# Patient Record
Sex: Male | Born: 1975 | Race: White | Hispanic: No | Smoking: Current every day smoker
Health system: Southern US, Community
[De-identification: ages and names within clinical notes are randomized; demographics above are authoritative.]

## PROBLEM LIST (undated history)

## (undated) DIAGNOSIS — R519 Headache, unspecified: Secondary | ICD-10-CM

## (undated) DIAGNOSIS — F419 Anxiety disorder, unspecified: Secondary | ICD-10-CM

## (undated) DIAGNOSIS — F329 Major depressive disorder, single episode, unspecified: Secondary | ICD-10-CM

## (undated) DIAGNOSIS — F32A Depression, unspecified: Secondary | ICD-10-CM

## (undated) DIAGNOSIS — M542 Cervicalgia: Secondary | ICD-10-CM

## (undated) DIAGNOSIS — R51 Headache: Secondary | ICD-10-CM

## (undated) HISTORY — DX: Anxiety disorder, unspecified: F41.9

## (undated) HISTORY — PX: ORIF CARPAL BONE FRACTURE: SUR922

## (undated) HISTORY — PX: TONSILLECTOMY AND ADENOIDECTOMY: SUR1326

## (undated) HISTORY — DX: Depression, unspecified: F32.A

## (undated) HISTORY — DX: Headache, unspecified: R51.9

## (undated) HISTORY — DX: Headache: R51

## (undated) HISTORY — DX: Cervicalgia: M54.2

## (undated) HISTORY — PX: MELANOMA EXCISION: SHX5266

## (undated) HISTORY — DX: Major depressive disorder, single episode, unspecified: F32.9

---

## 1999-06-22 ENCOUNTER — Encounter: Payer: Self-pay | Admitting: Emergency Medicine

## 1999-06-22 ENCOUNTER — Emergency Department (HOSPITAL_COMMUNITY): Admission: EM | Admit: 1999-06-22 | Discharge: 1999-06-22 | Payer: Self-pay | Admitting: Emergency Medicine

## 1999-10-18 ENCOUNTER — Emergency Department (HOSPITAL_COMMUNITY): Admission: EM | Admit: 1999-10-18 | Discharge: 1999-10-19 | Payer: Self-pay | Admitting: Internal Medicine

## 1999-10-18 ENCOUNTER — Encounter: Payer: Self-pay | Admitting: Internal Medicine

## 1999-10-21 ENCOUNTER — Ambulatory Visit (HOSPITAL_BASED_OUTPATIENT_CLINIC_OR_DEPARTMENT_OTHER): Admission: RE | Admit: 1999-10-21 | Discharge: 1999-10-21 | Payer: Self-pay | Admitting: Orthopedic Surgery

## 2000-03-27 ENCOUNTER — Emergency Department (HOSPITAL_COMMUNITY): Admission: EM | Admit: 2000-03-27 | Discharge: 2000-03-27 | Payer: Self-pay | Admitting: Emergency Medicine

## 2005-09-08 ENCOUNTER — Emergency Department: Payer: Self-pay | Admitting: Unknown Physician Specialty

## 2010-10-01 ENCOUNTER — Emergency Department: Payer: Self-pay | Admitting: Unknown Physician Specialty

## 2010-10-01 ENCOUNTER — Ambulatory Visit: Payer: Self-pay | Admitting: Family Medicine

## 2012-10-18 ENCOUNTER — Ambulatory Visit: Payer: Self-pay | Admitting: Family Medicine

## 2014-02-19 ENCOUNTER — Ambulatory Visit: Payer: Self-pay | Admitting: Family Medicine

## 2014-02-19 LAB — DOT URINE DIP
Blood: NEGATIVE
Glucose,UR: NEGATIVE mg/dL (ref 0–75)
Protein: NEGATIVE
Specific Gravity: 1.01 (ref 1.003–1.030)

## 2015-05-01 ENCOUNTER — Telehealth: Payer: Self-pay

## 2015-05-01 ENCOUNTER — Other Ambulatory Visit: Payer: Self-pay

## 2015-05-01 DIAGNOSIS — F419 Anxiety disorder, unspecified: Secondary | ICD-10-CM

## 2015-05-01 MED ORDER — FLUOXETINE HCL 10 MG PO CAPS: 10.0000 mg | ORAL_CAPSULE | Freq: Every day | ORAL | Status: DC

## 2015-05-01 MED ORDER — FLUOXETINE HCL 20 MG PO TABS: 20.0000 mg | ORAL_TABLET | Freq: Every day | ORAL | Status: DC

## 2015-05-01 MED ORDER — FLUOXETINE HCL 20 MG PO CAPS: 20.0000 mg | ORAL_CAPSULE | Freq: Every day | ORAL | Status: DC

## 2015-05-01 NOTE — Telephone Encounter (Signed)
Patient Last OV 07/2013. Had Fluoxetine Refill 05/2014 with 2 refills attached by Dr. Juanetta Gosling. He now wants refill again. I advised he needs OV as we have not seen him in almost 2 years. He said he can come next week but needs Rx because he is out.o said he has no reason to follow up. I advised that we do need to follow up regular for this issue and medication. He would like Dr to consider 1 month of refill.[facility]

## 2015-05-01 NOTE — Telephone Encounter (Signed)
Sent in  and 

## 2015-05-01 NOTE — Telephone Encounter (Signed)
Send in 14 Fluoxetine tabs at the dose he is currently taking (? 20 mg).  I need to see him within the next 2 weeks.  No further refills until seen.-jh

## 2015-05-09 ENCOUNTER — Encounter: Payer: Self-pay | Admitting: Family Medicine

## 2015-05-12 ENCOUNTER — Encounter: Payer: Self-pay | Admitting: Family Medicine

## 2015-05-12 ENCOUNTER — Ambulatory Visit (INDEPENDENT_AMBULATORY_CARE_PROVIDER_SITE_OTHER): Payer: BLUE CROSS/BLUE SHIELD | Admitting: Family Medicine

## 2015-05-12 VITALS — BP 145/92 | HR 74 | Temp 98.3°F | Resp 16 | Ht 71.5 in | Wt 181.0 lb

## 2015-05-12 DIAGNOSIS — F32A Depression, unspecified: Secondary | ICD-10-CM

## 2015-05-12 DIAGNOSIS — F329 Major depressive disorder, single episode, unspecified: Secondary | ICD-10-CM

## 2015-05-12 DIAGNOSIS — L600 Ingrowing nail: Secondary | ICD-10-CM

## 2015-05-12 DIAGNOSIS — F419 Anxiety disorder, unspecified: Secondary | ICD-10-CM | POA: Diagnosis not present

## 2015-05-12 MED ORDER — FLUOXETINE HCL 20 MG PO CAPS: 20.0000 mg | ORAL_CAPSULE | Freq: Every day | ORAL | Status: DC

## 2015-05-12 NOTE — Patient Instructions (Signed)
Soak R. Great toe in warm water with Epsom salts 1-2 times a day and keep nail trimmed as close as possible.  WEill try loweer dose of Prozac as he is doin g so well.

## 2015-05-12 NOTE — Progress Notes (Signed)
Name: Joshua Leblanc.   MRN: [id number]    DOB: 06-16-1976   Date:05/12/2015       Progress Note  Subjective  Chief Complaint  Chief Complaint  Patient presents with  . Anxiety    HPI  For f/u of anxiety with some depressive component.  Hs not been see here for 22 mos.  Was getting Rx fill for entire time until. Last week with limited suppl;y until office visit.   Feels great on 30 mg. Prozac/day.  Separated and going towards divorce.  He has not been on lower dose since he was placed on 30 mg.  Feels 7-8/10.   C/o a recurrent ingrown toenail on R great toe.  No problem-specific assessment & plan notes found for this encounter.   Past Medical History  Diagnosis Date  . Depression   . Anxiety   . Headache   . Neck pain     Social History  Substance Use Topics  . Smoking status: Current Every Day Smoker -- 1.00 packs/day for 15 years    Types: Cigarettes  . Smokeless tobacco: Never Used  . Alcohol Use: 0.0 oz/week    0 Standard drinks or equivalent per week     Comment: OCCASIONAL     Current outpatient prescriptions:  .  FLUoxetine (PROZAC) 10 MG capsule, Take 1 capsule (10 mg total) by mouth daily., Disp: 14 capsule, Rfl: 0 .  FLUoxetine (PROZAC) 20 MG capsule, Take 1 capsule (20 mg total) by mouth daily., Disp: 14 capsule, Rfl: 0  Allergies  Allergen Reactions  . Codeine Nausea Only  . Xanax [Alprazolam]     Black out and memory lost    Review of Systems  Constitutional: Negative for fever, chills, weight loss and malaise/fatigue.  HENT: Negative for hearing loss.   Eyes: Negative for blurred vision and double vision.  Respiratory: Negative for cough, sputum production, shortness of breath and wheezing.   Cardiovascular: Negative for chest pain, palpitations, orthopnea and leg swelling.  Gastrointestinal: Negative for heartburn, abdominal pain and blood in stool.  Genitourinary: Negative for dysuria, urgency and frequency.  Musculoskeletal: Negative  for myalgias and joint pain.  Skin: Negative for rash.  Neurological: Negative for dizziness, sensory change, focal weakness, weakness and headaches.  Psychiatric/Behavioral: Negative for depression. The patient is not nervous/anxious and does not have insomnia.       Objective  Filed Vitals:   05/12/15 1552  BP: 145/92  Pulse: 74  Temp: 98.3 F (36.8 C)  TempSrc: Oral  Resp: 16  Height: 5' 11.5" (1.816 m)  Weight: 181 lb (82.101 kg)     Physical Exam  Constitutional: He is well-developed, well-nourished, and in no distress. No distress.  HENT:  Head: Normocephalic and atraumatic.  Eyes: Conjunctivae and EOM are normal. Pupils are equal, round, and reactive to light. No scleral icterus.  Neck: Normal range of motion. Neck supple. No thyromegaly present.  Cardiovascular: Normal rate, regular rhythm, normal heart sounds and intact distal pulses.  Exam reveals no gallop and no friction rub.   No murmur heard. Pulmonary/Chest: Effort normal and breath sounds normal. No respiratory distress. He has no wheezes. He has no rales.  Abdominal: Soft. Bowel sounds are normal. He exhibits no distension and no mass. There is no tenderness.  Lymphadenopathy:    He has no cervical adenopathy.  Skin:  Ingrown toenail of R. Great toe without infection.  Psychiatric:  Affect very good.  Feels overall 7-8 / 10.  Vitals reviewed.  No results found for this or any previous visit (from the past 2160 hour(s)).   Assessment & Plan  1. Acute anxiety  - FLUoxetine (PROZAC) 20 MG capsule; Take 1 capsule (20 mg total) by mouth daily.  Dispense: 30 capsule; Refill: 6  2. Depression  - FLUoxetine (PROZAC) 20 MG capsule; Take 1 capsule (20 mg total) by mouth daily.  Dispense: 30 capsule; Refill: 6  3. Ingrown right greater toenail  - Ambulatory referral to Podiatry

## 2015-06-01 ENCOUNTER — Encounter: Payer: Self-pay | Admitting: Emergency Medicine

## 2015-06-01 ENCOUNTER — Emergency Department
Admission: EM | Admit: 2015-06-01 | Discharge: 2015-06-01 | Disposition: A | Discharge status: Home / Self Care | Payer: BLUE CROSS/BLUE SHIELD | Attending: Emergency Medicine | Admitting: Emergency Medicine

## 2015-06-01 ENCOUNTER — Emergency Department: Payer: BLUE CROSS/BLUE SHIELD

## 2015-06-01 DIAGNOSIS — Z79899 Other long term (current) drug therapy: Secondary | ICD-10-CM | POA: Insufficient documentation

## 2015-06-01 DIAGNOSIS — Z720 Tobacco use: Secondary | ICD-10-CM | POA: Insufficient documentation

## 2015-06-01 DIAGNOSIS — Y998 Other external cause status: Secondary | ICD-10-CM | POA: Insufficient documentation

## 2015-06-01 DIAGNOSIS — Y9371 Activity, boxing: Secondary | ICD-10-CM | POA: Diagnosis not present

## 2015-06-01 DIAGNOSIS — Y9289 Other specified places as the place of occurrence of the external cause: Secondary | ICD-10-CM | POA: Diagnosis not present

## 2015-06-01 DIAGNOSIS — W500XXA Accidental hit or strike by another person, initial encounter: Secondary | ICD-10-CM | POA: Diagnosis not present

## 2015-06-01 DIAGNOSIS — S6991XA Unspecified injury of right wrist, hand and finger(s), initial encounter: Secondary | ICD-10-CM | POA: Diagnosis present

## 2015-06-01 DIAGNOSIS — S62326A Displaced fracture of shaft of fifth metacarpal bone, right hand, initial encounter for closed fracture: Secondary | ICD-10-CM | POA: Diagnosis not present

## 2015-06-01 DIAGNOSIS — S62309A Unspecified fracture of unspecified metacarpal bone, initial encounter for closed fracture: Secondary | ICD-10-CM

## 2015-06-01 MED ORDER — OXYCODONE-ACETAMINOPHEN 5-325 MG PO TABS
1.0000 | ORAL_TABLET | Freq: Four times a day (QID) | ORAL | Status: DC | PRN
Start: 2015-06-01 — End: 2015-09-25

## 2015-06-01 MED ORDER — IBUPROFEN 800 MG PO TABS: 800.0000 mg | ORAL_TABLET | Freq: Three times a day (TID) | ORAL | Status: DC | PRN

## 2015-06-01 NOTE — ED Notes (Signed)
Pt states was horse playing last night when he punched him in the back. Woke up today with swelling and pain to R hand.

## 2015-06-01 NOTE — ED Provider Notes (Signed)
[facility] Emergency Department Provider Note ____________________________________________  Time seen: Approximately 12:39 PM  I have reviewed the triage vital signs and the nursing notes.   HISTORY  Chief Complaint Hand Injury   HPI Joshua Leblanc. is a 39 y.o. male who presents to the emergency department for evaluation of right hand pain. He and his little brother were "slap boxing" and he now has pain in his right hand. He had a previous boxer's fracture about "18 years ago" and had a metal plate inserted. He states they also "took out 2 of my knuckles." He has not taken anything for pain today.   Past Medical History  Diagnosis Date  . Depression   . Anxiety   . Headache   . Neck pain     There are no active problems to display for this patient.   Past Surgical History  Procedure Laterality Date  . Melanoma excision    . Orif carpal bone fracture      Current Outpatient Rx  Name  Route  Sig  Dispense  Refill  . FLUoxetine (PROZAC) 20 MG capsule   Oral   Take 1 capsule (20 mg total) by mouth daily.   30 capsule   6   . ibuprofen (ADVIL,MOTRIN) 800 MG tablet   Oral   Take 1 tablet (800 mg total) by mouth every 8 (eight) hours as needed.   30 tablet   0   . oxyCODONE-acetaminophen (ROXICET) 5-325 MG tablet   Oral   Take 1 tablet by mouth every 6 (six) hours as needed.   12 tablet   0     Allergies Codeine and Xanax  Family History  Problem Relation Age of Onset  . Hypertension Mother   . Cancer Father     melanoma    Social History Social History  Substance Use Topics  . Smoking status: Current Every Day Smoker -- 1.00 packs/day for 15 years    Types: Cigarettes  . Smokeless tobacco: Never Used  . Alcohol Use: 0.0 oz/week    0 Standard drinks or equivalent per week     Comment: OCCASIONAL    Review of Systems Constitutional: No recent illness. Eyes: No visual changes. ENT: No sore  throat. Cardiovascular: Denies chest pain or palpitations. Respiratory: Denies shortness of breath. Gastrointestinal: No abdominal pain.  Genitourinary: Negative for dysuria. Musculoskeletal: Pain in right hand Skin: Negative for rash. Neurological: Negative for headaches, focal weakness or numbness. 10-point ROS otherwise negative.  ____________________________________________   PHYSICAL EXAM:  VITAL SIGNS: ED Triage Vitals  Enc Vitals Group     BP 06/01/15 1228 153/91 mmHg     Pulse Rate 06/01/15 1228 87     Resp 06/01/15 1228 18     Temp 06/01/15 1228 98.2 F (36.8 C)     Temp Source 06/01/15 1228 Oral     SpO2 06/01/15 1228 95 %     Weight 06/01/15 1228 182 lb (82.555 kg)     Height 06/01/15 1228  (1.803 m)     Head Cir --      Peak Flow --      Pain Score 06/01/15 1229 8     Pain Loc --      Pain Edu? --      Excl. in GC? --     Constitutional: Alert and oriented. Well appearing and in no acute distress. Eyes: Conjunctivae are normal. EOMI. Head: Atraumatic. Nose: No congestion/rhinnorhea. Neck: No stridor.  Respiratory: Normal respiratory effort.   Musculoskeletal: Deformity noted to the right 5th metacarpal area. Patient now unable to extend 5th digit straight out as before last night's injury.  Neurologic:  Normal speech and language. No gross focal neurologic deficits are appreciated. Speech is normal. No gait instability. Skin:  Skin is warm, dry and intact. Contusion noted to the lateral aspect of the right hand and palmar aspect of the right hand. Psychiatric: Mood and affect are normal. Speech and behavior are normal.  ____________________________________________   LABS (all labs ordered are listed, but only abnormal results are displayed)  Labs Reviewed - No data to display ____________________________________________  RADIOLOGY  Fracture of the proximal head of the 5th  metacarpal as well as the proximal shaft on the right hand.  I, Kem Boroughsari  Dacota Ruben, personally viewed and evaluated these images (plain radiographs) as part of my medical decision making.   ____________________________________________   PROCEDURES  Procedure(s) performed:   SPLINT APPLICATION Date/Time: 2:01 PM Authorized by: Kem Boroughsari Keagen Heinlen Consent: Verbal consent obtained. Risks and benefits: risks, benefits and alternatives were discussed Consent given by: patient Splint applied by: Mellody DanceKeith, ER technician Location details: right hand Splint type: Boxer's  Supplies used: OCL and ACE Post-procedure: The splinted body part was neurovascularly unchanged following the procedure. Patient tolerance: Patient tolerated the procedure well with no immediate complications.      ____________________________________________   INITIAL IMPRESSION / ASSESSMENT AND PLAN / ED COURSE  Pertinent labs & imaging results that were available during my care of the patient were reviewed by me and considered in my medical decision making (see chart for details).  Patient was advised to follow up with orthopedics .Marland Kitchen. He was advised to return to the emergency department for symptoms that change or worsen if unable to schedule an appointment with the primary care provider or specialist. ____________________________________________   FINAL CLINICAL IMPRESSION(S) / ED DIAGNOSES  Final diagnoses:  Metacarpal bone fracture, closed, initial encounter       Chinita PesterCari B Shadi Sessler, FNP 06/01/15 1401  Governor Rooksebecca Lord, MD 06/01/15 1536

## 2015-07-21 ENCOUNTER — Telehealth: Payer: Self-pay | Admitting: Family Medicine

## 2015-07-21 NOTE — Telephone Encounter (Signed)
Patient informed that he would need an office visit to discuss increase of medication. Patient states he is going out of town and will call back to schedule.

## 2015-07-21 NOTE — Telephone Encounter (Signed)
Pt called requesting that we  Increase his fluoxetine to 30 mg , [facility]  [place]   Pt  Call back  # is [phone].

## 2015-07-21 NOTE — Telephone Encounter (Signed)
I would need to see him if he thinks he needs a higher dose of this medicine.-jh

## 2015-08-02 ENCOUNTER — Other Ambulatory Visit: Payer: Self-pay | Admitting: Family Medicine

## 2015-08-28 ENCOUNTER — Ambulatory Visit: Payer: BLUE CROSS/BLUE SHIELD | Admitting: Family Medicine

## 2015-09-01 ENCOUNTER — Ambulatory Visit: Payer: BLUE CROSS/BLUE SHIELD | Admitting: Family Medicine

## 2015-09-25 ENCOUNTER — Encounter: Payer: Self-pay | Admitting: Family Medicine

## 2015-09-25 ENCOUNTER — Ambulatory Visit (INDEPENDENT_AMBULATORY_CARE_PROVIDER_SITE_OTHER): Payer: BLUE CROSS/BLUE SHIELD | Admitting: Family Medicine

## 2015-09-25 VITALS — BP 132/86 | HR 59 | Temp 98.0°F | Resp 16 | Wt 180.0 lb

## 2015-09-25 DIAGNOSIS — F411 Generalized anxiety disorder: Secondary | ICD-10-CM

## 2015-09-25 MED ORDER — FLUOXETINE HCL 20 MG PO TABS: 20.0000 mg | ORAL_TABLET | Freq: Every day | ORAL | Status: DC

## 2015-09-25 MED ORDER — FLUOXETINE HCL 10 MG PO CAPS
ORAL_CAPSULE | ORAL | Status: DC
Start: 2015-09-25 — End: 2023-01-06

## 2015-09-25 NOTE — Progress Notes (Signed)
Name: Joshua Leblanc [facility].   MRN: [id number]    DOB: Dec 13, 1975   Date:09/25/2015       Progress Note  Subjective  Chief Complaint  Chief Complaint  Patient presents with  . Follow-up    Medications  . Anxiety    HPI Here for f/o of anxiety and agitation.   Has been on lower dose of Prozac (20 mg now) for 3-4 n months..,  Has been more anxious and agitated since we went down from 30 mg.  He did much  better on 30 mg/d.   No problem-specific assessment & plan notes found for this encounter.   Past Medical History  Diagnosis Date  . Depression   . Anxiety   . Headache   . Neck pain     Past Surgical History  Procedure Laterality Date  . Melanoma excision    . Orif carpal bone fracture      Family History  Problem Relation Age of Onset  . Hypertension Mother   . Cancer Father     melanoma    Social History   Social History  . Marital Status: Single    Spouse Name: N/A  . Number of Children: N/A  . Years of Education: N/A   Occupational History  . Not on file.   Social History Main Topics  . Smoking status: Current Every Day Smoker -- 1.00 packs/day for 15 years    Types: Cigarettes  . Smokeless tobacco: Never Used  . Alcohol Use: 0.0 oz/week    0 Standard drinks or equivalent per week     Comment: OCCASIONAL  . Drug Use: No     Comment: FORMER X15 AGO  . Sexual Activity: Not on file     Comment: I   Other Topics Concern  . Not on file   Social History Narrative     Current outpatient prescriptions:  .  FLUoxetine (PROZAC) 20 MG tablet, Take 1 tablet (20 mg total) by mouth daily., Disp: 90 tablet, Rfl: 3 .  FLUoxetine (PROZAC) 10 MG capsule, Take 1 capsule daily, Disp: 90 capsule, Rfl: 3  Allergies  Allergen Reactions  . Codeine Nausea Only  . Xanax [Alprazolam]     Black out and memory lost     Review of Systems  Constitutional: Negative for fever, chills, weight loss and malaise/fatigue.  HENT: Negative for hearing loss.   Eyes:  Negative for blurred vision and double vision.  Respiratory: Negative for cough, shortness of breath and wheezing.   Cardiovascular: Negative for chest pain, palpitations and leg swelling.  Gastrointestinal: Negative for heartburn, abdominal pain and blood in stool.  Genitourinary: Negative for dysuria, urgency and frequency.  Skin: Negative for rash.  Neurological: Negative for weakness and headaches.  Psychiatric/Behavioral: The patient is nervous/anxious (increased agitation).       Objective  Filed Vitals:   09/25/15 1559  BP: 132/86  Pulse: 59  Temp: 98 F (36.7 C)  TempSrc: Oral  Resp: 16  Weight: 180 lb (81.647 kg)    Physical Exam  Constitutional: He is oriented to person, place, and time and well-developed, well-nourished, and in no distress. No distress.  HENT:  Head: Normocephalic and atraumatic.  Eyes: Conjunctivae and EOM are normal. Pupils are equal, round, and reactive to light. No scleral icterus.  Neck: Normal range of motion. Neck supple. Carotid bruit is not present. No thyromegaly present.  Cardiovascular: Normal rate, regular rhythm and normal heart sounds.  Exam reveals no gallop and no  friction rub.   No murmur heard. Pulmonary/Chest: Effort normal and breath sounds normal. No respiratory distress. He has no wheezes. He has no rales.  Abdominal: Soft. Bowel sounds are normal. He exhibits no distension and no mass. There is no tenderness.  Musculoskeletal: He exhibits no edema.  Lymphadenopathy:    He has no cervical adenopathy.  Neurological: He is alert and oriented to person, place, and time.  Psychiatric:  Affect is mildly anxious  Vitals reviewed.      No results found for this or any previous visit (from the past 2160 hour(s)).   Assessment & Plan  Problem List Items Addressed This Visit      Other   Anxiety, generalized - Primary   Relevant Medications   FLUoxetine (PROZAC) 20 MG tablet   FLUoxetine (PROZAC) 10 MG capsule       Meds ordered this encounter  Medications  . FLUoxetine (PROZAC) 20 MG tablet    Sig: Take 1 tablet (20 mg total) by mouth daily.    Dispense:  90 tablet    Refill:  3  . FLUoxetine (PROZAC) 10 MG capsule    Sig: Take 1 capsule daily    Dispense:  90 capsule    Refill:  3    Total daily dose is 30 mg/d   1. Anxiety, generalized-with agitation - FLUoxetine (PROZAC) 20 MG tablet; Take 1 tablet (20 mg total) by mouth daily.  Dispense: 90 tablet; Refill: 3 - FLUoxetine (PROZAC) 10 MG capsule; Take 1 capsule daily  Dispense: 90 capsule; Refill: 3

## 2015-12-01 ENCOUNTER — Ambulatory Visit: Payer: BLUE CROSS/BLUE SHIELD | Admitting: Family Medicine

## 2016-01-28 ENCOUNTER — Ambulatory Visit (INDEPENDENT_AMBULATORY_CARE_PROVIDER_SITE_OTHER): Payer: BLUE CROSS/BLUE SHIELD

## 2016-01-28 ENCOUNTER — Encounter: Payer: Self-pay | Admitting: Emergency Medicine

## 2016-01-28 ENCOUNTER — Ambulatory Visit
Admission: EM | Admit: 2016-01-28 | Discharge: 2016-01-28 | Disposition: A | Discharge status: Home / Self Care | Payer: BLUE CROSS/BLUE SHIELD | Attending: Family Medicine | Admitting: Family Medicine

## 2016-01-28 DIAGNOSIS — S92902A Unspecified fracture of left foot, initial encounter for closed fracture: Secondary | ICD-10-CM

## 2016-01-28 MED ORDER — OXYCODONE-ACETAMINOPHEN 5-325 MG PO TABS: 1.0000 | ORAL_TABLET | Freq: Three times a day (TID) | ORAL | Status: DC | PRN

## 2016-01-28 MED ORDER — IBUPROFEN 800 MG PO TABS: 800.0000 mg | ORAL_TABLET | Freq: Three times a day (TID) | ORAL | Status: DC | PRN

## 2016-01-28 NOTE — Discharge Instructions (Signed)
Take medication as prescribed. Rest. Drink plenty of fluids. Apply ice and elevate. Keep in splint and use crutches. Do not drive or operate machinery while taking pain medication.   Follow up podiatry this week as discussed.   Follow up with your primary care physician this week as needed. Return to Urgent care for new or worsening concerns.

## 2016-01-28 NOTE — ED Provider Notes (Signed)
[facility]  ____________________________________________  Time seen: Approximately 8:28 PM  I have reviewed the triage vital signs and the nursing notes.   HISTORY  Chief Complaint Foot Pain   HPI Joshua CanaryRobert Lester Stecklein Leblanc. is a 40 y.o. male presents with a complaint of left foot pain since this morning. Patient reports that he has a few steps going out his front door. Patient states that when he got to about the third step, he missed stepped, causing himself to roll his left foot. Patient states that he did fall but was able to catch himself. Denies any other pain or injury. Denies loss of consciousness or head injury.  Patient reports that he has had continued left foot pain since the injury. Patient states that pain is to the top of his foot and to the inside of his foot. Denies any pain radiation. Denies any numbness or tingling sensation. Patient states pain is worse with ambulating or direct palpation. Reports mild swelling. Denies histories of issues to the same foot.  Denies head injury, loss of consciousness, neck pain or injury, back pain or injury, other extremity pain or injury, dizziness, weakness, recent sickness or recent immobilization.  Joshua LevyJames Hawkins Jr, MD PCP   Past Medical History  Diagnosis Date  . Depression   . Anxiety   . Headache   . Neck pain     Patient Active Problem List   Diagnosis Date Noted  . Anxiety, generalized 09/25/2015    Past Surgical History  Procedure Laterality Date  . Melanoma excision    . Orif carpal bone fracture      Current Outpatient Rx  Name  Route  Sig  Dispense  Refill  . FLUoxetine (PROZAC) 10 MG capsule      Take 1 capsule daily   90 capsule   3     Total daily dose is 30 mg/d   . FLUoxetine (PROZAC) 20 MG tablet   Oral   Take 1 tablet (20 mg total) by mouth daily.   90 tablet   3     Allergies Codeine and Xanax Per patient : Codeine makes him nauseated. Denies any other issues at other pain  medication. Family History  Problem Relation Age of Onset  . Hypertension Mother   . Cancer Father     melanoma    Social History Social History  Substance Use Topics  . Smoking status: Current Every Day Smoker -- 1.00 packs/day for 15 years    Types: Cigarettes  . Smokeless tobacco: Never Used  . Alcohol Use: No     Comment: former    Review of Systems Constitutional: No fever/chills Eyes: No visual changes. ENT: No sore throat. Cardiovascular: Denies chest pain. Respiratory: Denies shortness of breath. Gastrointestinal: No abdominal pain.  No nausea, no vomiting.  No diarrhea.  No constipation. Genitourinary: Negative for dysuria. Musculoskeletal: Negative for back pain. As above.  Skin: Negative for rash. Neurological: Negative for headaches, focal weakness or numbness.  10-point ROS otherwise negative.  ____________________________________________   PHYSICAL EXAM:  VITAL SIGNS: ED Triage Vitals  Enc Vitals Group     BP 01/28/16 1829 132/91 mmHg     Pulse Rate 01/28/16 1829 85     Resp 01/28/16 1829 18     Temp 01/28/16 1829 97.2 F (36.2 C)     Temp Source 01/28/16 1829 Tympanic     SpO2 01/28/16 1829 100 %     Weight 01/28/16 1829 185 lb (83.915 kg)  Height 01/28/16 1829 5\' 11"  (1.803 m)     Head Cir --      Peak Flow --      Pain Score 01/28/16 1828 9     Pain Loc --      Pain Edu? --      Excl. in GC? --     Constitutional: Alert and oriented. Well appearing and in no acute distress. Eyes: Conjunctivae are normal. PERRL. EOMI. Head: Atraumatic.  Ears: normal external appearance bilaterally.   Nose: No congestion/rhinnorhea.  Mouth/Throat: Mucous membranes are moist.  Neck: No stridor.  No cervical spine tenderness to palpation. Cardiovascular: Normal rate, regular rhythm. Grossly normal heart sounds.  Good peripheral circulation. Respiratory: Normal respiratory effort.  No retractions. Lungs CTAB. No wheezes, rales or rhonchi.   Musculoskeletal: No lower or upper extremity tenderness nor edema.  No cervical, thoracic, or lumbar tenderness to palpation. Bilateral pedal pulses equal and easily palpated.  Except: left dorsal midfoot and medial midfoot moderate tenderness to palpation with mild ecchymosis and swelling, full range of motion, sensation intact, neurovascular intact, left lower extremity otherwise nontender, gait not tested due to pain.  Neurologic:  Normal speech and language. No gross focal neurologic deficits are appreciated.  Skin:  Skin is warm, dry and intact. No rash noted. Psychiatric: Mood and affect are normal. Speech and behavior are normal.  ____________________________________________   LABS (all labs ordered are listed, but only abnormal results are displayed)  Labs Reviewed - No data to display  RADIOLOGY  Dg Foot Complete Left  01/28/2016  CLINICAL DATA:  Left medial foot pain with bruising. Stepped down wrong when coming out door this morning. EXAM: LEFT FOOT - COMPLETE 3+ VIEW COMPARISON:  None. FINDINGS: Favor artifactual lucency through the navicula and dorsal aspect of the cuneiform on the lateral view. Not visualized on other images. Otherwise, no fracture dislocation identified. IMPRESSION: Lucencies through the navicula and cuneiforms on the lateral view are favored to be artifactual. Correlate with point tenderness. Electronically Signed   By: Jeronimo Greaves M.D.   On: 01/28/2016 20:37   ____________________________________________   PROCEDURES  Procedure(s) performed:   Crutches given. Posterior OCL left foot splint applied by CMA, neurovascular intact post application. ____________________________________________   INITIAL IMPRESSION / ASSESSMENT AND PLAN / ED COURSE  Pertinent labs & imaging results that were available during my care of the patient were reviewed by me and considered in my medical decision making (see chart for details).  Patient presents for the  complaints of left foot pain post mechanical injury today. Denies any other pain or injury. Reports has continued to ambulate but with pain. Patient with dorsal mid to proximal foot pain and tenderness as well as medial proximal foot tenderness. Will evaluate x-ray.  Per radiologist lucencies through the navicula and cuneiforms in the lateral view are favored to be artifactual, correlate with point tenderness. Patient reexamined and patient is point tender in these areas and reviewed x-ray by myself. Suspect acute fracture. Discussed this with patient who verbalized understanding. Patient placed in posterior OCL splint and given crutches. When necessary ibuprofen and Percocet. 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 Directed do not operate any heavy machinery while taking pain medication. Instructed to apply ice and elevate and use crutches. Patient states he does not want to be taken out of work but states he needed a note stating that he needs to use crutches for the next 2 days. Patient states  that on Monday he he will be able to sit down at work. Patient directed to follow-up with podiatry in the next week.  Patient also requests information for orthopedic as he states that when he was approximately 40 years old he had a boxer's fracture that was repaired with hardware. Patient states that he is concerned that he may have a screw that is backing out of the hardware and request a follow-up with orthopedic. Patient declines wanting this complaint evaluated in urgent care at this time and declines pain associated with this. Information for Dr. Joice Lofts orthopedic given.  Discussed follow up with Primary care physician this week. Discussed follow up and return parameters including no resolution or any worsening concerns. Patient verbalized understanding and agreed to plan.   ____________________________________________   FINAL CLINICAL IMPRESSION(S) / ED DIAGNOSES  Final  diagnoses:  Foot fracture, left, closed, initial encounter     Discharge Medication List as of 01/28/2016  9:27 PM    START taking these medications   Details  ibuprofen (ADVIL,MOTRIN) 800 MG tablet Take 1 tablet (800 mg total) by mouth every 8 (eight) hours as needed for mild pain or moderate pain., Starting 01/28/2016, Until Discontinued, Print    oxyCODONE-acetaminophen (ROXICET) 5-325 MG tablet Take 1 tablet by mouth every 8 (eight) hours as needed for moderate pain or severe pain (Do not drive or operate heavy machinery while taking as can cause drowsiness.)., Starting 01/28/2016, Until Discontinued, Print        Note: This dictation was prepared with Dragon dictation along with smaller phrase technology. Any transcriptional errors that result from this process are unintentional.       Renford Dills, NP 01/29/16 2219

## 2016-01-28 NOTE — ED Notes (Signed)
Left foot pain, Stepped down wrong when coming out door.

## 2017-05-01 ENCOUNTER — Emergency Department: Payer: Medicaid Other

## 2017-05-01 ENCOUNTER — Emergency Department
Admission: EM | Admit: 2017-05-01 | Discharge: 2017-05-01 | Disposition: A | Discharge status: Home / Self Care | Payer: Medicaid Other | Attending: Emergency Medicine | Admitting: Emergency Medicine

## 2017-05-01 ENCOUNTER — Encounter: Payer: Self-pay | Admitting: Emergency Medicine

## 2017-05-01 DIAGNOSIS — Y929 Unspecified place or not applicable: Secondary | ICD-10-CM | POA: Diagnosis not present

## 2017-05-01 DIAGNOSIS — Z79899 Other long term (current) drug therapy: Secondary | ICD-10-CM | POA: Insufficient documentation

## 2017-05-01 DIAGNOSIS — Y9389 Activity, other specified: Secondary | ICD-10-CM | POA: Diagnosis not present

## 2017-05-01 DIAGNOSIS — S91111A Laceration without foreign body of right great toe without damage to nail, initial encounter: Secondary | ICD-10-CM

## 2017-05-01 DIAGNOSIS — S9031XA Contusion of right foot, initial encounter: Secondary | ICD-10-CM | POA: Diagnosis not present

## 2017-05-01 DIAGNOSIS — Y999 Unspecified external cause status: Secondary | ICD-10-CM | POA: Insufficient documentation

## 2017-05-01 DIAGNOSIS — W230XXA Caught, crushed, jammed, or pinched between moving objects, initial encounter: Secondary | ICD-10-CM | POA: Insufficient documentation

## 2017-05-01 DIAGNOSIS — S99921A Unspecified injury of right foot, initial encounter: Secondary | ICD-10-CM | POA: Diagnosis present

## 2017-05-01 DIAGNOSIS — Z23 Encounter for immunization: Secondary | ICD-10-CM | POA: Insufficient documentation

## 2017-05-01 DIAGNOSIS — F17210 Nicotine dependence, cigarettes, uncomplicated: Secondary | ICD-10-CM | POA: Diagnosis not present

## 2017-05-01 DIAGNOSIS — S91211A Laceration without foreign body of right great toe with damage to nail, initial encounter: Secondary | ICD-10-CM | POA: Insufficient documentation

## 2017-05-01 MED ORDER — CEPHALEXIN 500 MG PO CAPS: 500.0000 mg | ORAL_CAPSULE | Freq: Four times a day (QID) | ORAL | 0 refills | Status: DC

## 2017-05-01 MED ORDER — MELOXICAM 15 MG PO TABS: 15.0000 mg | ORAL_TABLET | Freq: Every day | ORAL | 0 refills | Status: DC

## 2017-05-01 MED ORDER — LIDOCAINE HCL (PF) 1 % IJ SOLN
15.0000 mL | Freq: Once | INTRAMUSCULAR | Status: AC
  Administered 2017-05-01: 15 mL
  Filled 2017-05-01: qty 15

## 2017-05-01 MED ORDER — OXYCODONE-ACETAMINOPHEN 5-325 MG PO TABS
1.0000 | ORAL_TABLET | Freq: Once | ORAL | Status: AC
  Administered 2017-05-01: 1 via ORAL
  Filled 2017-05-01: qty 1

## 2017-05-01 MED ORDER — ONDANSETRON 8 MG PO TBDP
8.0000 mg | ORAL_TABLET | Freq: Once | ORAL | Status: AC
  Administered 2017-05-01: 8 mg via ORAL
  Filled 2017-05-01: qty 1

## 2017-05-01 MED ORDER — HYDROCODONE-ACETAMINOPHEN 5-325 MG PO TABS: 1.0000 | ORAL_TABLET | ORAL | 0 refills | Status: DC | PRN

## 2017-05-01 MED ORDER — TETANUS-DIPHTH-ACELL PERTUSSIS 5-2.5-18.5 LF-MCG/0.5 IM SUSP
0.5000 mL | Freq: Once | INTRAMUSCULAR | Status: AC
  Administered 2017-05-01: 0.5 mL via INTRAMUSCULAR
  Filled 2017-05-01: qty 0.5

## 2017-05-01 NOTE — ED Notes (Signed)
Right great toe cleansed with NS, gauze applied and wrapped in Kerlex.  Pt given referral to follow up with PCP for suture removal. Discussed wound care. Pt verbalized understanding.  Pt ambulatory to lobby without difficulty.

## 2017-05-01 NOTE — ED Notes (Signed)
Pt reports that he injured his right great toe - pt states that the toenail was pulled off and he reports that he can see the bone in right great toe - pt injured it while moving a wood stove today

## 2017-05-01 NOTE — ED Triage Notes (Signed)
Patient presents to ED via POV from home. Patient states he was moving a wood burning stove when he dropped it on his toe. Patient states it weighed about 300 pounds. Patient talking on phone during triage.

## 2017-05-01 NOTE — ED Provider Notes (Signed)
[facility] Emergency Department Provider Note  ____________________________________________  Time seen: Approximately 4:52 PM  I have reviewed the triage vital signs and the nursing notes.   HISTORY  Chief Complaint Toe Injury    HPI Joshua Leblanc. is a 41 y.o. male who presents to emergency department complaining of great toe injury to the right foot. Patient reports that he was moving a heavy wood burning stove when he accidentally dropped him on his foot. Patient reports that he is wearing flip-flops and sustained a laceration as well as small toenail avulsion to the great toe right foot. Patient wrapped area,presented to the emergency department. No medications prior to arrival. Last tetanus shot was greater an 10 years ago. No other injury or complaint at this time.   Past Medical History:  Diagnosis Date  . Anxiety   . Depression   . Headache   . Neck pain     Patient Active Problem List   Diagnosis Date Noted  . Anxiety, generalized 09/25/2015    Past Surgical History:  Procedure Laterality Date  . MELANOMA EXCISION    . ORIF CARPAL BONE FRACTURE      Prior to Admission medications   Medication Sig Start Date End Date Taking? Authorizing Provider  cephALEXin (KEFLEX) 500 MG capsule Take 1 capsule (500 mg total) by mouth 4 (four) times daily. 05/01/17   Cuthriell, Delorise Royals, PA-C  FLUoxetine (PROZAC) 10 MG capsule Take 1 capsule daily 09/25/15   Janeann Forehand., MD  FLUoxetine (PROZAC) 20 MG tablet Take 1 tablet (20 mg total) by mouth daily. 09/25/15   Janeann Forehand., MD  HYDROcodone-acetaminophen (NORCO/VICODIN) 5-325 MG tablet Take 1 tablet by mouth every 4 (four) hours as needed for moderate pain. 05/01/17   Cuthriell, Delorise Royals, PA-C  ibuprofen (ADVIL,MOTRIN) 800 MG tablet Take 1 tablet (800 mg total) by mouth every 8 (eight) hours as needed for mild pain or moderate pain. 01/28/16   Renford Dills, NP  meloxicam  (MOBIC) 15 MG tablet Take 1 tablet (15 mg total) by mouth daily. 05/01/17   Cuthriell, Delorise Royals, PA-C  oxyCODONE-acetaminophen (ROXICET) 5-325 MG tablet Take 1 tablet by mouth every 8 (eight) hours as needed for moderate pain or severe pain (Do not drive or operate heavy machinery while taking as can cause drowsiness.). 01/28/16   Renford Dills, NP    Allergies Codeine and Xanax [alprazolam]  Family History  Problem Relation Age of Onset  . Hypertension Mother   . Cancer Father        melanoma    Social History Social History  Substance Use Topics  . Smoking status: Current Every Day Smoker    Packs/day: 1.00    Years: 15.00    Types: Cigarettes  . Smokeless tobacco: Never Used  . Alcohol use No     Comment: former     Review of Systems  Constitutional: No fever/chills Cardiovascular: no chest pain. Respiratory: no cough. No SOB. Musculoskeletal: positive for injury to the great toe of the right foot Skin: Negative for rash, abrasions, lacerations, ecchymosis. Neurological: Negative for headaches, focal weakness or numbness. 10-point ROS otherwise negative.  ____________________________________________   PHYSICAL EXAM:  VITAL SIGNS: ED Triage Vitals  Enc Vitals Group     BP 05/01/17 1645 (!) 142/97     Pulse Rate 05/01/17 1644 (!) 108     Resp 05/01/17 1644 (!) 22     Temp 05/01/17 1644 98.2 F (36.8 C)  Temp src --      SpO2 05/01/17 1644 98 %     Weight 05/01/17 1644 185 lb (83.9 kg)     Height 05/01/17 1644  (1.778 m)     Head Circumference --      Peak Flow --      Pain Score 05/01/17 1651 9     Pain Loc --      Pain Edu? --      Excl. in GC? --      Constitutional: Alert and oriented. Well appearing and in no acute distress. Eyes: Conjunctivae are normal. PERRL. EOMI. Head: Atraumatic. Neck: No stridor.    Cardiovascular: Normal rate, regular rhythm. Normal S1 and S2.  Good peripheral circulation. Respiratory: Normal respiratory  effort without tachypnea or retractions. Lungs CTAB. Good air entry to the bases with no decreased or absent breath sounds. Musculoskeletal: Full range of motion to all extremities. No gross deformities appreciated.no gross deformity or amputation. 5 centimeter laceration noted to the MTP joint of the first digit dorsal aspect of the foot. Small laceration noted to the nail bed toenail.toenail still in place. No gross avulsion. Patient is tender to palpation along the metatarsal bone as well as both phalanxes of the great toe.no significant palpable abnormality. Good range of motion to the toe. Cap refill and sensation intact Neurologic:  Normal speech and language. No gross focal neurologic deficits are appreciated.  Skin:  Skin is warm, dry and intact. No rash noted. Psychiatric: Mood and affect are normal. Speech and behavior are normal. Patient exhibits appropriate insight and judgement.   ____________________________________________   LABS (all labs ordered are listed, but only abnormal results are displayed)  Labs Reviewed - No data to display ____________________________________________  EKG   ____________________________________________  RADIOLOGY Festus Barren Cuthriell, personally viewed and evaluated these images (plain radiographs) as part of my medical decision making, as well as reviewing the written report by the radiologist.  Dg Foot Complete Right  Result Date: 05/01/2017 CLINICAL DATA:  Patient reports he was attempting to move a wood stove today when it landed on his right foot. Reports stove fell primarily on big toe. Patient has laceration to dorsal surface of big toe. Hx removal of ingrown toe nail to big toe approx. 2 years ago, remote hx of right ankle fx. EXAM: RIGHT FOOT COMPLETE - 3+ VIEW COMPARISON:  None. FINDINGS: There is no evidence of fracture or dislocation. There is no evidence of arthropathy or other focal bone abnormality. Soft tissues are unremarkable.  IMPRESSION: Negative. Electronically Signed   By: Amie Portland M.D.   On: 05/01/2017 17:21    ____________________________________________    PROCEDURES  Procedure(s) performed:    Marland KitchenMarland KitchenLaceration Repair Date/Time: 05/01/2017 6:19 PM Performed by: Gala Romney D Authorized by: Gala Romney D   Consent:    Consent obtained:  Verbal   Consent given by:  Patient   Risks discussed:  Pain Anesthesia (see MAR for exact dosages):    Anesthesia method:  Local infiltration   Local anesthetic:  Lidocaine 1% w/o epi Laceration details:    Location:  Toe   Toe location:  R big toe   Length (cm):  5 Repair type:    Repair type:  Simple Pre-procedure details:    Preparation:  Imaging obtained to evaluate for foreign bodies Exploration:    Hemostasis achieved with:  Direct pressure   Wound exploration: wound explored through full range of motion and entire depth of wound probed and  visualized     Wound extent: no foreign bodies/material noted, no muscle damage noted, no nerve damage noted, no tendon damage noted, no underlying fracture noted and no vascular damage noted     Contaminated: no   Treatment:    Area cleansed with:  Betadine   Amount of cleaning:  Extensive   Irrigation solution:  Sterile saline   Irrigation volume:  1L   Irrigation method:  Syringe Skin repair:    Repair method:  Sutures   Suture size:  4-0   Suture material:  Nylon   Suture technique:  Simple interrupted   Number of sutures:  12 Approximation:    Approximation:  Close Post-procedure details:    Dressing:  Non-adherent dressing and tube gauze   Patient tolerance of procedure:  Tolerated well, no immediate complications      Medications  lidocaine (PF) (XYLOCAINE) 1 % injection 15 mL (not administered)  oxyCODONE-acetaminophen (PERCOCET/ROXICET) 5-325 MG per tablet 1 tablet (1 tablet Oral Given 05/01/17 1713)  ondansetron (ZOFRAN-ODT) disintegrating tablet 8 mg (8 mg Oral Given  05/01/17 1713)  Tdap (BOOSTRIX) injection 0.5 mL (0.5 mLs Intramuscular Given 05/01/17 1713)     ____________________________________________   INITIAL IMPRESSION / ASSESSMENT AND PLAN / ED COURSE  Pertinent labs & imaging results that were available during my care of the patient were reviewed by me and considered in my medical decision making (see chart for details).  Review of the [place] CSRS was performed in accordance of the [facility] prior to dispensing any controlled drugs.     Patient's diagnosis is consistent with foot contusion with laceration to the right foot. X-ray reveals no osseous abnormality. Laceration is closed as described above. Patient has a mild toenail laceraton to great toe right foot. No complete toenail avulsion. At this time, it is felt the patient would suffer more soft tissue damage from an attempt to remove toenail than leaving in place. Laceration over the MTP joint is closed as described. Patient will be discharged on antibiotics, a few norco, and anti-inflammatories.. Patient will follow up with primary care in 1 week for suture removal. Patient is given ED precautions to return to the ED for any worsening or new symptoms.     ____________________________________________  FINAL CLINICAL IMPRESSION(S) / ED DIAGNOSES  Final diagnoses:  Contusion of right foot, initial encounter  Laceration of right great toe without foreign body present or damage to nail, initial encounter  Injury of toenail of right foot, initial encounter      NEW MEDICATIONS STARTED DURING THIS VISIT:  New Prescriptions   CEPHALEXIN (KEFLEX) 500 MG CAPSULE    Take 1 capsule (500 mg total) by mouth 4 (four) times daily.   HYDROCODONE-ACETAMINOPHEN (NORCO/VICODIN) 5-325 MG TABLET    Take 1 tablet by mouth every 4 (four) hours as needed for moderate pain.   MELOXICAM (MOBIC) 15 MG TABLET    Take 1 tablet (15 mg total) by mouth daily.        This chart was dictated using voice  recognition software/Dragon. Despite best efforts to proofread, errors can occur which can change the meaning. Any change was purely unintentional.    Racheal Patches, PA-C 05/01/17 1829    Merrily Brittle, MD 05/01/17 [phone]

## 2017-10-31 ENCOUNTER — Emergency Department: Payer: BLUE CROSS/BLUE SHIELD

## 2017-10-31 ENCOUNTER — Encounter: Payer: Self-pay | Admitting: Emergency Medicine

## 2017-10-31 ENCOUNTER — Other Ambulatory Visit: Payer: Self-pay

## 2017-10-31 ENCOUNTER — Emergency Department
Admission: EM | Admit: 2017-10-31 | Discharge: 2017-10-31 | Disposition: A | Discharge status: Home / Self Care | Payer: BLUE CROSS/BLUE SHIELD | Attending: Emergency Medicine | Admitting: Emergency Medicine

## 2017-10-31 DIAGNOSIS — S62352A Nondisplaced fracture of shaft of third metacarpal bone, right hand, initial encounter for closed fracture: Secondary | ICD-10-CM | POA: Diagnosis not present

## 2017-10-31 DIAGNOSIS — F419 Anxiety disorder, unspecified: Secondary | ICD-10-CM | POA: Insufficient documentation

## 2017-10-31 DIAGNOSIS — Y929 Unspecified place or not applicable: Secondary | ICD-10-CM | POA: Diagnosis not present

## 2017-10-31 DIAGNOSIS — F329 Major depressive disorder, single episode, unspecified: Secondary | ICD-10-CM | POA: Insufficient documentation

## 2017-10-31 DIAGNOSIS — W500XXA Accidental hit or strike by another person, initial encounter: Secondary | ICD-10-CM | POA: Insufficient documentation

## 2017-10-31 DIAGNOSIS — Z85828 Personal history of other malignant neoplasm of skin: Secondary | ICD-10-CM | POA: Diagnosis not present

## 2017-10-31 DIAGNOSIS — Y9389 Activity, other specified: Secondary | ICD-10-CM | POA: Insufficient documentation

## 2017-10-31 DIAGNOSIS — S62353A Nondisplaced fracture of shaft of third metacarpal bone, left hand, initial encounter for closed fracture: Secondary | ICD-10-CM

## 2017-10-31 DIAGNOSIS — Z79899 Other long term (current) drug therapy: Secondary | ICD-10-CM | POA: Diagnosis not present

## 2017-10-31 DIAGNOSIS — Y998 Other external cause status: Secondary | ICD-10-CM | POA: Diagnosis not present

## 2017-10-31 DIAGNOSIS — F17210 Nicotine dependence, cigarettes, uncomplicated: Secondary | ICD-10-CM | POA: Diagnosis not present

## 2017-10-31 DIAGNOSIS — S6991XA Unspecified injury of right wrist, hand and finger(s), initial encounter: Secondary | ICD-10-CM | POA: Diagnosis present

## 2017-10-31 MED ORDER — TRAMADOL HCL 50 MG PO TABS: 50.0000 mg | ORAL_TABLET | Freq: Two times a day (BID) | ORAL | 0 refills | Status: DC | PRN

## 2017-10-31 MED ORDER — IBUPROFEN 800 MG PO TABS: 800.0000 mg | ORAL_TABLET | Freq: Three times a day (TID) | ORAL | 0 refills | Status: DC | PRN

## 2017-10-31 MED ORDER — OXYCODONE-ACETAMINOPHEN 5-325 MG PO TABS: 1.0000 | ORAL_TABLET | Freq: Four times a day (QID) | ORAL | 0 refills | Status: DC | PRN

## 2017-10-31 MED ORDER — IBUPROFEN 800 MG PO TABS
800.0000 mg | ORAL_TABLET | Freq: Once | ORAL | Status: AC
  Administered 2017-10-31: 800 mg via ORAL
  Filled 2017-10-31: qty 1
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
## 2017-10-31 NOTE — ED Notes (Signed)
Says he cannot take the tramadol with the fluexitine he is on.
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
## 2017-10-31 NOTE — Discharge Instructions (Signed)
Hand splint until evaluation by orthopedics.

## 2018-04-17 IMAGING — DX DG FOOT COMPLETE 3+V*R*
3 series · 3 of 3 positions shown · non-contrast
Comparison: None.

CLINICAL DATA: Patient reports he was attempting to move [employer] today when it landed on his right foot. Reports stove fell
primarily on big toe. Patient has laceration to dorsal surface of
big toe. Hx removal of ingrown toe nail to big toe approx. 2 years
ago, remote hx of right ankle fx.

EXAM:
RIGHT FOOT COMPLETE - 3+ VIEW

[foot ap]
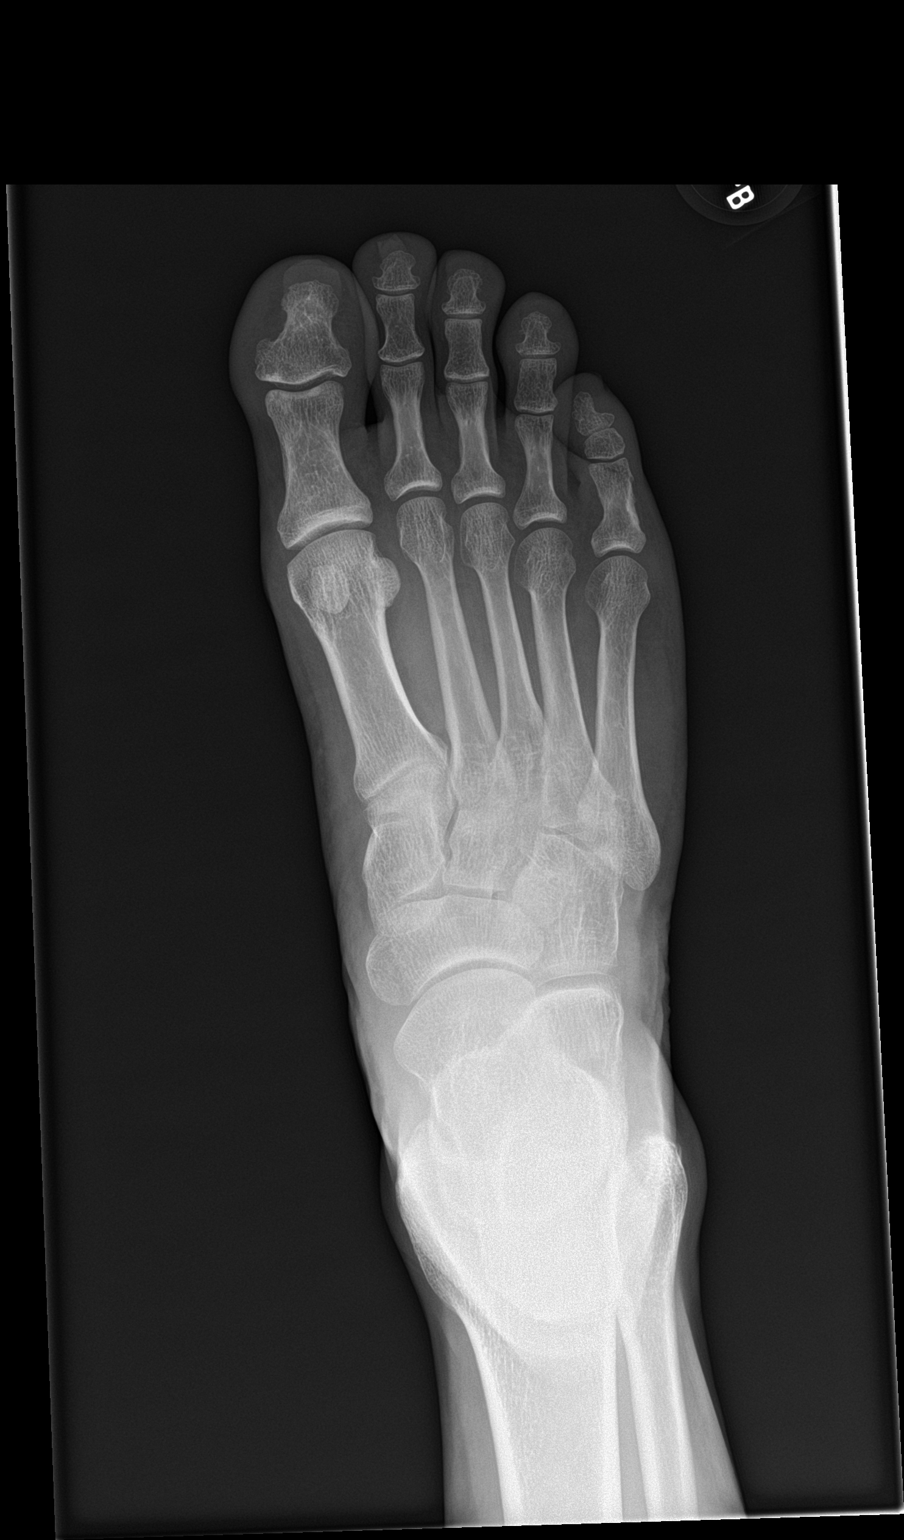

[foot obl]
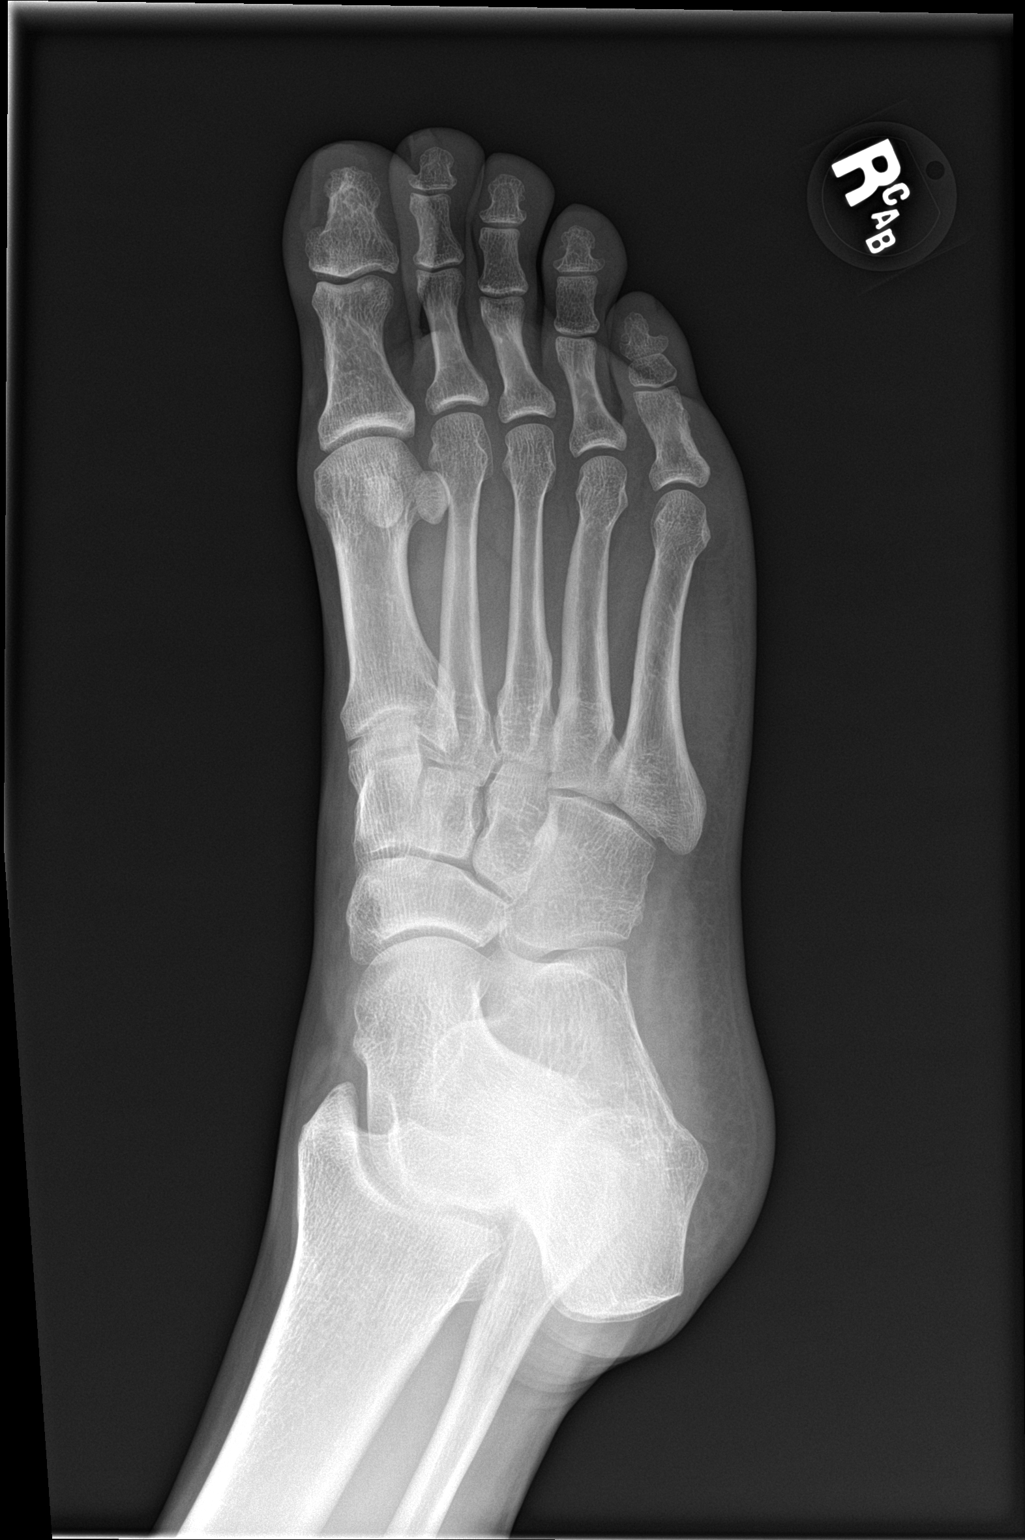

[foot lat]
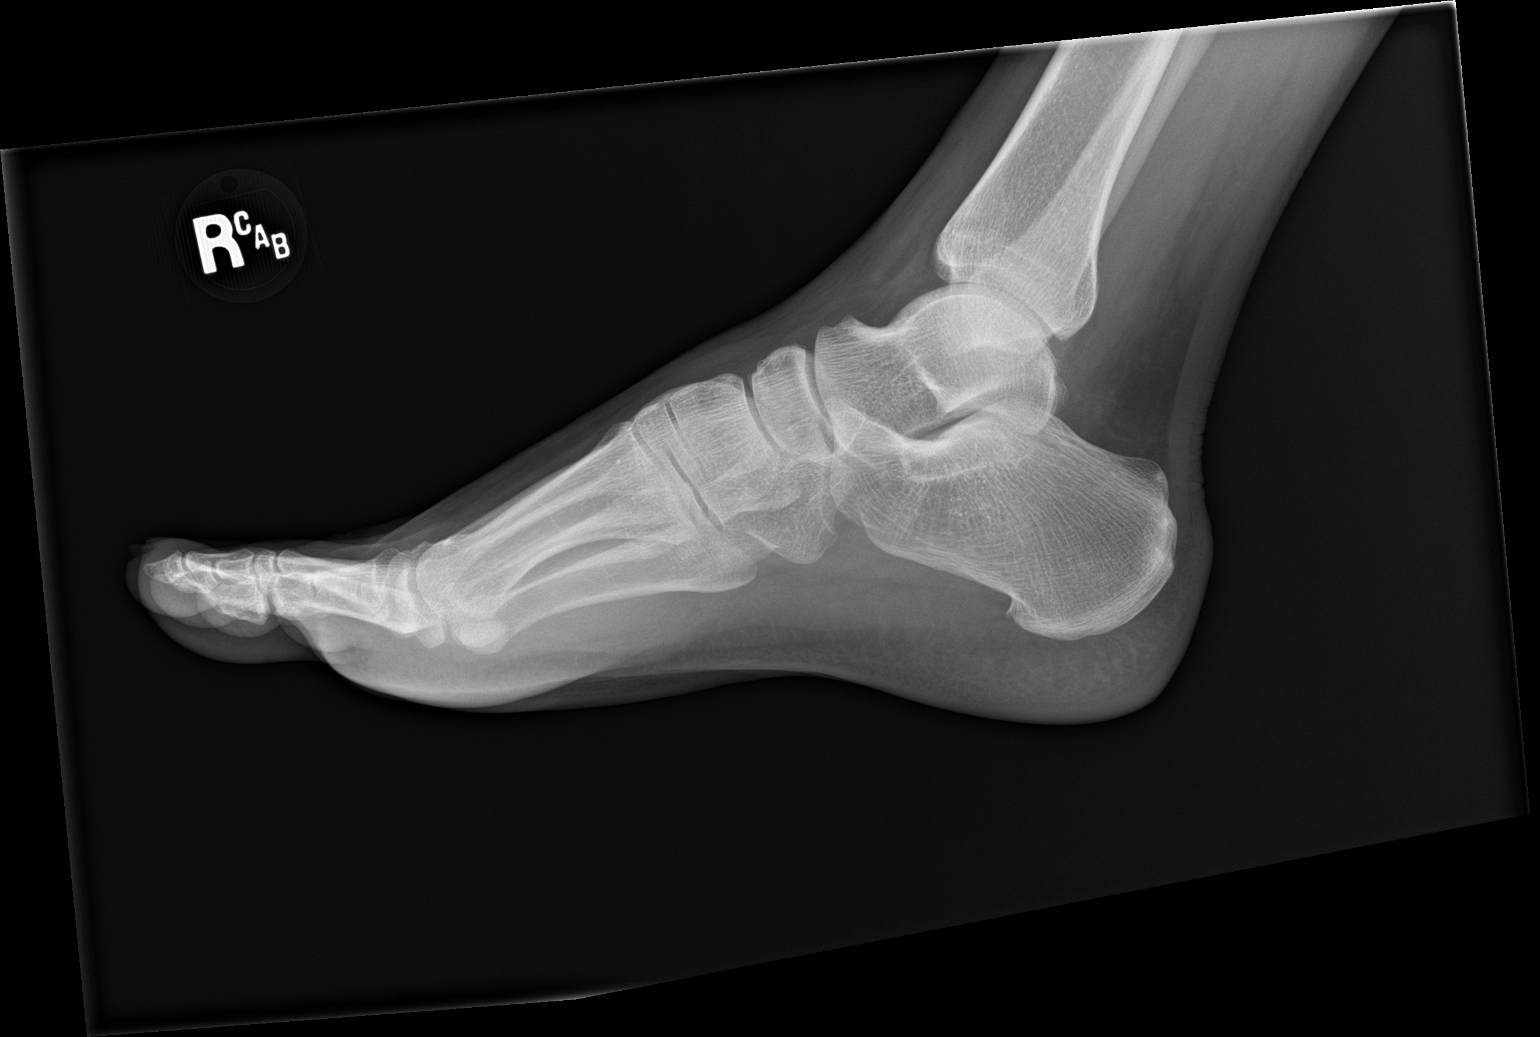

[3 of 3 positions shown; findings below may reference images not displayed]

FINDINGS: There is no evidence of fracture or dislocation. There is no
evidence of arthropathy or other focal bone abnormality. Soft
tissues are unremarkable.
IMPRESSION: Negative.

## 2018-10-10 DIAGNOSIS — B009 Herpesviral infection, unspecified: Secondary | ICD-10-CM | POA: Insufficient documentation

## 2018-10-10 LAB — HM HIV SCREENING LAB: HM HIV Screening: NEGATIVE

## 2018-10-17 IMAGING — DX DG HAND COMPLETE 3+V*R*
3 series · 3 of 3 positions shown · non-contrast
Comparison: June 01, 2015

CLINICAL DATA: Pain after hitting wall

EXAM:
RIGHT HAND - COMPLETE 3+ VIEW

[hand ap]
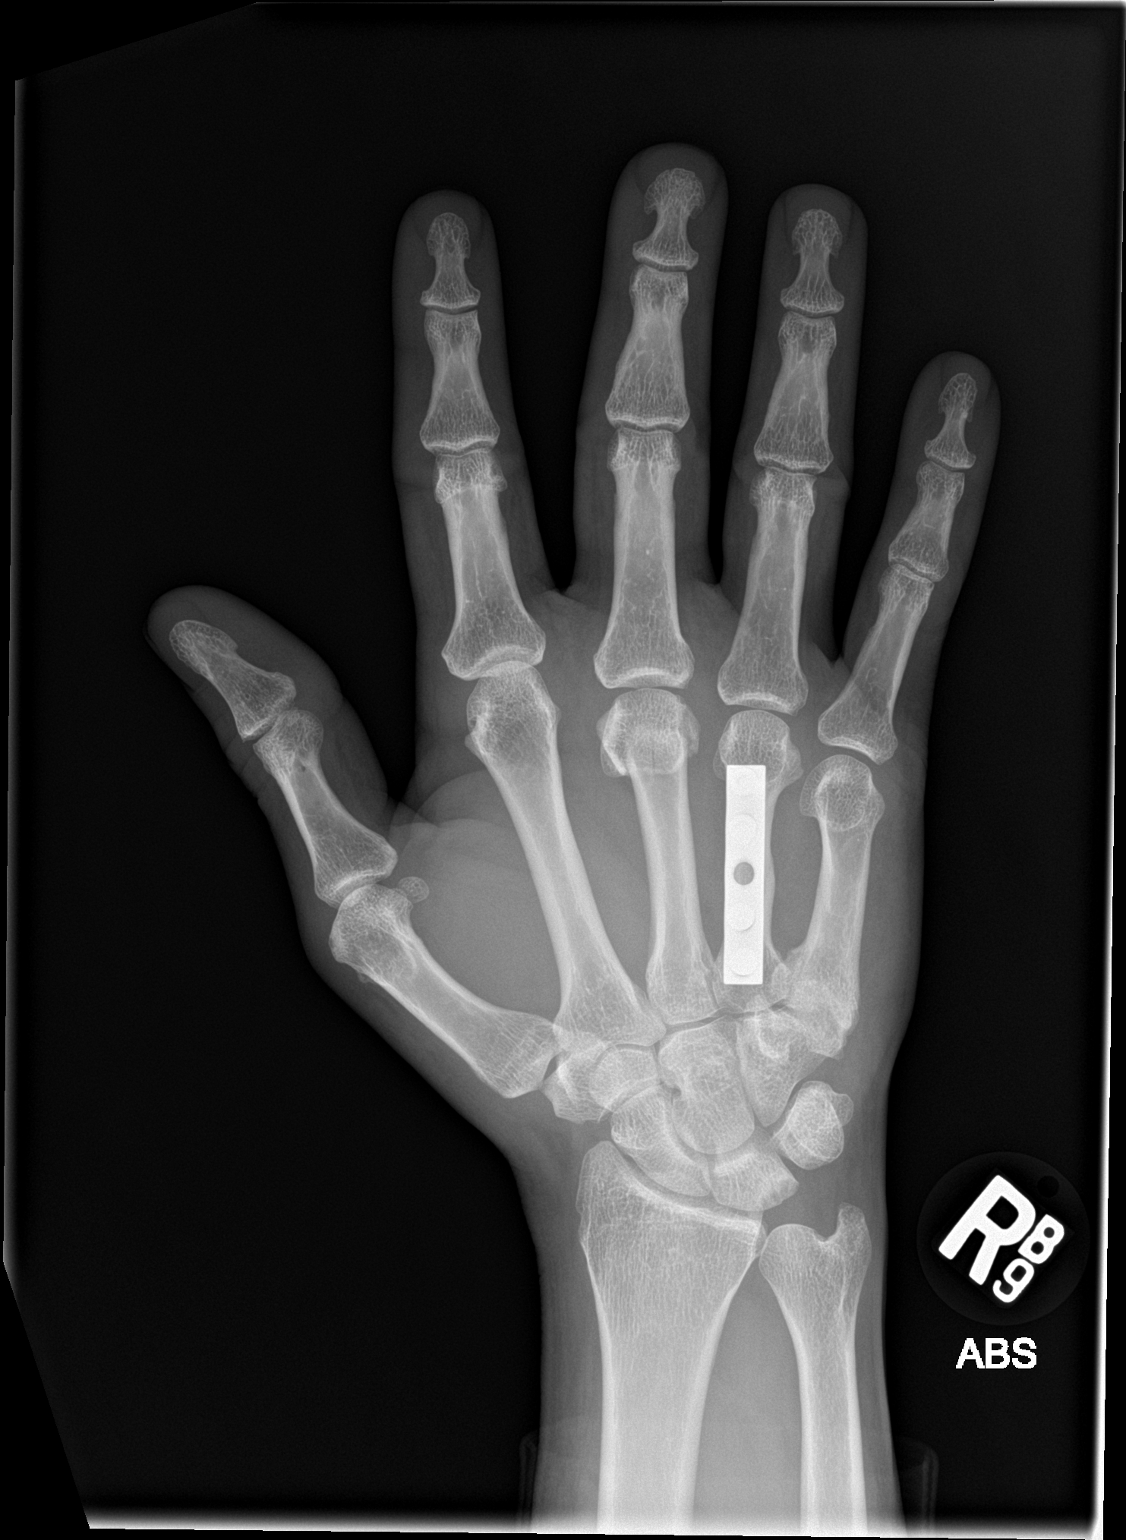

[hand obl]
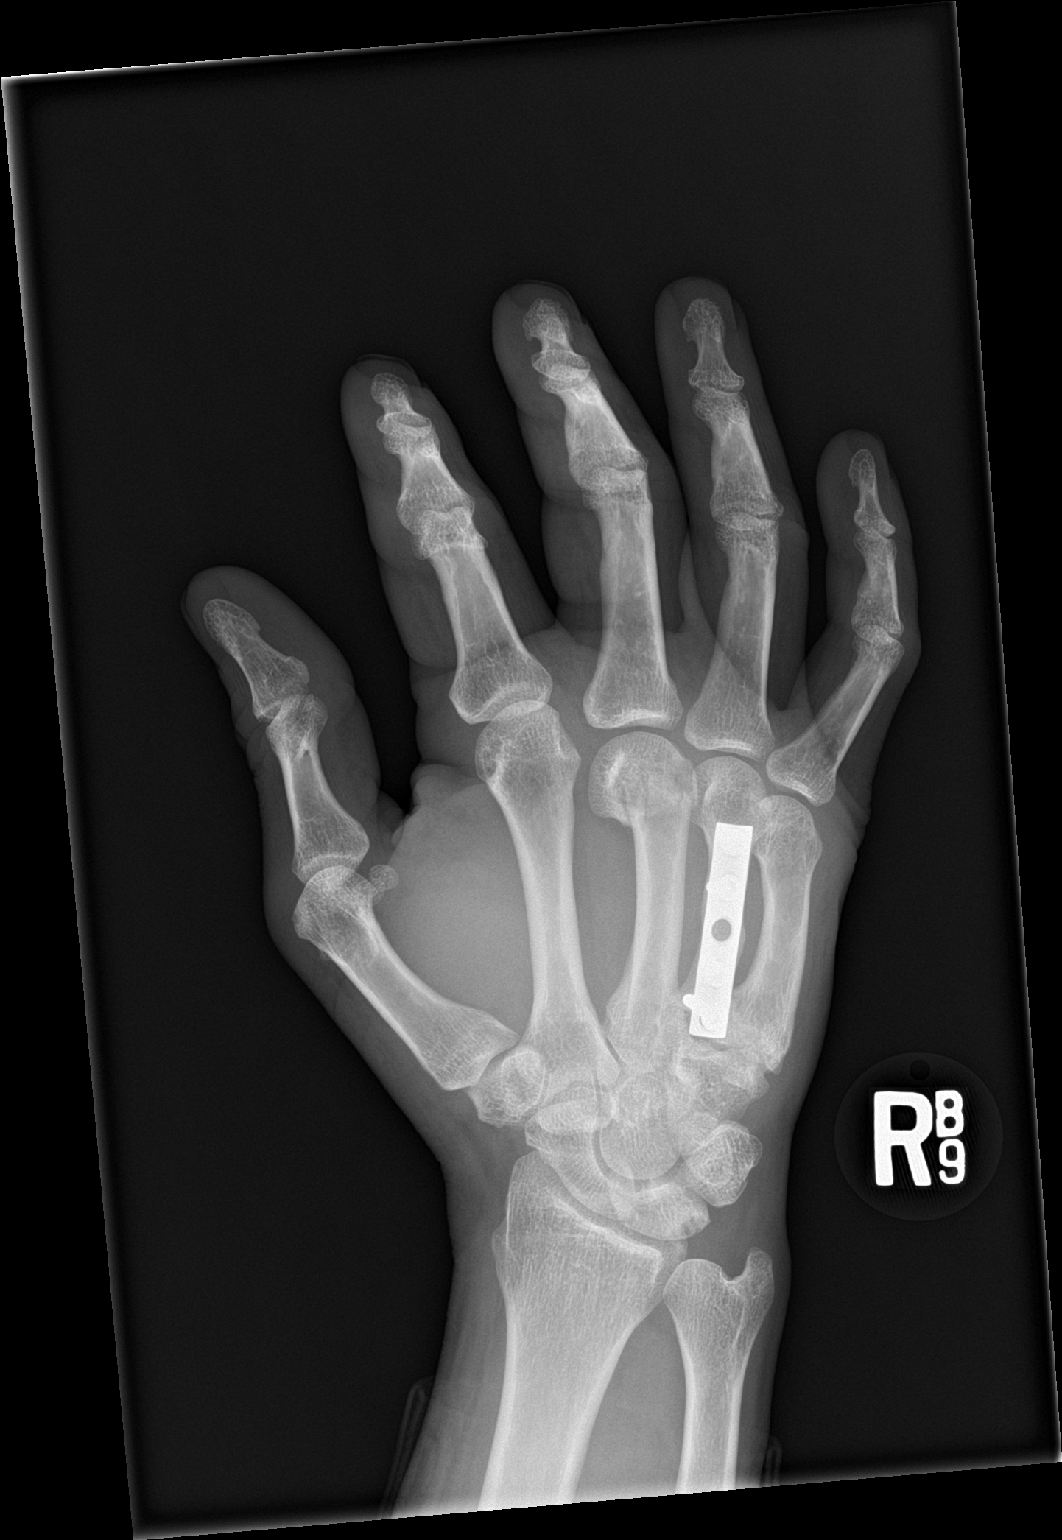

[hand lat]
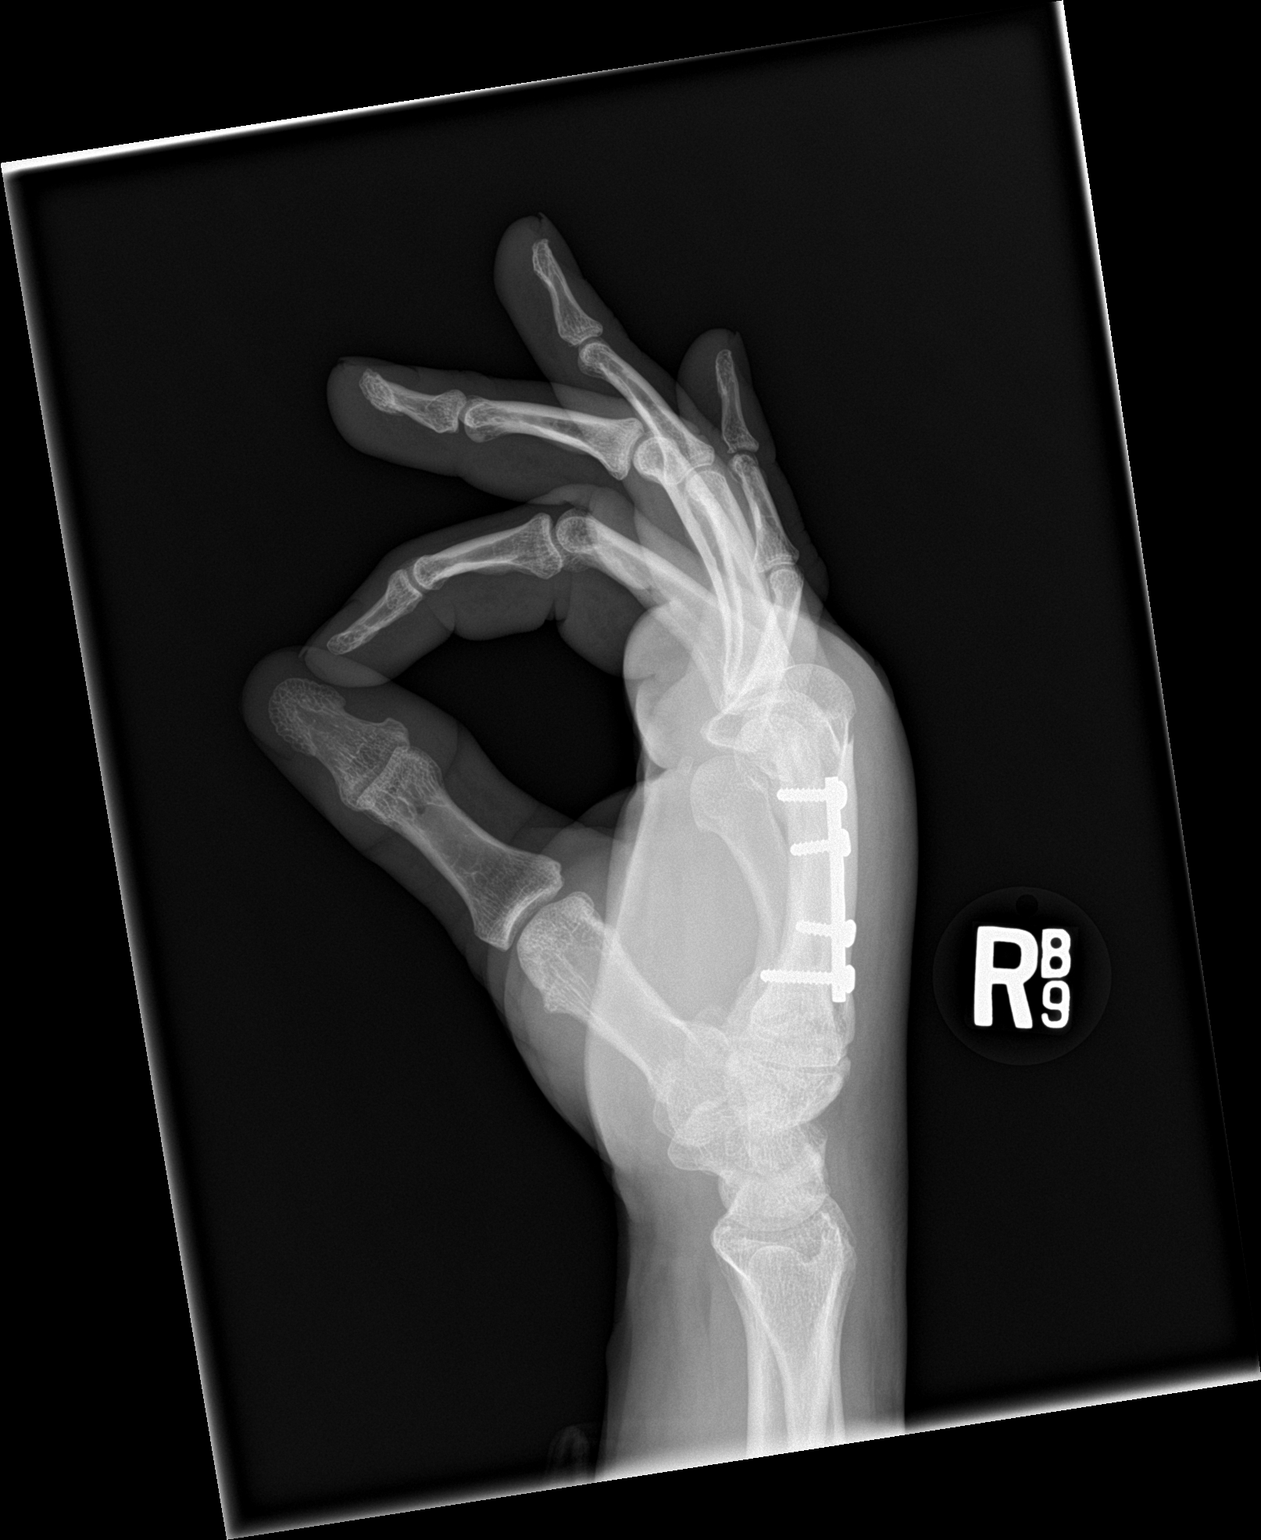

[3 of 3 positions shown; findings below may reference images not displayed]

FINDINGS: Frontal, oblique, and lateral views were obtained. There is a
comminuted fracture of the distal third metacarpal with volar
angulation distally. There is postoperative change in the fourth
metacarpal with alignment essentially anatomic. There is an old
healed fracture of the proximal fifth metacarpal in anatomic
alignment. No other fracture evident. No dislocation. Joint spaces
appear unremarkable. No erosive change.
IMPRESSION: Comminuted acute fracture distal third metacarpal with volar
angulation distally. Old healed fracture proximal fifth metacarpal.
Postoperative change fourth metacarpal with alignment anatomic. No
other fracture. No dislocation. No appreciable arthropathy.

## 2019-03-07 DIAGNOSIS — B009 Herpesviral infection, unspecified: Secondary | ICD-10-CM

## 2019-03-09 ENCOUNTER — Ambulatory Visit: Payer: Self-pay

## 2020-05-07 ENCOUNTER — Ambulatory Visit: Payer: Self-pay | Admitting: Family Medicine

## 2020-05-07 ENCOUNTER — Encounter: Payer: Self-pay | Admitting: Family Medicine

## 2020-05-07 ENCOUNTER — Other Ambulatory Visit: Payer: Self-pay

## 2020-05-07 DIAGNOSIS — N481 Balanitis: Secondary | ICD-10-CM

## 2020-05-07 DIAGNOSIS — Z113 Encounter for screening for infections with a predominantly sexual mode of transmission: Secondary | ICD-10-CM

## 2020-05-07 DIAGNOSIS — Z792 Long term (current) use of antibiotics: Secondary | ICD-10-CM

## 2020-05-07 LAB — GRAM STAIN

## 2020-05-07 MED ORDER — DOXYCYCLINE HYCLATE 100 MG PO TABS: 100.0000 mg | ORAL_TABLET | Freq: Two times a day (BID) | ORAL | 0 refills | Status: AC

## 2020-05-07 MED ORDER — CLOTRIMAZOLE 1 % EX CREA: TOPICAL_CREAM | Freq: Two times a day (BID) | CUTANEOUS | Status: DC

## 2020-05-07 NOTE — Progress Notes (Signed)
[name] clinic/screening visit  Subjective:  Joshua Leblanc. is a 44 y.o. male being seen today for an STI screening visit. The patient reports they do have symptoms.    Patient has the following medical conditions:   Patient Active Problem List   Diagnosis Date Noted  . Herpes simplex virus infection 10/10/2018  . Anxiety, generalized 09/25/2015     Chief Complaint  Patient presents with  . SEXUALLY TRANSMITTED DISEASE    HPI  Patient reports that he is here for an STD screen.  States his symptoms began about 2-3 days ago.  States that he and his partner had unprotected sex 04/26/2020 d/t he had no condoms.  He also found that his partner was sexually active with someone else.  He is unsure of his what huis exposure is to STDs.  His complaints are:  An area 3/4 around his glans has redness and itching-denies discharge or pain;  Has occaissional bilateral testicular pain,denies swelling, redness; his tongue has felt drier and there is some pain-denies lesions/masses or swelling; and he has R mid-abdominal pain, intermittent- he has been constipated.  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  States he had been unable to communicate with his partner since he found her with another partner.   See flowsheet for further details and programmatic requirements.    The following portions of the patient's history were reviewed and updated as appropriate: allergies, current medications, past medical history, past social history, past surgical history and problem list.  Objective:  There were no vitals filed for this visit.  Physical Exam Constitutional:      Appearance: Normal appearance.  HENT:     Head: Normocephalic and atraumatic.     Comments: No nits or hair loss    Mouth/Throat:     Mouth: Mucous membranes are moist.     Pharynx: Oropharynx is clear. No oropharyngeal  exudate or posterior oropharyngeal erythema.  Pulmonary:     Effort: Pulmonary effort is normal.  Abdominal:     General: Abdomen is flat.     Palpations: Abdomen is soft. There is no hepatomegaly or mass.     Tenderness: There is no abdominal tenderness.  Genitourinary:    Pubic Area: No rash or pubic lice.      Penis: Erythema present.      Testes: Normal.     Epididymis:     Right: Normal.     Left: Normal.     Comments: Penis- erythematous area around the corona with a small amount of thick white discharge, no lesions or swelling noted.  Circumcised male Testicles- no masses, tenderness on exam, no swelling/erythema Lymphadenopathy:     Head:     Right side of head: No preauricular or posterior auricular adenopathy.     Left side of head: No preauricular or posterior auricular adenopathy.     Cervical: No cervical adenopathy.     Upper Body:     Right upper body: No supraclavicular or axillary adenopathy.     Left upper body: No supraclavicular or axillary adenopathy.     Lower Body: No right inguinal adenopathy. No left inguinal adenopathy.  Skin:    General: Skin is warm and dry.     Findings: No rash.  Neurological:     Mental Status: He is alert and oriented to person, place, and time.  Assessment and Plan:  Joshua Leblanc. is a 44 y.o. male presenting to the [facility] Department for STI screening  1. Screening examination for venereal disease  - Gram stain - Gonococcus culture - HIV [place] [facility] - Syphilis Serology, [place] [facility] - Gonococcus culture  2. Balanitis  - clotrimazole (LOTRIMIN) 1 % cream Apply cream to affected areas 2 times/day.  Clean area with warm clothe prior to applying cream.  3. Prophylactic antibiotic Offered client treatment for unknown chlamydia infection. Client agrees to treatment. Doxycycline 100 mg capsule take 1 po BID x 14 days. Co to abstain from sex during his treatment.  Contact partner about his  treatment. Pending labs.  Client will be contact if test  Results are +.  Client has an appointment with in PCP in 2 weeks.  If symptoms have not resolved discuss to be re-evaluated by his PCP.  No follow-ups on file.  No future appointments.  Larene Pickett, FNP

## 2020-05-07 NOTE — Progress Notes (Signed)
Gram stain reviewed by provider, pt treated with Doxycycline 100 mg BID x 14 days per provider orders. Also dispensed topical Clotrimazole per provider orders. Provider orders completed.

## 2020-05-12 LAB — GONOCOCCUS CULTURE

## 2020-08-21 ENCOUNTER — Other Ambulatory Visit: Payer: BC Managed Care – PPO

## 2020-08-21 DIAGNOSIS — Z20822 Contact with and (suspected) exposure to covid-19: Secondary | ICD-10-CM

## 2020-08-23 LAB — NOVEL CORONAVIRUS, NAA: SARS-CoV-2, NAA: NOT DETECTED

## 2020-08-23 LAB — SARS-COV-2, NAA 2 DAY TAT

## 2023-01-06 ENCOUNTER — Ambulatory Visit: Payer: Self-pay | Admitting: Family Medicine

## 2023-01-06 ENCOUNTER — Encounter: Payer: Self-pay | Admitting: Family Medicine

## 2023-01-06 DIAGNOSIS — Z113 Encounter for screening for infections with a predominantly sexual mode of transmission: Secondary | ICD-10-CM

## 2023-01-06 LAB — GRAM STAIN

## 2023-01-06 LAB — HM HEPATITIS C SCREENING LAB: HM Hepatitis Screen: NEGATIVE

## 2023-01-06 LAB — HEPATITIS B SURFACE ANTIGEN: Hepatitis B Surface Ag: NONREACTIVE

## 2023-01-06 LAB — HM HIV SCREENING LAB: HM HIV Screening: NEGATIVE

## 2023-01-06 NOTE — Progress Notes (Signed)
Pt appointment for STI screening. Seen by FNP Sydnee Levans. Gram stain result negative. All questions answered.

## 2023-01-06 NOTE — Progress Notes (Signed)
[facility] Department STI clinic/screening visit  Subjective:  Joshua Leblanc. is a 47 y.o. male being seen today for an STI screening visit. The patient reports they do have symptoms.    Patient has the following medical conditions:   Patient Active Problem List   Diagnosis Date Noted   Herpes simplex virus infection 10/10/2018   Anxiety, generalized 09/25/2015     Chief Complaint  Patient presents with   SEXUALLY TRANSMITTED DISEASE    Irritation at tip of penis, and "my mouth just feels funny"    HPI  Patient reports to clinic with a 3 week hx of irritation at the tip of his penis. Denies itching or discharge, lesions, or rash. Denies being a contact to STD. Chart review shows previous positive Hep C- discovered after initial interview with patient. Retesting for Hep C today.   Last HIV test per patient/review of record was  Lab Results  Component Value Date   HMHIVSCREEN Negative - Validated 10/10/2018   No results found for: "HIV"  Does the patient or their partner desires a pregnancy in the next year? No  Screening for MPX risk: Does the patient have an unexplained rash? No Is the patient MSM? No Does the patient endorse multiple sex partners or anonymous sex partners? No Did the patient have close or sexual contact with a person diagnosed with MPX? No Has the patient traveled outside the [place] where MPX is endemic? No Is there a high clinical suspicion for MPX-- evidenced by one of the following No  -Unlikely to be chickenpox  -Lymphadenopathy  -Rash that present in same phase of evolution on any given body part   See flowsheet for further details and programmatic requirements.   Immunization History  Administered Date(s) Administered   Tdap 05/01/2017     The following portions of the patient's history were reviewed and updated as appropriate: allergies, current medications, past medical history, past social history, past surgical history  and problem list.  Objective:  There were no vitals filed for this visit.  Physical Exam Constitutional:      Appearance: Normal appearance.  HENT:     Head: Normocephalic and atraumatic.     Comments: No nits or hair loss    Mouth/Throat:     Mouth: Mucous membranes are moist. No oral lesions.     Pharynx: Oropharynx is clear. No oropharyngeal exudate or posterior oropharyngeal erythema.  Eyes:     General:        Right eye: No discharge.        Left eye: No discharge.     Conjunctiva/sclera:     Right eye: Right conjunctiva is not injected. No exudate.    Left eye: Left conjunctiva is not injected. No exudate. Pulmonary:     Effort: Pulmonary effort is normal.  Abdominal:     General: Abdomen is flat.     Palpations: Abdomen is soft. There is no hepatomegaly or mass.     Tenderness: There is no abdominal tenderness. There is no rebound.     Hernia: There is no hernia in the left inguinal area or right inguinal area.  Genitourinary:    Pubic Area: No rash or pubic lice (no nits).      Penis: Normal and circumcised. No tenderness, discharge, swelling or lesions.      Testes: Normal.     Epididymis:     Right: Normal. No mass or tenderness.     Left: Normal. No mass  or tenderness.     Rectum: Normal. No tenderness (no lesions or discharge).     Comments: Penile Discharge Amount: none Color:  none Lymphadenopathy:     Head:     Right side of head: No preauricular or posterior auricular adenopathy.     Left side of head: No preauricular or posterior auricular adenopathy.     Cervical: No cervical adenopathy.     Upper Body:     Right upper body: No supraclavicular, axillary or epitrochlear adenopathy.     Left upper body: No supraclavicular, axillary or epitrochlear adenopathy.     Lower Body: No right inguinal adenopathy. No left inguinal adenopathy.  Skin:    General: Skin is warm and dry.     Findings: No lesion or rash.  Neurological:     Mental Status: He is  alert and oriented to person, place, and time.       Assessment and Plan:  Joshua Leblanc. is a 47 y.o. male presenting to the [facility] Department for STI screening  1. Screening for venereal disease  - HIV/HCV [place] [facility] - HBV Antigen/Antibody [facility] - Syphilis Serology, [place] [facility] - Chlamydia/GC NAA, Confirmation - Gram stain   Patient does have STI symptoms Patient accepted all screenings including  urine GC/Chlamydia, and blood work for HIV/Syphilis. Patient meets criteria for HepB screening? Yes. Ordered? yes Patient meets criteria for HepC screening? Yes. Ordered? yes Recommended condom use with all sex Discussed importance of condom use for STI prevent  Treat positive test results per standing order. Discussed time line for State [facility] results and that patient will be called with positive results and encouraged patient to call if he had not heard in 2 weeks Recommended repeat testing in 3 months with positive results. Recommended returning for continued or worsening symptoms.   Return if symptoms worsen or fail to improve, for STI screening.  No future appointments. Total time spent 20 minutes Lenice Llamas, [place]

## 2023-01-10 LAB — CHLAMYDIA/GC NAA, CONFIRMATION
Chlamydia trachomatis, NAA: NEGATIVE
Neisseria gonorrhoeae, NAA: NEGATIVE

## 2023-05-13 ENCOUNTER — Ambulatory Visit (INDEPENDENT_AMBULATORY_CARE_PROVIDER_SITE_OTHER): Payer: BC Managed Care – PPO

## 2023-05-13 ENCOUNTER — Ambulatory Visit
Admission: EM | Admit: 2023-05-13 | Discharge: 2023-05-13 | Disposition: A | Discharge status: Home / Self Care | Payer: BC Managed Care – PPO | Attending: Emergency Medicine | Admitting: Emergency Medicine

## 2023-05-13 DIAGNOSIS — J209 Acute bronchitis, unspecified: Secondary | ICD-10-CM | POA: Diagnosis not present

## 2023-05-13 DIAGNOSIS — R058 Other specified cough: Secondary | ICD-10-CM | POA: Diagnosis not present

## 2023-05-13 DIAGNOSIS — F17200 Nicotine dependence, unspecified, uncomplicated: Secondary | ICD-10-CM | POA: Diagnosis not present

## 2023-05-13 MED ORDER — PREDNISONE 10 MG (21) PO TBPK: ORAL_TABLET | Freq: Every day | ORAL | 0 refills | Status: DC

## 2023-05-13 NOTE — Discharge Instructions (Addendum)
Take the prednisone as directed.  Follow up with your primary care provider.

## 2023-05-13 NOTE — ED Provider Notes (Signed)
Renaldo Fiddler    CSN: [id number] Arrival date & time: 05/13/23  6578      History   Chief Complaint Chief Complaint  Patient presents with   Cough   Nasal Congestion    HPI Joshua Leblanc. is a 47 y.o. male.  Patient presents with 4 day history of fever, congestion, productive cough.  Treating with OTC cold medication.  Cough is productive of black sputum in the morning and yellow-green sputum the rest of the day.  No fever in the last 24 hours.  No OTC medications taken today.  No shortness of breath, chest pain, or other symptoms.  Current everyday smoker.  He denies history of heart or lung disease.  The history is provided by the patient and medical records.    Past Medical History:  Diagnosis Date   Anxiety    Depression    Headache    Neck pain     Patient Active Problem List   Diagnosis Date Noted   Herpes simplex virus infection 10/10/2018   Anxiety, generalized 09/25/2015    Past Surgical History:  Procedure Laterality Date   MELANOMA EXCISION     ORIF CARPAL BONE FRACTURE     TONSILLECTOMY AND ADENOIDECTOMY         Home Medications    Prior to Admission medications   Medication Sig Start Date End Date Taking? Authorizing Provider  predniSONE (STERAPRED UNI-PAK 21 TAB) 10 MG (21) TBPK tablet Take by mouth daily. As directed 05/13/23  Yes Mickie Bail, NP  ibuprofen (ADVIL,MOTRIN) 800 MG tablet Take 1 tablet (800 mg total) by mouth every 8 (eight) hours as needed for mild pain or moderate pain. 01/28/16   Renford Dills, NP    Family History Family History  Problem Relation Age of Onset   Hypertension Mother    Cancer Father        melanoma    Social History Social History   Tobacco Use   Smoking status: Every Day    Current packs/day: 1.00    Average packs/day: 1 pack/day for 15.0 years (15.0 ttl pk-yrs)    Types: Cigarettes   Smokeless tobacco: Never  Substance Use Topics   Alcohol use: Yes    Alcohol/week: 0.0  standard drinks of alcohol    Comment: 1-2 /week   Drug use: Not Currently     Allergies   Alprazolam and Codeine   Review of Systems Review of Systems  Constitutional:  Positive for fever. Negative for chills.  HENT:  Positive for congestion, rhinorrhea and sneezing. Negative for ear pain and sore throat.   Respiratory:  Positive for cough. Negative for shortness of breath.   Cardiovascular:  Negative for chest pain and palpitations.     Physical Exam Triage Vital Signs ED Triage Vitals  Encounter Vitals Group     BP      Systolic BP Percentile      Diastolic BP Percentile      Pulse      Resp      Temp      Temp src      SpO2      Weight      Height      Head Circumference      Peak Flow      Pain Score      Pain Loc      Pain Education      Exclude from Growth Chart    No data found.  Updated Vital Signs BP 126/84   Pulse 75   Temp 98.3 F (36.8 C) (Oral)   Resp 18   SpO2 98%   Visual Acuity Right Eye Distance:   Left Eye Distance:   Bilateral Distance:    Right Eye Near:   Left Eye Near:    Bilateral Near:     Physical Exam Vitals and nursing note reviewed.  Constitutional:      General: He is not in acute distress.    Appearance: He is well-developed.  HENT:     Right Ear: Tympanic membrane normal.     Left Ear: Tympanic membrane normal.     Nose: Nose normal.     Mouth/Throat:     Mouth: Mucous membranes are moist.     Pharynx: Oropharynx is clear.  Cardiovascular:     Rate and Rhythm: Normal rate and regular rhythm.     Heart sounds: Normal heart sounds.  Pulmonary:     Effort: Pulmonary effort is normal. No respiratory distress.     Breath sounds: Normal breath sounds.  Musculoskeletal:     Cervical back: Neck supple.  Skin:    General: Skin is warm and dry.  Neurological:     Mental Status: He is alert.  Psychiatric:        Mood and Affect: Mood normal.        Behavior: Behavior normal.      UC Treatments / Results   Labs (all labs ordered are listed, but only abnormal results are displayed) Labs Reviewed - No data to display  EKG   Radiology DG Chest 2 View  Result Date: 05/13/2023 CLINICAL DATA:  Productive cough and fever. EXAM: CHEST - 2 VIEW COMPARISON:  10/01/2010 FINDINGS: The heart size and mediastinal contours are within normal limits. Both lungs are clear. The visualized skeletal structures are unremarkable. IMPRESSION: No active cardiopulmonary disease. Electronically Signed   By: Danae Orleans M.D.   On: 05/13/2023 10:18    Procedures Procedures (including critical care time)  Medications Ordered in UC Medications - No data to display  Initial Impression / Assessment and Plan / UC Course  I have reviewed the triage vital signs and the nursing notes.  Pertinent labs & imaging results that were available during my care of the patient were reviewed by me and considered in my medical decision making (see chart for details).    Productive cough, acute bronchitis, smoker.  Chest x-ray negative.  O2 sat 98% on room air.  Treating with prednisone taper.  Smoking cessation discussed.  Education provided on acute bronchitis and health risk of smoking.  Patient is receptive to attempting smoking cessation.  Instructed patient to follow-up with his PCP next week.  He agrees to plan of care.  Final Clinical Impressions(s) / UC Diagnoses   Final diagnoses:  Productive cough  Acute bronchitis, unspecified organism  Smoker     Discharge Instructions      Take the prednisone as directed.    Follow-up with your primary care provider.      ED Prescriptions     Medication Sig Dispense Auth. Provider   predniSONE (STERAPRED UNI-PAK 21 TAB) 10 MG (21) TBPK tablet Take by mouth daily. As directed 21 tablet Mickie Bail, NP      PDMP not reviewed this encounter.   Mickie Bail, NP 05/13/23 1027

## 2023-05-13 NOTE — ED Triage Notes (Signed)
Patient to Urgent Care with complaints of  fevers/ cough/ nasal congestion. Productive cough with dark sputum.  Fevers Tuesday/ Wednesday- max 102.  Taking Nyquil/ Oscillococcinum.

## 2023-05-21 ENCOUNTER — Inpatient Hospital Stay
Admission: EM | Admit: 2023-05-21 | Discharge: 2023-05-23 | DRG: 322 | Disposition: A | Discharge status: Home / Self Care | Payer: BC Managed Care – PPO | Attending: Internal Medicine | Admitting: Internal Medicine

## 2023-05-21 ENCOUNTER — Encounter
Admission: EM | Disposition: A | Discharge status: Home / Self Care | Payer: Self-pay | Source: Home / Self Care | Attending: Internal Medicine

## 2023-05-21 ENCOUNTER — Other Ambulatory Visit: Payer: Self-pay

## 2023-05-21 ENCOUNTER — Emergency Department: Payer: BC Managed Care – PPO

## 2023-05-21 ENCOUNTER — Inpatient Hospital Stay
Admit: 2023-05-21 | Discharge: 2023-05-21 | Disposition: A | Discharge status: Home / Self Care | Payer: BC Managed Care – PPO | Attending: Cardiology | Admitting: Cardiology

## 2023-05-21 DIAGNOSIS — Z8249 Family history of ischemic heart disease and other diseases of the circulatory system: Secondary | ICD-10-CM | POA: Diagnosis not present

## 2023-05-21 DIAGNOSIS — Z7982 Long term (current) use of aspirin: Secondary | ICD-10-CM | POA: Diagnosis not present

## 2023-05-21 DIAGNOSIS — R079 Chest pain, unspecified: Secondary | ICD-10-CM | POA: Diagnosis not present

## 2023-05-21 DIAGNOSIS — Z5971 Insufficient health insurance coverage: Secondary | ICD-10-CM

## 2023-05-21 DIAGNOSIS — E876 Hypokalemia: Secondary | ICD-10-CM | POA: Diagnosis present

## 2023-05-21 DIAGNOSIS — Z885 Allergy status to narcotic agent status: Secondary | ICD-10-CM | POA: Diagnosis not present

## 2023-05-21 DIAGNOSIS — Z716 Tobacco abuse counseling: Secondary | ICD-10-CM

## 2023-05-21 DIAGNOSIS — Z808 Family history of malignant neoplasm of other organs or systems: Secondary | ICD-10-CM | POA: Diagnosis not present

## 2023-05-21 DIAGNOSIS — I255 Ischemic cardiomyopathy: Secondary | ICD-10-CM | POA: Diagnosis present

## 2023-05-21 DIAGNOSIS — Z85820 Personal history of malignant melanoma of skin: Secondary | ICD-10-CM | POA: Diagnosis not present

## 2023-05-21 DIAGNOSIS — Z955 Presence of coronary angioplasty implant and graft: Secondary | ICD-10-CM | POA: Diagnosis not present

## 2023-05-21 DIAGNOSIS — Z79899 Other long term (current) drug therapy: Secondary | ICD-10-CM | POA: Diagnosis not present

## 2023-05-21 DIAGNOSIS — F17210 Nicotine dependence, cigarettes, uncomplicated: Secondary | ICD-10-CM | POA: Diagnosis present

## 2023-05-21 DIAGNOSIS — I472 Ventricular tachycardia, unspecified: Secondary | ICD-10-CM | POA: Diagnosis not present

## 2023-05-21 DIAGNOSIS — Z888 Allergy status to other drugs, medicaments and biological substances status: Secondary | ICD-10-CM

## 2023-05-21 DIAGNOSIS — I213 ST elevation (STEMI) myocardial infarction of unspecified site: Secondary | ICD-10-CM | POA: Diagnosis not present

## 2023-05-21 DIAGNOSIS — I2510 Atherosclerotic heart disease of native coronary artery without angina pectoris: Secondary | ICD-10-CM | POA: Diagnosis present

## 2023-05-21 DIAGNOSIS — I2102 ST elevation (STEMI) myocardial infarction involving left anterior descending coronary artery: Principal | ICD-10-CM | POA: Diagnosis present

## 2023-05-21 DIAGNOSIS — D649 Anemia, unspecified: Secondary | ICD-10-CM | POA: Diagnosis not present

## 2023-05-21 HISTORY — PX: LEFT HEART CATH AND CORONARY ANGIOGRAPHY: CATH118249

## 2023-05-21 HISTORY — PX: CORONARY/GRAFT ACUTE MI REVASCULARIZATION: CATH118305

## 2023-05-21 LAB — COMPREHENSIVE METABOLIC PANEL
ALT: 18 U/L (ref 0–44)
ALT: 19 U/L (ref 0–44)
AST: 38 U/L (ref 15–41)
AST: 55 U/L — ABNORMAL HIGH (ref 15–41)
Albumin: 3.5 g/dL (ref 3.5–5.0)
Albumin: 2.9 g/dL — ABNORMAL LOW (ref 3.5–5.0)
Alkaline Phosphatase: 56 U/L (ref 38–126)
Alkaline Phosphatase: 49 U/L (ref 38–126)
Anion gap: 10 (ref 5–15)
Anion gap: 8 (ref 5–15)
BUN: 5 mg/dL — ABNORMAL LOW (ref 6–20)
BUN: 7 mg/dL (ref 6–20)
CO2: 28 mmol/L (ref 22–32)
CO2: 25 mmol/L (ref 22–32)
Calcium: 8.4 mg/dL — ABNORMAL LOW (ref 8.9–10.3)
Calcium: 8.1 mg/dL — ABNORMAL LOW (ref 8.9–10.3)
Chloride: 100 mmol/L (ref 98–111)
Chloride: 103 mmol/L (ref 98–111)
Creatinine, Ser: 1.03 mg/dL (ref 0.61–1.24)
Creatinine, Ser: 1 mg/dL (ref 0.61–1.24)
GFR, Estimated: >60 mL/min (ref >60)
GFR, Estimated: >60 mL/min (ref >60)
Glucose, Bld: 123 mg/dL — ABNORMAL HIGH (ref 70–99)
Glucose, Bld: 114 mg/dL — ABNORMAL HIGH (ref 70–99)
Potassium: 3.1 mmol/L — ABNORMAL LOW (ref 3.5–5.1)
Potassium: 3.4 mmol/L — ABNORMAL LOW (ref 3.5–5.1)
Sodium: 138 mmol/L (ref 135–145)
Sodium: 136 mmol/L (ref 135–145)
Total Bilirubin: 0.8 mg/dL (ref 0.3–1.2)
Total Bilirubin: 0.8 mg/dL (ref 0.3–1.2)
Total Protein: 7.1 g/dL (ref 6.5–8.1)
Total Protein: 5.9 g/dL — ABNORMAL LOW (ref 6.5–8.1)

## 2023-05-21 LAB — CBC WITH DIFFERENTIAL/PLATELET
Abs Immature Granulocytes: 0.05 10*3/uL (ref 0.00–0.07)
Basophils Absolute: 0 10*3/uL (ref 0.0–0.1)
Basophils Relative: 0 %
Eosinophils Absolute: 0.2 10*3/uL (ref 0.0–0.5)
Eosinophils Relative: 2 %
HCT: 49.2 % (ref 39.0–52.0)
Hemoglobin: 16.7 g/dL (ref 13.0–17.0)
Immature Granulocytes: 1 %
Lymphocytes Relative: 25 %
Lymphs Abs: 2.8 10*3/uL (ref 0.7–4.0)
MCH: 35.2 pg — ABNORMAL HIGH (ref 26.0–34.0)
MCHC: 33.9 g/dL (ref 30.0–36.0)
MCV: 103.6 fL — ABNORMAL HIGH (ref 80.0–100.0)
Monocytes Absolute: 0.8 10*3/uL (ref 0.1–1.0)
Monocytes Relative: 7 %
Neutro Abs: 7.2 10*3/uL (ref 1.7–7.7)
Neutrophils Relative %: 65 %
Platelets: 359 10*3/uL (ref 150–400)
RBC: 4.75 MIL/uL (ref 4.22–5.81)
RDW: 13.7 % (ref 11.5–15.5)
WBC: 11.1 10*3/uL — ABNORMAL HIGH (ref 4.0–10.5)
nRBC: 0 % (ref 0.0–0.2)

## 2023-05-21 LAB — ECHOCARDIOGRAM COMPLETE
AR max vel: 2.58 cm2
AV Peak grad: 5.5 mm[Hg]
Ao pk vel: 1.17 m/s
Area-P 1/2: 3.77 cm2
Calc EF: 43.4 %
S' Lateral: 4.1 cm
Single Plane A2C EF: 44.5 %
Single Plane A4C EF: 43.9 %
Weight: 2871.27 [oz_av]

## 2023-05-21 LAB — PROTIME-INR
INR: 1 (ref 0.8–1.2)
Prothrombin Time: 13.1 s (ref 11.4–15.2)

## 2023-05-21 LAB — LIPID PANEL
Cholesterol: 154 mg/dL (ref 0–200)
HDL: 32 mg/dL — ABNORMAL LOW (ref >40)
LDL Cholesterol: 94 mg/dL (ref 0–99)
Total CHOL/HDL Ratio: 4.8 {ratio}
Triglycerides: 142 mg/dL (ref <150)
VLDL: 28 mg/dL (ref 0–40)

## 2023-05-21 LAB — APTT: aPTT: 28 s (ref 24–36)

## 2023-05-21 LAB — GLUCOSE, CAPILLARY: Glucose-Capillary: 98 mg/dL (ref 70–99)

## 2023-05-21 LAB — TROPONIN I (HIGH SENSITIVITY)
Troponin I (High Sensitivity): 2154 ng/L (ref <18)
Troponin I (High Sensitivity): 2345 ng/L (ref <18)

## 2023-05-21 LAB — MRSA NEXT GEN BY PCR, NASAL: MRSA by PCR Next Gen: NOT DETECTED

## 2023-05-21 LAB — HEMOGLOBIN A1C
Hgb A1c MFr Bld: 5.4 % (ref 4.8–5.6)
Mean Plasma Glucose: 108.28 mg/dL

## 2023-05-21 LAB — MAGNESIUM: Magnesium: 1.9 mg/dL (ref 1.7–2.4)

## 2023-05-21 SURGERY — CORONARY/GRAFT ACUTE MI REVASCULARIZATION
Anesthesia: Moderate Sedation

## 2023-05-21 MED ORDER — HEPARIN (PORCINE) IN NACL 1000-0.9 UT/500ML-% IV SOLN
INTRAVENOUS | Status: AC
  Filled 2023-05-21: qty 1000

## 2023-05-21 MED ORDER — MIDAZOLAM HCL 2 MG/2ML IJ SOLN
INTRAMUSCULAR | Status: AC
  Filled 2023-05-21: qty 2

## 2023-05-21 MED ORDER — METOPROLOL SUCCINATE ER 25 MG PO TB24
25.0000 mg | ORAL_TABLET | Freq: Every day | ORAL | Status: DC
  Administered 2023-05-21 – 2023-05-23 (×3): 25 mg via ORAL
  Filled 2023-05-21 (×3): qty 1

## 2023-05-21 MED ORDER — ONDANSETRON HCL 4 MG/2ML IJ SOLN: 4.0000 mg | Freq: Four times a day (QID) | INTRAMUSCULAR | Status: DC | PRN

## 2023-05-21 MED ORDER — MAGNESIUM SULFATE 50 % IJ SOLN
INTRAMUSCULAR | Status: AC
  Filled 2023-05-21: qty 2

## 2023-05-21 MED ORDER — POTASSIUM CHLORIDE CRYS ER 10 MEQ PO TBCR
EXTENDED_RELEASE_TABLET | ORAL | Status: DC | PRN
  Administered 2023-05-21: 40 meq via ORAL

## 2023-05-21 MED ORDER — POTASSIUM CHLORIDE CRYS ER 20 MEQ PO TBCR
EXTENDED_RELEASE_TABLET | ORAL | Status: AC
  Filled 2023-05-21: qty 2

## 2023-05-21 MED ORDER — HEPARIN BOLUS VIA INFUSION
4000.0000 [IU] | Freq: Once | INTRAVENOUS | Status: DC
  Filled 2023-05-21: qty 4000

## 2023-05-21 MED ORDER — ATORVASTATIN CALCIUM 80 MG PO TABS
80.0000 mg | ORAL_TABLET | Freq: Every day | ORAL | Status: DC
  Administered 2023-05-21 – 2023-05-23 (×3): 80 mg via ORAL
  Filled 2023-05-21 (×2): qty 4
  Filled 2023-05-21 (×2): qty 1

## 2023-05-21 MED ORDER — FENTANYL CITRATE (PF) 100 MCG/2ML IJ SOLN
INTRAMUSCULAR | Status: AC
  Filled 2023-05-21: qty 2

## 2023-05-21 MED ORDER — ASPIRIN 81 MG PO CHEW
324.0000 mg | CHEWABLE_TABLET | Freq: Once | ORAL | Status: AC
  Administered 2023-05-21: 324 mg via ORAL

## 2023-05-21 MED ORDER — PRASUGREL HCL 10 MG PO TABS
ORAL_TABLET | ORAL | Status: AC
  Filled 2023-05-21: qty 6

## 2023-05-21 MED ORDER — ORAL CARE MOUTH RINSE: 15.0000 mL | OROMUCOSAL | Status: DC | PRN

## 2023-05-21 MED ORDER — FENTANYL CITRATE (PF) 100 MCG/2ML IJ SOLN
INTRAMUSCULAR | Status: DC | PRN
  Administered 2023-05-21: 25 ug via INTRAVENOUS

## 2023-05-21 MED ORDER — LABETALOL HCL 5 MG/ML IV SOLN: 10.0000 mg | INTRAVENOUS | Status: AC | PRN

## 2023-05-21 MED ORDER — AMIODARONE HCL IN DEXTROSE 360-4.14 MG/200ML-% IV SOLN
INTRAVENOUS | Status: AC
  Administered 2023-05-21: 60 mg/h via INTRAVENOUS
  Filled 2023-05-21: qty 200

## 2023-05-21 MED ORDER — NICOTINE 21 MG/24HR TD PT24
21.0000 mg | MEDICATED_PATCH | Freq: Every day | TRANSDERMAL | Status: DC
  Administered 2023-05-21 – 2023-05-23 (×3): 21 mg via TRANSDERMAL
  Filled 2023-05-21 (×4): qty 1

## 2023-05-21 MED ORDER — IOHEXOL 350 MG/ML SOLN
INTRAVENOUS | Status: DC | PRN
  Administered 2023-05-21: 284 mL

## 2023-05-21 MED ORDER — HEPARIN SODIUM (PORCINE) 1000 UNIT/ML IJ SOLN
4000.0000 [IU] | Freq: Once | INTRAMUSCULAR | Status: AC
  Administered 2023-05-21: 4000 [IU] via INTRAVENOUS

## 2023-05-21 MED ORDER — PRASUGREL HCL 10 MG PO TABS
10.0000 mg | ORAL_TABLET | Freq: Every day | ORAL | Status: DC
  Administered 2023-05-22 – 2023-05-23 (×2): 10 mg via ORAL
  Filled 2023-05-21 (×2): qty 1

## 2023-05-21 MED ORDER — PRASUGREL HCL 10 MG PO TABS
ORAL_TABLET | ORAL | Status: AC
  Filled 2023-05-21: qty 1

## 2023-05-21 MED ORDER — MORPHINE SULFATE (PF) 4 MG/ML IV SOLN
4.0000 mg | INTRAVENOUS | Status: DC | PRN
  Administered 2023-05-21 (×2): 4 mg via INTRAVENOUS
  Filled 2023-05-21 (×2): qty 1

## 2023-05-21 MED ORDER — AMIODARONE IV BOLUS ONLY 150 MG/100ML
INTRAVENOUS | Status: AC
  Filled 2023-05-21: qty 100

## 2023-05-21 MED ORDER — HEPARIN (PORCINE) 25000 UT/250ML-% IV SOLN: 950.0000 [IU]/h | INTRAVENOUS | Status: DC

## 2023-05-21 MED ORDER — ACETAMINOPHEN 325 MG PO TABS
650.0000 mg | ORAL_TABLET | ORAL | Status: DC | PRN
  Filled 2023-05-21: qty 2

## 2023-05-21 MED ORDER — HEPARIN SODIUM (PORCINE) 5000 UNIT/ML IJ SOLN: 60.0000 [IU]/kg | Freq: Once | INTRAMUSCULAR | Status: DC

## 2023-05-21 MED ORDER — SODIUM CHLORIDE 0.9 % IV SOLN: 250.0000 mL | INTRAVENOUS | Status: AC | PRN

## 2023-05-21 MED ORDER — ACETAMINOPHEN 325 MG PO TABS
650.0000 mg | ORAL_TABLET | ORAL | Status: DC | PRN
  Administered 2023-05-22: 650 mg via ORAL

## 2023-05-21 MED ORDER — HEPARIN SODIUM (PORCINE) 5000 UNIT/ML IJ SOLN: 4000.0000 [IU] | Freq: Once | INTRAMUSCULAR | Status: DC

## 2023-05-21 MED ORDER — ASPIRIN 81 MG PO CHEW
81.0000 mg | CHEWABLE_TABLET | Freq: Every day | ORAL | Status: DC
  Administered 2023-05-22 – 2023-05-23 (×2): 81 mg via ORAL
  Filled 2023-05-21 (×2): qty 1

## 2023-05-21 MED ORDER — MAGNESIUM SULFATE 50 % IJ SOLN
INTRAMUSCULAR | Status: DC | PRN
  Administered 2023-05-21: 1 g via INTRAVENOUS

## 2023-05-21 MED ORDER — ASPIRIN 81 MG PO CHEW
CHEWABLE_TABLET | ORAL | Status: AC
  Filled 2023-05-21: qty 4

## 2023-05-21 MED ORDER — NITROGLYCERIN 0.4 MG SL SUBL
0.4000 mg | SUBLINGUAL_TABLET | SUBLINGUAL | Status: DC | PRN
  Administered 2023-05-21: 0.4 mg via SUBLINGUAL
  Filled 2023-05-21: qty 1

## 2023-05-21 MED ORDER — HEPARIN SODIUM (PORCINE) 5000 UNIT/ML IJ SOLN
INTRAMUSCULAR | Status: AC
  Filled 2023-05-21: qty 1

## 2023-05-21 MED ORDER — HEPARIN SODIUM (PORCINE) 1000 UNIT/ML IJ SOLN
INTRAMUSCULAR | Status: AC
  Filled 2023-05-21: qty 10

## 2023-05-21 MED ORDER — LISINOPRIL 5 MG PO TABS
2.5000 mg | ORAL_TABLET | Freq: Every day | ORAL | Status: DC
  Administered 2023-05-21 – 2023-05-23 (×3): 2.5 mg via ORAL
  Filled 2023-05-21 (×5): qty 1

## 2023-05-21 MED ORDER — SODIUM CHLORIDE 0.9% FLUSH
3.0000 mL | Freq: Two times a day (BID) | INTRAVENOUS | Status: DC
  Administered 2023-05-22 – 2023-05-23 (×3): 3 mL via INTRAVENOUS

## 2023-05-21 MED ORDER — AMIODARONE HCL IN DEXTROSE 360-4.14 MG/200ML-% IV SOLN: 60.0000 mg/h | INTRAVENOUS | Status: DC

## 2023-05-21 MED ORDER — SODIUM CHLORIDE 0.9% FLUSH: 3.0000 mL | INTRAVENOUS | Status: DC | PRN

## 2023-05-21 MED ORDER — AMIODARONE HCL IN DEXTROSE 360-4.14 MG/200ML-% IV SOLN: 30.0000 mg/h | INTRAVENOUS | Status: DC

## 2023-05-21 MED ORDER — HEPARIN (PORCINE) IN NACL 2000-0.9 UNIT/L-% IV SOLN
INTRAVENOUS | Status: DC | PRN
  Administered 2023-05-21: 1000 mL

## 2023-05-21 MED ORDER — PRASUGREL HCL 10 MG PO TABS
ORAL_TABLET | ORAL | Status: DC | PRN
  Administered 2023-05-21: 60 mg via ORAL

## 2023-05-21 MED ORDER — AMIODARONE LOAD VIA INFUSION
150.0000 mg | Freq: Once | INTRAVENOUS | Status: AC
  Administered 2023-05-21: 150 mg via INTRAVENOUS

## 2023-05-21 MED ORDER — VERAPAMIL HCL 2.5 MG/ML IV SOLN
INTRAVENOUS | Status: AC
  Filled 2023-05-21: qty 2

## 2023-05-21 MED ORDER — HEPARIN SODIUM (PORCINE) 1000 UNIT/ML IJ SOLN
INTRAMUSCULAR | Status: DC | PRN
  Administered 2023-05-21 (×2): 4000 [IU] via INTRAVENOUS

## 2023-05-21 MED ORDER — MIDAZOLAM HCL 2 MG/2ML IJ SOLN
INTRAMUSCULAR | Status: DC | PRN
  Administered 2023-05-21: 1 mg via INTRAVENOUS

## 2023-05-21 MED ORDER — LIDOCAINE HCL 1 % IJ SOLN
INTRAMUSCULAR | Status: AC
  Filled 2023-05-21: qty 20

## 2023-05-21 MED ORDER — AMIODARONE HCL IN DEXTROSE 360-4.14 MG/200ML-% IV SOLN
INTRAVENOUS | Status: AC
  Filled 2023-05-21: qty 200

## 2023-05-21 MED ORDER — SODIUM CHLORIDE 0.9 % WEIGHT BASED INFUSION
1.0000 mL/kg/h | INTRAVENOUS | Status: AC
  Administered 2023-05-21: 1 mL/kg/h via INTRAVENOUS

## 2023-05-21 MED ORDER — HYDRALAZINE HCL 20 MG/ML IJ SOLN: 10.0000 mg | INTRAMUSCULAR | Status: AC | PRN

## 2023-05-21 MED ORDER — ENOXAPARIN SODIUM 40 MG/0.4ML IJ SOSY
40.0000 mg | PREFILLED_SYRINGE | INTRAMUSCULAR | Status: DC
  Administered 2023-05-21: 40 mg via SUBCUTANEOUS
  Filled 2023-05-21: qty 0.4

## 2023-05-21 MED ORDER — VERAPAMIL HCL 2.5 MG/ML IV SOLN
INTRAVENOUS | Status: DC | PRN
  Administered 2023-05-21: 2.5 mg via INTRAVENOUS

## 2023-05-21 MED ORDER — CHLORHEXIDINE GLUCONATE CLOTH 2 % EX PADS
6.0000 | MEDICATED_PAD | Freq: Every day | CUTANEOUS | Status: DC
  Administered 2023-05-21: 6 via TOPICAL

## 2023-05-21 SURGICAL SUPPLY — 19 items
BALLN TREK RX 2.5X12 (BALLOONS) ×1
BALLN ~~LOC~~ TREK NEO RX 3.5X12 (BALLOONS) IMPLANT
BALLOON TREK RX 2.5X12 (BALLOONS) IMPLANT
CATH INFINITI 5FR ANG PIGTAIL (CATHETERS) IMPLANT
CATH INFINITI JR4 5F (CATHETERS) IMPLANT
CATH LAUNCHER 6FR EBU3.5 (CATHETERS) IMPLANT
DEVICE RAD TR BAND REGULAR (VASCULAR PRODUCTS) IMPLANT
DRAPE BRACHIAL (DRAPES) IMPLANT
GLIDESHEATH SLEND SS 6F .021 (SHEATH) IMPLANT
GUIDEWIRE INQWIRE 1.5J.035X260 (WIRE) IMPLANT
INQWIRE 1.5J .035X260CM (WIRE) ×1
KIT ENCORE 26 ADVANTAGE (KITS) IMPLANT
PACK CARDIAC CATH (CUSTOM PROCEDURE TRAY) ×2 IMPLANT
PROTECTION STATION PRESSURIZED (MISCELLANEOUS) ×1
SET ATX-X65L (MISCELLANEOUS) IMPLANT
STATION PROTECTION PRESSURIZED (MISCELLANEOUS) IMPLANT
STENT ONYX FRONTIER 3.0X15 (Permanent Stent) IMPLANT
TUBING CIL FLEX 10 FLL-RA (TUBING) IMPLANT
WIRE ASAHI PROWATER 180CM (WIRE) IMPLANT

## 2023-05-21 NOTE — ED Triage Notes (Signed)
Pt here with cp that started at 0500. Pt states pain is centered and radiates to his left arm. Pt describes pain as pressure.

## 2023-05-21 NOTE — Assessment & Plan Note (Signed)
1 pack/day smoker Discussed cessation Nicotine patch

## 2023-05-21 NOTE — Consult Note (Signed)
[facility] Cardiology  CARDIOLOGY CONSULT NOTE  Patient ID: Joshua Leblanc. MRN: [id number] DOB/AGE: 1975/08/12 47 y.o.  Admit date: 05/21/2023 Referring Physician Roxan Hockey Primary Physician  Primary Cardiologist  Reason for Consultation anterior STEMI  HPI: 47 year old gentleman referred for anterior STEMI.  The patient reports he was used today of health until 5:30 AM this morning when he experienced substernal pain described as pressure.  Symptoms persisted on and off and he drove himself to [facility] ED.  Initial ECG revealed diagnostic ST elevation in the anterior leads consistent with anterior ST elevation myocardial infarction.  The patient was emergently brought to the cardiac catheterization laboratory where coronary angiography revealed embolic 90% stenosis proximal LAD and chronically occluded mid RCA with right to right and left-to-right collaterals.  The patient underwent percutaneous coronary invention receiving 3.0 x 15 mm Onyx Frontier drug-eluting stent proximal LAD with an excellent angiographic result.  Left ventriculography revealed mild to moderate reduced left ventricular function with estimated LVEF 35-40%.  Review of systems complete and found to be negative unless listed above     Past Medical History:  Diagnosis Date   Anxiety    Depression    Headache    Neck pain     Past Surgical History:  Procedure Laterality Date   MELANOMA EXCISION     ORIF CARPAL BONE FRACTURE     TONSILLECTOMY AND ADENOIDECTOMY      Facility-Administered Medications Prior to Admission  Medication Dose Route Frequency Provider Last Rate Last Admin   clotrimazole (LOTRIMIN) 1 % cream   Topical BID Larene Pickett, FNP   Given at 05/07/20 1553   Medications Prior to Admission  Medication Sig Dispense Refill Last Dose   ibuprofen (ADVIL,MOTRIN) 800 MG tablet Take 1 tablet (800 mg total) by mouth every 8 (eight) hours as needed for mild pain or moderate pain. 15 tablet 0    predniSONE  (STERAPRED UNI-PAK 21 TAB) 10 MG (21) TBPK tablet Take by mouth daily. As directed 21 tablet 0    Social History   Socioeconomic History   Marital status: Single    Spouse name: Not on file   Number of children: Not on file   Years of education: Not on file   Highest education level: Not on file  Occupational History   Not on file  Tobacco Use   Smoking status: Every Day    Current packs/day: 1.00    Average packs/day: 1 pack/day for 15.0 years (15.0 ttl pk-yrs)    Types: Cigarettes   Smokeless tobacco: Never  Substance and Sexual Activity   Alcohol use: Yes    Alcohol/week: 0.0 standard drinks of alcohol    Comment: 1-2 /week   Drug use: Not Currently   Sexual activity: Yes    Partners: Female    Birth control/protection: Condom    Comment: I  Other Topics Concern   Not on file  Social History Narrative   Not on file   Social Determinants of Health   Financial Resource Strain: Low Risk  (09/03/2021)   Received from [facility], [facility]   Overall Financial Resource Strain (CARDIA)    Difficulty of Paying Living Expenses: Not very hard  Food Insecurity: No Food Insecurity (09/03/2021)   Received from [facility], [facility]   Hunger Vital Sign    Worried About Running Out of Food in the Last Year: Never true    Ran Out of Food in the Last Year: Never true  Transportation Needs: No Transportation Needs (09/03/2021)   Received from [facility], [facility]   [facility] - Transportation    Lack of Transportation (Medical): No    Lack of Transportation (Non-Medical): No  Physical Activity: Insufficiently Active (09/03/2021)   Received from [facility], [facility]   Exercise Vital Sign    Days of Exercise per Week: 2 days    Minutes of Exercise per Session: 30 min  Stress: Not on file  Social Connections: Unknown (07/22/2020)   Received from [facility], [facility]   Social Connections    Frequency of Communication with Friends  and Family: Not asked    Frequency of Social Gatherings with Friends and Family: Not asked  Intimate Partner Violence: Unknown (07/22/2020)   Received from [facility], [facility]   Intimate Partner Violence    Fear of Current or Ex-Partner: Not asked    Emotionally Abused: Not asked    Physically Abused: Not asked    Sexually Abused: Not asked    Family History  Problem Relation Age of Onset   Hypertension Mother    Cancer Father        melanoma      Review of systems complete and found to be negative unless listed above      PHYSICAL EXAM  General: Well developed, well nourished, in no acute distress HEENT:  Normocephalic and atramatic Neck:  No JVD.  Lungs: Clear bilaterally to auscultation and percussion. Heart: HRRR . Normal S1 and S2 without gallops or murmurs.  Abdomen: Bowel sounds are positive, abdomen soft and non-tender  Msk:  Back normal, normal gait. Normal strength and tone for age. Extremities: No clubbing, cyanosis or edema.   Neuro: Alert and oriented X 3. Psych:  Good affect, responds appropriately  Labs:   Lab Results  Component Value Date   WBC 11.1 (H) 05/21/2023   HGB 16.7 05/21/2023   HCT 49.2 05/21/2023   MCV 103.6 (H) 05/21/2023   PLT 359 05/21/2023    Recent Labs  Lab 05/21/23 0841  NA 138  K 3.1*  CL 100  CO2 28  BUN 5*  CREATININE 1.03  CALCIUM 8.4*  PROT 7.1  BILITOT 0.8  ALKPHOS 56  ALT 18  AST 38  GLUCOSE 123*   No results found for: "CKTOTAL", "CKMB", "CKMBINDEX", "TROPONINI"  Lab Results  Component Value Date   CHOL 154 05/21/2023   Lab Results  Component Value Date   HDL 32 (L) 05/21/2023   Lab Results  Component Value Date   LDLCALC 94 05/21/2023   Lab Results  Component Value Date   TRIG 142 05/21/2023   Lab Results  Component Value Date   CHOLHDL 4.8 05/21/2023   No results found for: "LDLDIRECT"    Radiology: [facility] Chest Port 1 View  Result Date: 05/21/2023 CLINICAL DATA:  Chest pain EXAM:  PORTABLE CHEST - 1 VIEW COMPARISON:  05/13/2023 FINDINGS: Lungs are clear. Heart size and mediastinal contours are within normal limits. No effusion. Visualized bones unremarkable. IMPRESSION: No acute cardiopulmonary disease. Electronically Signed   By: Corlis Leak M.D.   On: 05/21/2023 09:12   DG Chest 2 View  Result Date: 05/13/2023 CLINICAL DATA:  Productive cough and fever. EXAM: CHEST - 2 VIEW COMPARISON:  10/01/2010 FINDINGS: The heart size and mediastinal contours are within normal limits. Both lungs are clear. The visualized skeletal structures are unremarkable. IMPRESSION: No active cardiopulmonary disease. Electronically Signed   By: Danae Orleans  M.D.   On: 05/13/2023 10:18    EKG: Normal sinus rhythm with 3 to 4 mm ST elevation in leads V4 5 V6, and 1 to 2 mm ST elevation in leads I and aVL, with T wave inversions leads III and aVF.  ASSESSMENT AND PLAN:   1.  Anterior STEMI, with successful primary PCI, 2.0 x 15 mm Onyx frontier drug-eluting stent proximal LAD, with chronically occluded mid RCA with right to right left-to-right collaterals 2.  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  Tobacco abuse  Recommendations  1.  Dual antiplatelet therapy uninterrupted x 1 year 2.  Start high intensity atorvastatin 80 mg daily 3.  Start metoprolol succinate 25 mg daily 4.  Start low-dose lisinopril 2.5 mg daily 5.  2D echocardiogram 6.  Strongly advised patient to stop smoking    Signed: Marcina Millard MD,PhD, [facility] 05/21/2023, 10:42 AM

## 2023-05-21 NOTE — H&P (Addendum)
History and Physical    Patient: Joshua Leblanc. WUJ:[id number] DOB: July 04, 1976 DOA: 05/21/2023 DOS: the patient was seen and examined on 05/21/2023 PCP: [facility]  Patient coming from: Home  Chief Complaint:  Chief Complaint  Patient presents with   Chest Pain   HPI: Joshua Bais. is a 47 y.o. male with medical history significant of tobacco abuse, anxiety depression presenting with STEMI.  Patient reports having recurrent central chest pain since around 530 this morning.  Chest pain was moderate to severe in intensity.  Positive associated nausea and diaphoresis.  Patient states he went outside with the cool air and that seem to help chest pain to a degree.  Patient states he went back inside with symptoms returning.  EMS subsequently called with preliminary EKG concerning for anterior STEMI.  Patient denies any prior history of heart attacks in the past.  1 pack/day smoker.  Does drink regular beer.  Denies any known family history of coronary disease in the past.  No prior history of hypertension, diabetes or known heart failure.  No reported orthopnea or PND. Presented to the ER afebrile as a code STEMI.  Otherwise hemodynamically stable.  Labs grossly stable apart from white count 11.1, glucose 123, creatinine 1.03.  Troponin [id number]. EKG w/ anterior STEMI. CXR stable.  Review of Systems: As mentioned in the history of present illness. All other systems reviewed and are negative. Past Medical History:  Diagnosis Date   Anxiety    Depression    Headache    Neck pain    Past Surgical History:  Procedure Laterality Date   MELANOMA EXCISION     ORIF CARPAL BONE FRACTURE     TONSILLECTOMY AND ADENOIDECTOMY     Social History:  reports that he has been smoking cigarettes. He has a 15 pack-year smoking history. He has never used smokeless tobacco. He reports current alcohol use. He reports that he does not currently use drugs.  Allergies  Allergen  Reactions   Alprazolam Nausea Only    Black out and memory lost Black out and memory lost   Codeine Nausea Only    Family History  Problem Relation Age of Onset   Hypertension Mother    Cancer Father        melanoma    Prior to Admission medications   Medication Sig Start Date End Date Taking? Authorizing Provider  ibuprofen (ADVIL,MOTRIN) 800 MG tablet Take 1 tablet (800 mg total) by mouth every 8 (eight) hours as needed for mild pain or moderate pain. 01/28/16   Renford Dills, NP  predniSONE (STERAPRED UNI-PAK 21 TAB) 10 MG (21) TBPK tablet Take by mouth daily. As directed 05/13/23   Mickie Bail, NP    Physical Exam: Vitals:   05/21/23 1100 05/21/23 1115 05/21/23 1130 05/21/23 1145  BP:  131/84 125/87 (!) 129/96  Pulse: 73 81 73 74  Resp: 17 (!) 22 17 15   Temp:      TempSrc:      SpO2: 99% 100% 99% 99%  Weight:       Physical Exam Constitutional:      Appearance: He is normal weight.  HENT:     Head: Normocephalic and atraumatic.     Nose: Nose normal.     Mouth/Throat:     Mouth: Mucous membranes are moist.  Eyes:     Pupils: Pupils are equal, round, and reactive to light.  Cardiovascular:     Rate and Rhythm: Normal rate  and regular rhythm.  Pulmonary:     Effort: Pulmonary effort is normal.  Abdominal:     General: Bowel sounds are normal.  Musculoskeletal:        General: Normal range of motion.  Skin:    General: Skin is warm.  Neurological:     General: No focal deficit present.  Psychiatric:        Mood and Affect: Mood normal.     Data Reviewed:  There are no new results to review at this time.  CARDIAC CATHETERIZATION   Prox RCA lesion is 100% stenosed.   Prox LAD lesion is 90% stenosed.   Prox LAD to Mid LAD lesion is 30% stenosed.   A drug-eluting stent was successfully placed using a STENT ONYX  FRONTIER 3.0X15.   Post intervention, there is a 0% residual stenosis.   There is mild to moderate left ventricular systolic dysfunction.    The left ventricular ejection fraction is 35-45% by visual estimate.  1.  Anterior STEMI 2.  Two-vessel coronary artery disease with 90% thrombotic stenosis  proximal LAD (culprit lesion), and chronic 100% stenosis proximal RCA with  right to right and left-to-right collaterals 3.  Mild to moderate reduced left ventricular function with estimated LV  ejection fraction 35-40% 4.  Successful primary PCI with a 0.0 x 15 mm Onyx frontier DES proximal  LAD  Recommendations  1.  Dual antiplatelet therapy uninterrupted x 1 year 2.  Start high intensity atorvastatin 80 mg daily 3.  Start metoprolol succinate 25 mg daily 4.  Start low-dose lisinopril 2.5 mg daily 5.  2D echocardiogram DG Chest Port 1 View CLINICAL DATA:  Chest pain  EXAM: PORTABLE CHEST - 1 VIEW  COMPARISON:  05/13/2023  FINDINGS: Lungs are clear.  Heart size and mediastinal contours are within normal limits.  No effusion.  Visualized bones unremarkable.  IMPRESSION: No acute cardiopulmonary disease.  Electronically Signed   By: Corlis Leak M.D.   On: 05/21/2023 09:12  Lab Results  Component Value Date   WBC 11.1 (H) 05/21/2023   HGB 16.7 05/21/2023   HCT 49.2 05/21/2023   MCV 103.6 (H) 05/21/2023   PLT 359 05/21/2023   Last metabolic panel Lab Results  Component Value Date   GLUCOSE 123 (H) 05/21/2023   NA 138 05/21/2023   K 3.1 (L) 05/21/2023   CL 100 05/21/2023   CO2 28 05/21/2023   BUN 5 (L) 05/21/2023   CREATININE 1.03 05/21/2023   GFRNONAA >60 05/21/2023   CALCIUM 8.4 (L) 05/21/2023   PROT 7.1 05/21/2023   ALBUMIN 3.5 05/21/2023   BILITOT 0.8 05/21/2023   ALKPHOS 56 05/21/2023   AST 38 05/21/2023   ALT 18 05/21/2023   ANIONGAP 10 05/21/2023    Assessment and Plan: STEMI (ST elevation myocardial infarction) (HCC) Recurrent chest pain since this 5:30 morning with noted code STEMI on presentation Initial EKG with anterior STEMI Status post cardiac catheterizations with Dr.  Darrold Junker showing embolic 90% stenosis proximal LAD and chronically occluded mid RCA with DES being placed to LAD Started asa, prasugrel, high dose lipitor  2D ECHO pending  Follow up cardiology recommendations     Ischemic cardiomyopathy Intracardiac catheterizationLeft ventriculography revealed mild to moderate reduced left ventricular function with estimated LVEF 35-40% in the setting of acute STEMI with noted 90% stenosis in the proximal LAD as well as chronically occluded RCA Pending 2D echo Started on metoprolol, ACE inhibitor by cardiology Follow-up further recommendations    Tobacco abuse counseling  1 pack/day smoker Discussed cessation Nicotine patch    Greater than 50% was spent in counseling and coordination of care with patient Total encounter time 80 minutes or more   Advance Care Planning:   Code Status: Full Code   Consults: Cardiology   Family Communication: Sister and daughter at the bedside   Severity of Illness: The appropriate patient status for this patient is INPATIENT. Inpatient status is judged to be reasonable and necessary in order to provide the required intensity of service to ensure the patient's safety. The patient's presenting symptoms, physical exam findings, and initial radiographic and laboratory data in the context of their chronic comorbidities is felt to place them at high risk for further clinical deterioration. Furthermore, it is not anticipated that the patient will be medically stable for discharge from the hospital within 2 midnights of admission.   * I certify that at the point of admission it is my clinical judgment that the patient will require inpatient hospital care spanning beyond 2 midnights from the point of admission due to high intensity of service, high risk for further deterioration and high frequency of surveillance required.*  Author: Floydene Flock, MD 05/21/2023 11:58 AM  For on call review [email or web address].

## 2023-05-21 NOTE — ED Provider Notes (Signed)
[facility] Provider Note    Event Date/Time   First MD Initiated Contact with Patient 05/21/23 [phone]     (approximate)   History   Chest Pain   HPI  Joshua Harnish. is a 47 y.o. male history of smoking presents to the ER for evaluation of midsternal chest pain pressure with diaphoresis radiating to his left arm and jaw that started around 530 this morning.  Has never had pain like this before.  And felt normal yesterday.  Presented to the ER uncomfortable clutching chest.     Physical Exam   Triage Vital Signs: ED Triage Vitals [05/21/23 0837]  Encounter Vitals Group     BP      Systolic BP Percentile      Diastolic BP Percentile      Pulse      Resp      Temp      Temp src      SpO2      Weight      Height      Head Circumference      Peak Flow      Pain Score 10     Pain Loc      Pain Education      Exclude from Growth Chart     Most recent vital signs: Vitals:   05/21/23 0844 05/21/23 0845  BP: 111/82 111/82  Pulse: 69 68  Resp: 20 13  Temp: (!) 97.4 F (36.3 C)   SpO2:  99%     Constitutional: Alert  Eyes: Conjunctivae are normal.  Head: Atraumatic. Nose: No congestion/rhinnorhea. Mouth/Throat: Mucous membranes are moist.   Neck: Painless ROM.  Cardiovascular:   Good peripheral circulation. Respiratory: Normal respiratory effort.  No retractions.  Gastrointestinal: Soft and nontender.  Musculoskeletal:  no deformity Neurologic:  MAE spontaneously. No gross focal neurologic deficits are appreciated.  Skin:  Skin is warm, dry and intact. No rash noted. Psychiatric: Mood and affect are normal. Speech and behavior are normal.    ED Results / Procedures / Treatments   Labs (all labs ordered are listed, but only abnormal results are displayed) Labs Reviewed  CBC WITH DIFFERENTIAL/PLATELET - Abnormal; Notable for the following components:      Result Value   WBC 11.1 (*)    MCV 103.6 (*)    MCH 35.2 (*)     All other components within normal limits  HEMOGLOBIN A1C  PROTIME-INR  APTT  COMPREHENSIVE METABOLIC PANEL  LIPID PANEL  TROPONIN I (HIGH SENSITIVITY)     EKG  ED ECG REPORT I, Willy Eddy, the attending physician, personally viewed and interpreted this ECG.   Date: 05/21/2023  EKG Time: 8:35  Rate: 95  Rhythm: sinus  Axis: normal  Intervals:normal  ST&T Change: anterolateral stemi    RADIOLOGY Please see ED Course for my review and interpretation.  I personally reviewed all radiographic images ordered to evaluate for the above acute complaints and reviewed radiology reports and findings.  These findings were personally discussed with the patient.  Please see medical record for radiology report.    PROCEDURES:  Critical Care performed: Yes, see critical care procedure note(s)  .Critical Care  Performed by: Willy Eddy, MD Authorized by: Willy Eddy, MD   Critical care provider statement:    Critical care time (minutes):  30   Critical care was necessary to treat or prevent imminent or life-threatening deterioration of the following conditions:  Cardiac failure  Critical care was time spent personally by me on the following activities:  Ordering and performing treatments and interventions, ordering and review of laboratory studies, ordering and review of radiographic studies, pulse oximetry, re-evaluation of patient's condition, review of old charts, obtaining history from patient or surrogate, examination of patient, evaluation of patient's response to treatment, discussions with primary provider, discussions with consultants and development of treatment plan with patient or surrogate    MEDICATIONS ORDERED IN ED: Medications  morphine (PF) 4 MG/ML injection 4 mg (4 mg Intravenous Given 05/21/23 0854)  amiodarone (NEXTERONE) 1.8 mg/mL load via infusion 150 mg (has no administration in time range)    Followed by  amiodarone (NEXTERONE PREMIX)  360-4.14 MG/200ML-% (1.8 mg/mL) IV infusion (has no administration in time range)    Followed by  amiodarone (NEXTERONE PREMIX) 360-4.14 MG/200ML-% (1.8 mg/mL) IV infusion (has no administration in time range)  aspirin chewable tablet 324 mg (324 mg Oral Given 05/21/23 0845)  heparin sodium (porcine) injection 4,000 Units (4,000 Units Intravenous Given 05/21/23 0854)     IMPRESSION / MDM / ASSESSMENT AND PLAN / ED COURSE  I reviewed the triage vital signs and the nursing notes.                              Differential diagnosis includes, but is not limited to, ACS, pericarditis, esophagitis, boerhaaves, pe, dissection, pna, bronchitis, costochondritis  Patient presenting to the ER for evaluation of symptoms as described above.  Based on symptoms, risk factors and considered above differential, this presenting complaint could reflect a potentially life-threatening illness therefore the patient will be placed on continuous pulse oximetry and telemetry for monitoring.  Laboratory evaluation will be sent to evaluate for the above complaints.  Patient's EKG and presentation is consistent with STEMI.  Code STEMI was paged immediately upon review of EKG.   Clinical Course as of 05/21/23 8657  Sat May 21, 2023  8469 Patient has been evaluated by cardiology at bedside.  Patient taken urgently to cath for PCI.  Remains hemodynamically stable. [PR]    Clinical Course User Index [PR] Willy Eddy, MD     FINAL CLINICAL IMPRESSION(S) / ED DIAGNOSES   Final diagnoses:  ST elevation myocardial infarction (STEMI), unspecified artery (HCC)     Rx / DC Orders   ED Discharge Orders     None        Note:  This document was prepared using Dragon voice recognition software and may include unintentional dictation errors.    Willy Eddy, MD 05/21/23 [phone]

## 2023-05-21 NOTE — Assessment & Plan Note (Signed)
Intracardiac catheterizationLeft ventriculography revealed mild to moderate reduced left ventricular function with estimated LVEF 35-40% in the setting of acute STEMI with noted 90% stenosis in the proximal LAD as well as chronically occluded RCA Pending 2D echo Started on metoprolol, ACE inhibitor by cardiology Follow-up further recommendations

## 2023-05-21 NOTE — Assessment & Plan Note (Signed)
Recurrent chest pain since this 5:30 morning with noted code STEMI on presentation Initial EKG with anterior STEMI Status post cardiac catheterizations with Dr. Darrold Junker showing embolic 90% stenosis proximal LAD and chronically occluded mid RCA with DES being placed to LAD Started asa, prasugrel, high dose lipitor  2D ECHO pending  Follow up cardiology recommendations

## 2023-05-21 NOTE — Progress Notes (Signed)
Multi beat run of V. tach Asymptomatic Check K and mag Replete as needed Follow

## 2023-05-21 NOTE — ED Notes (Signed)
Pt's daughter called per pt request.

## 2023-05-21 NOTE — Progress Notes (Signed)
  Echocardiogram 2D Echocardiogram has been performed.  Lenor Coffin 05/21/2023, 11:53 AM

## 2023-05-21 NOTE — ED Notes (Signed)
Pt transported to cath lab at this time.

## 2023-05-21 NOTE — Plan of Care (Signed)
  Problem: Clinical Measurements: Goal: Ability to maintain clinical measurements within normal limits will improve Outcome: Progressing Goal: Will remain free from infection Outcome: Progressing Goal: Cardiovascular complication will be avoided Outcome: Progressing   Problem: Nutrition: Goal: Adequate nutrition will be maintained Outcome: Progressing   Problem: Elimination: Goal: Will not experience complications related to urinary retention Outcome: Progressing   Problem: Pain Managment: Goal: General experience of comfort will improve Outcome: Progressing   Problem: Safety: Goal: Ability to remain free from injury will improve Outcome: Progressing   Problem: Skin Integrity: Goal: Risk for impaired skin integrity will decrease Outcome: Progressing

## 2023-05-21 NOTE — ED Notes (Signed)
Dr. Cassie Freer with cardiology at bedside at this time.

## 2023-05-22 ENCOUNTER — Other Ambulatory Visit: Payer: Self-pay

## 2023-05-22 DIAGNOSIS — E876 Hypokalemia: Secondary | ICD-10-CM | POA: Diagnosis present

## 2023-05-22 DIAGNOSIS — R079 Chest pain, unspecified: Secondary | ICD-10-CM

## 2023-05-22 DIAGNOSIS — I2102 ST elevation (STEMI) myocardial infarction involving left anterior descending coronary artery: Principal | ICD-10-CM

## 2023-05-22 LAB — BASIC METABOLIC PANEL
Anion gap: 9 (ref 5–15)
BUN: 8 mg/dL (ref 6–20)
CO2: 26 mmol/L (ref 22–32)
Calcium: 8.1 mg/dL — ABNORMAL LOW (ref 8.9–10.3)
Chloride: 104 mmol/L (ref 98–111)
Creatinine, Ser: 0.94 mg/dL (ref 0.61–1.24)
GFR, Estimated: >60 mL/min (ref >60)
Glucose, Bld: 112 mg/dL — ABNORMAL HIGH (ref 70–99)
Potassium: 3.1 mmol/L — ABNORMAL LOW (ref 3.5–5.1)
Sodium: 139 mmol/L (ref 135–145)

## 2023-05-22 LAB — CBC
HCT: 42.9 % (ref 39.0–52.0)
Hemoglobin: 14.6 g/dL (ref 13.0–17.0)
MCH: 34.6 pg — ABNORMAL HIGH (ref 26.0–34.0)
MCHC: 34 g/dL (ref 30.0–36.0)
MCV: 101.7 fL — ABNORMAL HIGH (ref 80.0–100.0)
Platelets: 317 10*3/uL (ref 150–400)
RBC: 4.22 MIL/uL (ref 4.22–5.81)
RDW: 13.7 % (ref 11.5–15.5)
WBC: 12.3 10*3/uL — ABNORMAL HIGH (ref 4.0–10.5)
nRBC: 0 % (ref 0.0–0.2)

## 2023-05-22 LAB — HIV ANTIBODY (ROUTINE TESTING W REFLEX): HIV Screen 4th Generation wRfx: NONREACTIVE

## 2023-05-22 MED ORDER — CHLORHEXIDINE GLUCONATE CLOTH 2 % EX PADS
6.0000 | MEDICATED_PAD | Freq: Every day | CUTANEOUS | Status: DC
  Administered 2023-05-22: 6 via TOPICAL

## 2023-05-22 MED ORDER — POTASSIUM CHLORIDE CRYS ER 20 MEQ PO TBCR
40.0000 meq | EXTENDED_RELEASE_TABLET | ORAL | Status: AC
  Administered 2023-05-22 (×2): 40 meq via ORAL
  Filled 2023-05-22 (×2): qty 2

## 2023-05-22 MED ORDER — MAGNESIUM SULFATE 2 GM/50ML IV SOLN
2.0000 g | Freq: Once | INTRAVENOUS | Status: AC
  Administered 2023-05-22: 2 g via INTRAVENOUS
  Filled 2023-05-22: qty 50

## 2023-05-22 NOTE — Progress Notes (Addendum)
Progress Note    Joshua Leblanc.  ZOX:[id number] DOB: [birth year]  DOA: 05/21/2023 PCP: [facility]      Brief Narrative:    Medical records reviewed and are as summarized below:  Joshua Leblanc. is a 47 y.o. male with medical history significant for depression, anxiety, neck pain, headache, tobacco use disorder, who presented to the hospital with chest pain.  He was found to have acute ST elevation MI.  He underwent emergent left heart catheterization.       Assessment/Plan:   Principal Problem:   Acute ST elevation myocardial infarction (STEMI) involving left anterior descending (LAD) coronary artery (HCC) Active Problems:   STEMI (ST elevation myocardial infarction) (HCC)   Ischemic cardiomyopathy   Tobacco abuse counseling    Body mass index is 24.91 kg/m.   Acute ST elevation MI: S/p left heart cath which showed 90% stenosis proximal LAD with chronically occluded mid RCA with collaterals.  Drug-eluting stent was placed to proximal LAD.  Continue metoprolol, lisinopril, high-dose Lipitor, aspirin and prasugrel. Lipid panel showed cholesterol 154, triglycerides 142, HDL 32 and LDL 94. Hemoglobin A1c was 5.4.   Ischemic cardiomyopathy: Continue metoprolol and lisinopril.  2D echo showed EF estimated at 40 to 45%.   Nonsustained ventricular tachycardia: Continue low-dose metoprolol.   Hypokalemia: K was 3.1.  Replete potassium and monitor levels. Anemia was 1.9.  Replete with IV magnesium sulfate.   Tobacco use disorder: Patient has been advised to stop smoking cigarettes.   All new medications were discussed with the patient and his family at the bedside.  Transfer from stepdown unit to progressive cardiac unit   Diet Order             Diet Heart Room service appropriate? Yes; Fluid consistency: Thin  Diet effective now                            Consultants: Cardiologist  Procedures: Left heart  cath    Medications:    aspirin  81 mg Oral Daily   atorvastatin  80 mg Oral Daily   Chlorhexidine Gluconate Cloth  6 each Topical Daily   enoxaparin (LOVENOX) injection  40 mg Subcutaneous Q24H   lisinopril  2.5 mg Oral Daily   metoprolol succinate  25 mg Oral Daily   nicotine  21 mg Transdermal Daily   potassium chloride  40 mEq Oral Q4H   prasugrel  10 mg Oral Daily   sodium chloride flush  3 mL Intravenous Q12H   Continuous Infusions:  sodium chloride     magnesium sulfate bolus IVPB       Anti-infectives (From admission, onward)    None              Family Communication/Anticipated D/C date and plan/Code Status   DVT prophylaxis: enoxaparin (LOVENOX) injection 40 mg Start: 05/21/23 1130 SCDs Start: 05/21/23 1040     Code Status: Full Code  Family Communication: Discussed with his girlfriend, his daughter and his sister at the bedside. Disposition Plan: Plan to discharge home tomorrow   Status is: Inpatient Remains inpatient appropriate because: S/p left heart cath for STEMI       Subjective:   Interval events noted.  No chest pain or shortness of breath.  He feels better.  His girlfriend, daughter and sister were at the bedside  Objective:    Vitals:   05/22/23 0400 05/22/23 0600 05/22/23 0700  05/22/23 0800  BP: 114/75 109/69  120/80  Pulse: 71 66 65 93  Resp: (!) 23 (!) 21 19 (!) 28  Temp: 98.3 F (36.8 C)   98.5 F (36.9 C)  TempSrc: Oral   Oral  SpO2: 94% 96% 97% 96%  Weight:      Height:       No data found.   Intake/Output Summary (Last 24 hours) at 05/22/2023 0849 Last data filed at 05/22/2023 0534 Gross per 24 hour  Intake 1243.63 ml  Output 575 ml  Net 668.63 ml   Filed Weights   05/21/23 0845 05/21/23 1021 05/21/23 1200  Weight: 81.3 kg 81.4 kg 81 kg    Exam:  GEN: NAD SKIN: No rash EYES: EOMI ENT: MMM CV: RRR PULM: CTA B ABD: soft, ND, NT, +BS CNS: AAO x 3, non focal EXT: No edema or  tenderness        Data Reviewed:   I have personally reviewed following labs and imaging studies:  Labs: Labs show the following:   Basic Metabolic Panel: Recent Labs  Lab 05/21/23 0841 05/21/23 1509 05/22/23 0340  NA 138 136 139  K 3.1* 3.4* 3.1*  CL 100 103 104  CO2 28 25 26   GLUCOSE 123* 114* 112*  BUN 5* 7 8  CREATININE 1.03 1.00 0.94  CALCIUM 8.4* 8.1* 8.1*  MG  --  1.9  --    GFR Estimated Creatinine Clearance: 104.6 mL/min (by C-G formula based on SCr of 0.94 mg/dL). Liver Function Tests: Recent Labs  Lab 05/21/23 0841 05/21/23 1509  AST 38 55*  ALT 18 19  ALKPHOS 56 49  BILITOT 0.8 0.8  PROT 7.1 5.9*  ALBUMIN 3.5 2.9*   No results for input(s): "LIPASE", "AMYLASE" in the last 168 hours. No results for input(s): "AMMONIA" in the last 168 hours. Coagulation profile Recent Labs  Lab 05/21/23 0841  INR 1.0    CBC: Recent Labs  Lab 05/21/23 0841 05/22/23 0340  WBC 11.1* 12.3*  NEUTROABS 7.2  --   HGB 16.7 14.6  HCT 49.2 42.9  MCV 103.6* 101.7*  PLT 359 317   Cardiac Enzymes: No results for input(s): "CKTOTAL", "CKMB", "CKMBINDEX", "TROPONINI" in the last 168 hours. BNP (last 3 results) No results for input(s): "PROBNP" in the last 8760 hours. CBG: Recent Labs  Lab 05/21/23 1056  GLUCAP 98   D-Dimer: No results for input(s): "DDIMER" in the last 72 hours. Hgb A1c: Recent Labs    05/21/23 0841  HGBA1C 5.4   Lipid Profile: Recent Labs    05/21/23 0841  CHOL 154  HDL 32*  LDLCALC 94  TRIG 811  CHOLHDL 4.8   Thyroid function studies: No results for input(s): "TSH", "T4TOTAL", "T3FREE", "THYROIDAB" in the last 72 hours.  Invalid input(s): "FREET3" Anemia work up: No results for input(s): "VITAMINB12", "FOLATE", "FERRITIN", "TIBC", "IRON", "RETICCTPCT" in the last 72 hours. Sepsis Labs: Recent Labs  Lab 05/21/23 0841 05/22/23 0340  WBC 11.1* 12.3*    Microbiology Recent Results (from the past 240 hour(s))  MRSA  Next Gen by PCR, Nasal     Status: None   Collection Time: 05/21/23 10:48 AM   Specimen: Nasal Mucosa; Nasal Swab  Result Value Ref Range Status   MRSA by PCR Next Gen NOT DETECTED NOT DETECTED Final    Comment: (NOTE) The GeneXpert MRSA Assay (FDA approved for NASAL specimens only), is one component of a comprehensive MRSA colonization surveillance program. It is not intended to diagnose  MRSA infection nor to guide or monitor treatment for MRSA infections. Test performance is not FDA approved in patients less than 31 years old. Performed at [facility], [address], [place], [place] [address]     Procedures and diagnostic studies:  ECHOCARDIOGRAM COMPLETE  Result Date: 05/21/2023    ECHOCARDIOGRAM REPORT   Patient Name:   Joshua Petrosian. Date of Exam: 05/21/2023 Medical Rec #:  [id number]               Height:       71.0 in Accession #:    [id number]              Weight:       179.5 lb Date of Birth:  November 12, 1975               BSA:          2.014 m Patient Age:    47 years                BP:           123/85 mmHg Patient Gender: M                       HR:           77 bpm. Exam Location:  [facility] Procedure: 2D Echo Indications:     AMI I21.9  History:         Patient has no prior history of Echocardiogram examinations.  Sonographer:     Overton Mam RDCS, FASE Referring Phys:  [id number] Lyn Hollingshead PARASCHOS Diagnosing Phys: Marcina Millard MD IMPRESSIONS  1. Left ventricular ejection fraction, by estimation, is 40 to 45%. The left ventricle has mildly decreased function. The left ventricle demonstrates regional wall motion abnormalities (see scoring diagram/findings for description). Left ventricular diastolic parameters were normal.  2. Right ventricular systolic function is normal. The right ventricular size is normal.  3. The mitral valve is normal in structure. Mild mitral valve regurgitation. No evidence of mitral stenosis.  4. The aortic valve is normal in structure.  Aortic valve regurgitation is not visualized. No aortic stenosis is present.  5. The inferior vena cava is normal in size with greater than 50% respiratory variability, suggesting right atrial pressure of 3 mmHg. FINDINGS  Left Ventricle: Left ventricular ejection fraction, by estimation, is 40 to 45%. The left ventricle has mildly decreased function. The left ventricle demonstrates regional wall motion abnormalities. The left ventricular internal cavity size was normal in size. There is no left ventricular hypertrophy. Left ventricular diastolic parameters were normal.  LV Wall Scoring: The apical lateral segment, apical septal segment, and apex are hypokinetic. Right Ventricle: The right ventricular size is normal. No increase in right ventricular wall thickness. Right ventricular systolic function is normal. Left Atrium: Left atrial size was normal in size. Right Atrium: Right atrial size was normal in size. Pericardium: There is no evidence of pericardial effusion. Mitral Valve: The mitral valve is normal in structure. Mild mitral valve regurgitation. No evidence of mitral valve stenosis. Tricuspid Valve: The tricuspid valve is normal in structure. Tricuspid valve regurgitation is not demonstrated. No evidence of tricuspid stenosis. Aortic Valve: The aortic valve is normal in structure. Aortic valve regurgitation is not visualized. No aortic stenosis is present. Aortic valve peak gradient measures 5.5 mmHg. Pulmonic Valve: The pulmonic valve was normal in structure. Pulmonic valve regurgitation is not visualized. No evidence of pulmonic stenosis. Aorta: The aortic  root is normal in size and structure. Venous: The inferior vena cava is normal in size with greater than 50% respiratory variability, suggesting right atrial pressure of 3 mmHg. IAS/Shunts: No atrial level shunt detected by color flow Doppler.  LEFT VENTRICLE PLAX 2D LVIDd:         5.40 cm      Diastology LVIDs:         4.10 cm      LV e' medial:     8.81 cm/s LV PW:         1.00 cm      LV E/e' medial:  8.5 LV IVS:        1.00 cm      LV e' lateral:   14.70 cm/s LVOT diam:     2.10 cm      LV E/e' lateral: 5.1 LV SV:         56 LV SV Index:   28 LVOT Area:     3.46 cm  LV Volumes (MOD) LV vol d, MOD A2C: 99.6 ml LV vol d, MOD A4C: 118.0 ml LV vol s, MOD A2C: 55.3 ml LV vol s, MOD A4C: 66.2 ml LV SV MOD A2C:     44.3 ml LV SV MOD A4C:     118.0 ml LV SV MOD BP:      47.3 ml RIGHT VENTRICLE RV Basal diam:  2.50 cm RV S prime:     16.10 cm/s TAPSE (M-mode): 2.4 cm LEFT ATRIUM           Index        RIGHT ATRIUM          Index LA diam:      3.40 cm 1.69 cm/m   RA Area:     9.89 cm LA Vol (A2C): 29.3 ml 14.55 ml/m  RA Volume:   20.00 ml 9.93 ml/m LA Vol (A4C): 36.2 ml 17.98 ml/m  AORTIC VALVE                 PULMONIC VALVE AV Area (Vmax): 2.58 cm     PV Vmax:        0.90 m/s AV Vmax:        117.00 cm/s  PV Peak grad:   3.2 mmHg AV Peak Grad:   5.5 mmHg     RVOT Peak grad: 2 mmHg LVOT Vmax:      87.00 cm/s LVOT Vmean:     56.600 cm/s LVOT VTI:       0.161 m  AORTA Ao Root diam: 3.20 cm Ao Asc diam:  3.10 cm MITRAL VALVE MV Area (PHT): 3.77 cm    SHUNTS MV Decel Time: 201 msec    Systemic VTI:  0.16 m MV E velocity: 74.90 cm/s  Systemic Diam: 2.10 cm MV A velocity: 75.50 cm/s MV E/A ratio:  0.99 Marcina Millard MD Electronically signed by Marcina Millard MD Signature Date/Time: 05/21/2023/3:04:35 PM    Final    CARDIAC CATHETERIZATION  Result Date: 05/21/2023   Prox RCA lesion is 100% stenosed.   Prox LAD lesion is 90% stenosed.   Prox LAD to Mid LAD lesion is 30% stenosed.   A drug-eluting stent was successfully placed using a STENT ONYX FRONTIER 3.0X15.   Post intervention, there is a 0% residual stenosis.   There is mild to moderate left ventricular systolic dysfunction.   The left ventricular ejection fraction is 35-45% by visual estimate. 1.  Anterior STEMI 2.  Two-vessel  coronary artery disease with 90% thrombotic stenosis proximal LAD  (culprit lesion), and chronic 100% stenosis proximal RCA with right to right and left-to-right collaterals 3.  Mild to moderate reduced left ventricular function with estimated LV ejection fraction 35-40% 4.  Successful primary PCI with a 0.0 x 15 mm Onyx frontier DES proximal LAD Recommendations 1.  Dual antiplatelet therapy uninterrupted x 1 year 2.  Start high intensity atorvastatin 80 mg daily 3.  Start metoprolol succinate 25 mg daily 4.  Start low-dose lisinopril 2.5 mg daily 5.  2D echocardiogram   DG Chest Port 1 View  Result Date: 05/21/2023 CLINICAL DATA:  Chest pain EXAM: PORTABLE CHEST - 1 VIEW COMPARISON:  05/13/2023 FINDINGS: Lungs are clear. Heart size and mediastinal contours are within normal limits. No effusion. Visualized bones unremarkable. IMPRESSION: No acute cardiopulmonary disease. Electronically Signed   By: Corlis Leak M.D.   On: 05/21/2023 09:12               LOS: 1 day   Joshua Leblanc  [facility]   Pager on [email or web address]. If 7PM-7AM, please contact night-coverage at [email or web address]     05/22/2023, 8:49 AM

## 2023-05-22 NOTE — Progress Notes (Signed)
[facility] Cardiology  SUBJECTIVE: Patient laying in bed, denies chest pain or shortness of breath   Vitals:   05/22/23 0400 05/22/23 0600 05/22/23 0700 05/22/23 0800  BP: 114/75 109/69  120/80  Pulse: 71 66 65 93  Resp: (!) 23 (!) 21 19 (!) 28  Temp: 98.3 F (36.8 C)   98.5 F (36.9 C)  TempSrc: Oral   Oral  SpO2: 94% 96% 97% 96%  Weight:      Height:         Intake/Output Summary (Last 24 hours) at 05/22/2023 0858 Last data filed at 05/22/2023 0534 Gross per 24 hour  Intake 1243.63 ml  Output 575 ml  Net 668.63 ml      PHYSICAL EXAM  General: Well developed, well nourished, in no acute distress HEENT:  Normocephalic and atramatic Neck:  No JVD.  Lungs: Clear bilaterally to auscultation and percussion. Heart: HRRR . Normal S1 and S2 without gallops or murmurs.  Abdomen: Bowel sounds are positive, abdomen soft and non-tender  Msk:  Back normal, normal gait. Normal strength and tone for age. Extremities: No clubbing, cyanosis or edema.   Neuro: Alert and oriented X 3. Psych:  Good affect, responds appropriately   LABS: Basic Metabolic Panel: Recent Labs    05/21/23 1509 05/22/23 0340  NA 136 139  K 3.4* 3.1*  CL 103 104  CO2 25 26  GLUCOSE 114* 112*  BUN 7 8  CREATININE 1.00 0.94  CALCIUM 8.1* 8.1*  MG 1.9  --    Liver Function Tests: Recent Labs    05/21/23 0841 05/21/23 1509  AST 38 55*  ALT 18 19  ALKPHOS 56 49  BILITOT 0.8 0.8  PROT 7.1 5.9*  ALBUMIN 3.5 2.9*   No results for input(s): "LIPASE", "AMYLASE" in the last 72 hours. CBC: Recent Labs    05/21/23 0841 05/22/23 0340  WBC 11.1* 12.3*  NEUTROABS 7.2  --   HGB 16.7 14.6  HCT 49.2 42.9  MCV 103.6* 101.7*  PLT 359 317   Cardiac Enzymes: No results for input(s): "CKTOTAL", "CKMB", "CKMBINDEX", "TROPONINI" in the last 72 hours. BNP: Invalid input(s): "POCBNP" D-Dimer: No results for input(s): "DDIMER" in the last 72 hours. Hemoglobin A1C: Recent Labs    05/21/23 0841  HGBA1C  5.4   Fasting Lipid Panel: Recent Labs    05/21/23 0841  CHOL 154  HDL 32*  LDLCALC 94  TRIG 213  CHOLHDL 4.8   Thyroid Function Tests: No results for input(s): "TSH", "T4TOTAL", "T3FREE", "THYROIDAB" in the last 72 hours.  Invalid input(s): "FREET3" Anemia Panel: No results for input(s): "VITAMINB12", "FOLATE", "FERRITIN", "TIBC", "IRON", "RETICCTPCT" in the last 72 hours.  ECHOCARDIOGRAM COMPLETE  Result Date: 05/21/2023    ECHOCARDIOGRAM REPORT   Patient Name:   Joshua Leblanc. Date of Exam: 05/21/2023 Medical Rec #:  [id number]               Height:       71.0 in Accession #:    [id number]              Weight:       179.5 lb Date of Birth:  Oct 30, 1975               BSA:          2.014 m Patient Age:    47 years                BP:  123/85 mmHg Patient Gender: M                       HR:           77 bpm. Exam Location:  [facility] Procedure: 2D Echo Indications:     AMI I21.9  History:         Patient has no prior history of Echocardiogram examinations.  Sonographer:     Overton Mam RDCS, FASE Referring Phys:  [id number] Lyn Hollingshead Martrice Apt Diagnosing Phys: Marcina Millard MD IMPRESSIONS  1. Left ventricular ejection fraction, by estimation, is 40 to 45%. The left ventricle has mildly decreased function. The left ventricle demonstrates regional wall motion abnormalities (see scoring diagram/findings for description). Left ventricular diastolic parameters were normal.  2. Right ventricular systolic function is normal. The right ventricular size is normal.  3. The mitral valve is normal in structure. Mild mitral valve regurgitation. No evidence of mitral stenosis.  4. The aortic valve is normal in structure. Aortic valve regurgitation is not visualized. No aortic stenosis is present.  5. The inferior vena cava is normal in size with greater than 50% respiratory variability, suggesting right atrial pressure of 3 mmHg. FINDINGS  Left Ventricle: Left ventricular ejection fraction,  by estimation, is 40 to 45%. The left ventricle has mildly decreased function. The left ventricle demonstrates regional wall motion abnormalities. The left ventricular internal cavity size was normal in size. There is no left ventricular hypertrophy. Left ventricular diastolic parameters were normal.  LV Wall Scoring: The apical lateral segment, apical septal segment, and apex are hypokinetic. Right Ventricle: The right ventricular size is normal. No increase in right ventricular wall thickness. Right ventricular systolic function is normal. Left Atrium: Left atrial size was normal in size. Right Atrium: Right atrial size was normal in size. Pericardium: There is no evidence of pericardial effusion. Mitral Valve: The mitral valve is normal in structure. Mild mitral valve regurgitation. No evidence of mitral valve stenosis. Tricuspid Valve: The tricuspid valve is normal in structure. Tricuspid valve regurgitation is not demonstrated. No evidence of tricuspid stenosis. Aortic Valve: The aortic valve is normal in structure. Aortic valve regurgitation is not visualized. No aortic stenosis is present. Aortic valve peak gradient measures 5.5 mmHg. Pulmonic Valve: The pulmonic valve was normal in structure. Pulmonic valve regurgitation is not visualized. No evidence of pulmonic stenosis. Aorta: The aortic root is normal in size and structure. Venous: The inferior vena cava is normal in size with greater than 50% respiratory variability, suggesting right atrial pressure of 3 mmHg. IAS/Shunts: No atrial level shunt detected by color flow Doppler.  LEFT VENTRICLE PLAX 2D LVIDd:         5.40 cm      Diastology LVIDs:         4.10 cm      LV e' medial:    8.81 cm/s LV PW:         1.00 cm      LV E/e' medial:  8.5 LV IVS:        1.00 cm      LV e' lateral:   14.70 cm/s LVOT diam:     2.10 cm      LV E/e' lateral: 5.1 LV SV:         56 LV SV Index:   28 LVOT Area:     3.46 cm  LV Volumes (MOD) LV vol d, MOD A2C: 99.6 ml LV vol  d, MOD A4C: 118.0  ml LV vol s, MOD A2C: 55.3 ml LV vol s, MOD A4C: 66.2 ml LV SV MOD A2C:     44.3 ml LV SV MOD A4C:     118.0 ml LV SV MOD BP:      47.3 ml RIGHT VENTRICLE RV Basal diam:  2.50 cm RV S prime:     16.10 cm/s TAPSE (M-mode): 2.4 cm LEFT ATRIUM           Index        RIGHT ATRIUM          Index LA diam:      3.40 cm 1.69 cm/m   RA Area:     9.89 cm LA Vol (A2C): 29.3 ml 14.55 ml/m  RA Volume:   20.00 ml 9.93 ml/m LA Vol (A4C): 36.2 ml 17.98 ml/m  AORTIC VALVE                 PULMONIC VALVE AV Area (Vmax): 2.58 cm     PV Vmax:        0.90 m/s AV Vmax:        117.00 cm/s  PV Peak grad:   3.2 mmHg AV Peak Grad:   5.5 mmHg     RVOT Peak grad: 2 mmHg LVOT Vmax:      87.00 cm/s LVOT Vmean:     56.600 cm/s LVOT VTI:       0.161 m  AORTA Ao Root diam: 3.20 cm Ao Asc diam:  3.10 cm MITRAL VALVE MV Area (PHT): 3.77 cm    SHUNTS MV Decel Time: 201 msec    Systemic VTI:  0.16 m MV E velocity: 74.90 cm/s  Systemic Diam: 2.10 cm MV A velocity: 75.50 cm/s MV E/A ratio:  0.99 Marcina Millard MD Electronically signed by Marcina Millard MD Signature Date/Time: 05/21/2023/3:04:35 PM    Final    CARDIAC CATHETERIZATION  Result Date: 05/21/2023   Prox RCA lesion is 100% stenosed.   Prox LAD lesion is 90% stenosed.   Prox LAD to Mid LAD lesion is 30% stenosed.   A drug-eluting stent was successfully placed using a STENT ONYX FRONTIER 3.0X15.   Post intervention, there is a 0% residual stenosis.   There is mild to moderate left ventricular systolic dysfunction.   The left ventricular ejection fraction is 35-45% by visual estimate. 1.  Anterior STEMI 2.  Two-vessel coronary artery disease with 90% thrombotic stenosis proximal LAD (culprit lesion), and chronic 100% stenosis proximal RCA with right to right and left-to-right collaterals 3.  Mild to moderate reduced left ventricular function with estimated LV ejection fraction 35-40% 4.  Successful primary PCI with a 0.0 x 15 mm Onyx frontier DES proximal LAD  Recommendations 1.  Dual antiplatelet therapy uninterrupted x 1 year 2.  Start high intensity atorvastatin 80 mg daily 3.  Start metoprolol succinate 25 mg daily 4.  Start low-dose lisinopril 2.5 mg daily 5.  2D echocardiogram   DG Chest Port 1 View  Result Date: 05/21/2023 CLINICAL DATA:  Chest pain EXAM: PORTABLE CHEST - 1 VIEW COMPARISON:  05/13/2023 FINDINGS: Lungs are clear. Heart size and mediastinal contours are within normal limits. No effusion. Visualized bones unremarkable. IMPRESSION: No acute cardiopulmonary disease. Electronically Signed   By: Corlis Leak M.D.   On: 05/21/2023 09:12     Echo LVEF 40-45%  TELEMETRY: Sinus rhythm:  ASSESSMENT AND PLAN:  Principal Problem:   Acute ST elevation myocardial infarction (STEMI) involving left anterior descending (LAD) coronary artery (  HCC) Active Problems:   STEMI (ST elevation myocardial infarction) (HCC)   Tobacco abuse counseling   Ischemic cardiomyopathy    1. Anterior STEMI, with successful primary PCI, 3.0 x 15 mm Onyx Frontier drug-eluting stent proximal LAD, with chronically occluded mid RCA with right to right left-to-right collaterals 2.  Mild ischemic cardiomyopathy, LVEF 40-45% 05/21/2023 3.  Tobacco abuse   Recommendations   1.  Dual antiplatelet therapy uninterrupted x 1 year 2.  Continue high intensity atorvastatin 80 mg daily 3.  Continue metoprolol succinate 25 mg daily 4.  Continue low-dose lisinopril 2.5 mg daily 5.  Strongly advised patient to stop smoking 6.  May transfer to telemetry 7.  Likely DC home in a.m. 8.  Enroll in cardiac rehabilitation following discharge 9.  Follow-up with me 1 to 2 weeks     Marcina Millard, MD, PhD, [facility] 05/22/2023 8:58 AM

## 2023-05-23 ENCOUNTER — Encounter: Payer: Self-pay | Admitting: Cardiology

## 2023-05-23 ENCOUNTER — Other Ambulatory Visit: Payer: Self-pay

## 2023-05-23 ENCOUNTER — Other Ambulatory Visit (HOSPITAL_COMMUNITY): Payer: Self-pay

## 2023-05-23 DIAGNOSIS — I213 ST elevation (STEMI) myocardial infarction of unspecified site: Secondary | ICD-10-CM

## 2023-05-23 DIAGNOSIS — Z955 Presence of coronary angioplasty implant and graft: Secondary | ICD-10-CM

## 2023-05-23 LAB — POTASSIUM: Potassium: 3.6 mmol/L (ref 3.5–5.1)

## 2023-05-23 LAB — POCT ACTIVATED CLOTTING TIME: Activated Clotting Time: 421 s

## 2023-05-23 LAB — MAGNESIUM: Magnesium: 1.8 mg/dL (ref 1.7–2.4)

## 2023-05-23 MED ORDER — SPIRONOLACTONE 12.5 MG HALF TABLET
12.5000 mg | ORAL_TABLET | Freq: Every day | ORAL | Status: DC
  Administered 2023-05-23: 12.5 mg via ORAL
  Filled 2023-05-23: qty 1

## 2023-05-23 MED ORDER — ASPIRIN 81 MG PO TBEC
81.0000 mg | DELAYED_RELEASE_TABLET | Freq: Every day | ORAL | 0 refills | Status: DC
Start: 2023-05-23 — End: 2023-06-14
  Filled 2023-05-23: qty 60, 60d supply, fill #0

## 2023-05-23 MED ORDER — PRASUGREL HCL 10 MG PO TABS
10.0000 mg | ORAL_TABLET | Freq: Every day | ORAL | 0 refills | Status: DC
  Filled 2023-05-23: qty 30, 30d supply, fill #0

## 2023-05-23 MED ORDER — SPIRONOLACTONE 25 MG PO TABS
12.5000 mg | ORAL_TABLET | Freq: Every day | ORAL | 0 refills | Status: DC
  Filled 2023-05-23: qty 30, 60d supply, fill #0

## 2023-05-23 MED ORDER — NICOTINE 21 MG/24HR TD PT24: 21.0000 mg | MEDICATED_PATCH | Freq: Every day | TRANSDERMAL | Status: AC

## 2023-05-23 MED ORDER — METOPROLOL SUCCINATE ER 25 MG PO TB24
25.0000 mg | ORAL_TABLET | Freq: Every day | ORAL | 0 refills | Status: DC
  Filled 2023-05-23: qty 30, 30d supply, fill #0

## 2023-05-23 MED ORDER — POTASSIUM CHLORIDE CRYS ER 20 MEQ PO TBCR
40.0000 meq | EXTENDED_RELEASE_TABLET | Freq: Once | ORAL | Status: AC
  Administered 2023-05-23: 40 meq via ORAL
  Filled 2023-05-23: qty 2

## 2023-05-23 MED ORDER — ATORVASTATIN CALCIUM 80 MG PO TABS
80.0000 mg | ORAL_TABLET | Freq: Every day | ORAL | 0 refills | Status: DC
  Filled 2023-05-23: qty 30, 30d supply, fill #0

## 2023-05-23 MED ORDER — LISINOPRIL 2.5 MG PO TABS
2.5000 mg | ORAL_TABLET | Freq: Every day | ORAL | 0 refills | Status: DC
  Filled 2023-05-23: qty 30, 30d supply, fill #0

## 2023-05-23 NOTE — TOC Initial Note (Addendum)
Transition of Care (TOC) - Initial/Assessment Note    Patient Details  Name: Joshua Leblanc. MRN: [id number] Date of Birth: [birth year]  Transition of Care [facility]) CM/SW Contact:    Truddie Hidden, RN Phone Number: 05/23/2023, 11:38 AM  Clinical Narrative:                 Spoke with patient regarding discharge plans. Patient is uninsured. He switched jobs last week and states his insurance with his new job is inactive for the next 90 days. He is agreeable to [facility] application. Per Pharmacy patient can get his medications via medication management. His famiy will transport him home at discharge.           Patient Goals and CMS Choice            Expected Discharge Plan and Services         Expected Discharge Date: 05/23/23                                    Prior Living Arrangements/Services                       Activities of Daily Living   ADL Screening (condition at time of admission) Independently performs ADLs?: Yes (appropriate for developmental age) Is the patient deaf or have difficulty hearing?: No Does the patient have difficulty seeing, even when wearing glasses/contacts?: No Does the patient have difficulty concentrating, remembering, or making decisions?: No  Permission Sought/Granted                  Emotional Assessment              Admission diagnosis:  ST elevation myocardial infarction (STEMI), unspecified artery (HCC) [I21.3] Acute ST elevation myocardial infarction (STEMI) involving left anterior descending (LAD) coronary artery (HCC) [I21.02] STEMI (ST elevation myocardial infarction) [facility]) [I21.3] Patient Active Problem List   Diagnosis Date Noted   Hypokalemia 05/22/2023   Acute ST elevation myocardial infarction (STEMI) involving left anterior descending (LAD) coronary artery (HCC) 05/21/2023   STEMI (ST elevation myocardial infarction) (HCC) 05/21/2023   Tobacco abuse counseling 05/21/2023   Ischemic  cardiomyopathy 05/21/2023   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 STORE #[id number] [place], [place] - [address] AT [facility] OF [address] & [facility] [facility] [address] [place] [address] Phone: [phone] Fax: [phone]  [facility] DRUG STORE #[id number] [place], [place] - [address] AT [facility] OF [address] [place] [address] Phone: [phone] Fax: [phone]  [facility] [address] [place] [address] Phone: [phone] Fax: [phone]     Social Determinants of Health (SDOH) Social History: SDOH Screenings   Food Insecurity: No Food Insecurity (05/21/2023)  Housing: Low Risk  (05/21/2023)  Transportation Needs: No Transportation Needs (05/21/2023)  Utilities: Not At Risk (05/21/2023)  Financial Resource Strain: Low Risk  (09/03/2021)   Received from [facility]  Physical Activity: Insufficiently Active (09/03/2021)   Received from [facility]  Social Connections: Unknown (07/22/2020)   Received from [facility]  Tobacco Use: High Risk (05/13/2023)   SDOH Interventions:     Readmission Risk Interventions     No data to display

## 2023-05-23 NOTE — Plan of Care (Signed)
Problem: Education: Goal: Knowledge of General Education information will improve Description: Including pain rating scale, medication(s)/side effects and non-pharmacologic comfort measures Outcome: Progressing   Problem: Health Behavior/Discharge Planning: Goal: Ability to manage health-related needs will improve Outcome: Progressing   Problem: Clinical Measurements: Goal: Ability to maintain clinical measurements within normal limits will improve Outcome: Progressing Goal: Will remain free from infection Outcome: Progressing Goal: Diagnostic test results will improve Outcome: Progressing Goal: Respiratory complications will improve Outcome: Progressing Goal: Cardiovascular complication will be avoided Outcome: Progressing   Problem: Activity: Goal: Risk for activity intolerance will decrease Outcome: Progressing   Problem: Nutrition: Goal: Adequate nutrition will be maintained Outcome: Progressing   Problem: Coping: Goal: Level of anxiety will decrease Outcome: Progressing   Problem: Elimination: Goal: Will not experience complications related to bowel motility Outcome: Progressing Goal: Will not experience complications related to urinary retention Outcome: Progressing   Problem: Pain Managment: Goal: General experience of comfort will improve Outcome: Progressing   Problem: Safety: Goal: Ability to remain free from injury will improve Outcome: Progressing   Problem: Skin Integrity: Goal: Risk for impaired skin integrity will decrease Outcome: Progressing   Problem: Education: Goal: Understanding of CV disease, CV risk reduction, and recovery process will improve Outcome: Progressing Goal: Individualized Educational Video(s) Outcome: Progressing   Problem: Activity: Goal: Ability to return to baseline activity level will improve Outcome: Progressing   Problem: Cardiovascular: Goal: Ability to achieve and maintain adequate cardiovascular perfusion  will improve Outcome: Progressing Goal: Vascular access site(s) Level 0-1 will be maintained Outcome: Progressing   Problem: Health Behavior/Discharge Planning: Goal: Ability to safely manage health-related needs after discharge will improve Outcome: Progressing   Problem: Education: Goal: Understanding of cardiac disease, CV risk reduction, and recovery process will improve Outcome: Progressing Goal: Individualized Educational Video(s) Outcome: Progressing   Problem: Activity: Goal: Ability to tolerate increased activity will improve Outcome: Progressing   Problem: Cardiac: Goal: Ability to achieve and maintain adequate cardiovascular perfusion will improve Outcome: Progressing   Problem: Health Behavior/Discharge Planning: Goal: Ability to safely manage health-related needs after discharge will improve Outcome: Progressing

## 2023-05-23 NOTE — Progress Notes (Signed)
[facility] Cardiology Cardiology Progress Note  SUBJECTIVE: Patient laying in bed with wife and daughter present. Reports feeling better overall. Denies any chest pain or shortness of breath. Has been OOB which he has tolerated well.   Vitals:   05/22/23 2005 05/22/23 2343 05/23/23 0448 05/23/23 0916  BP: 116/75 111/73 118/81 (!) 128/91  Pulse: 75 64 62 76  Resp: 18 18 18 20   Temp: 98.5 F (36.9 C) 97.7 F (36.5 C) 98 F (36.7 C) 99 F (37.2 C)  TempSrc:  Oral Oral Oral  SpO2: 97% 96% 95% 95%  Weight:      Height:         Intake/Output Summary (Last 24 hours) at 05/23/2023 1032 Last data filed at 05/22/2023 1923 Gross per 24 hour  Intake 290 ml  Output --  Net 290 ml      PHYSICAL EXAM General: Well developed, well nourished, in no acute distress sitting upright in hospital bed. HEENT:  Normocephalic and atramatic Neck:  No JVD.  Lungs: Clear bilaterally to auscultation and percussion. Heart: HRRR . Normal S1 and S2 without gallops or murmurs.  Abdomen: Bowel sounds are positive, abdomen soft and non-tender  Msk:  Back normal, normal gait. Normal strength and tone for age. Extremities: No clubbing, cyanosis or edema.   Neuro: Alert and oriented X 3. Psych:  Good affect, responds appropriately   LABS: Basic Metabolic Panel: Recent Labs    05/21/23 1509 05/22/23 0340 05/23/23 0524  NA 136 139  --   K 3.4* 3.1* 3.6  CL 103 104  --   CO2 25 26  --   GLUCOSE 114* 112*  --   BUN 7 8  --   CREATININE 1.00 0.94  --   CALCIUM 8.1* 8.1*  --   MG 1.9  --  1.8   Liver Function Tests: Recent Labs    05/21/23 0841 05/21/23 1509  AST 38 55*  ALT 18 19  ALKPHOS 56 49  BILITOT 0.8 0.8  PROT 7.1 5.9*  ALBUMIN 3.5 2.9*   No results for input(s): "LIPASE", "AMYLASE" in the last 72 hours. CBC: Recent Labs    05/21/23 0841 05/22/23 0340  WBC 11.1* 12.3*  NEUTROABS 7.2  --   HGB 16.7 14.6  HCT 49.2 42.9  MCV 103.6* 101.7*  PLT 359 317   Cardiac Enzymes: No  results for input(s): "CKTOTAL", "CKMB", "CKMBINDEX", "TROPONINI" in the last 72 hours. BNP: Invalid input(s): "POCBNP" D-Dimer: No results for input(s): "DDIMER" in the last 72 hours. Hemoglobin A1C: Recent Labs    05/21/23 0841  HGBA1C 5.4   Fasting Lipid Panel: Recent Labs    05/21/23 0841  CHOL 154  HDL 32*  LDLCALC 94  TRIG 161  CHOLHDL 4.8   Thyroid Function Tests: No results for input(s): "TSH", "T4TOTAL", "T3FREE", "THYROIDAB" in the last 72 hours.  Invalid input(s): "FREET3" Anemia Panel: No results for input(s): "VITAMINB12", "FOLATE", "FERRITIN", "TIBC", "IRON", "RETICCTPCT" in the last 72 hours.  ECHOCARDIOGRAM COMPLETE  Result Date: 05/21/2023    ECHOCARDIOGRAM REPORT   Patient Name:   Joshua Leblanc. Date of Exam: 05/21/2023 Medical Rec #:  [id number]               Height:       71.0 in Accession #:    [id number]              Weight:       179.5 lb Date of Birth:  May 09, 1976  BSA:          2.014 m Patient Age:    47 years                BP:           123/85 mmHg Patient Gender: M                       HR:           77 bpm. Exam Location:  [facility] Procedure: 2D Echo Indications:     AMI I21.9  History:         Patient has no prior history of Echocardiogram examinations.  Sonographer:     Overton Mam RDCS, FASE Referring Phys:  [id number] Lyn Hollingshead PARASCHOS Diagnosing Phys: Marcina Millard MD IMPRESSIONS  1. Left ventricular ejection fraction, by estimation, is 40 to 45%. The left ventricle has mildly decreased function. The left ventricle demonstrates regional wall motion abnormalities (see scoring diagram/findings for description). Left ventricular diastolic parameters were normal.  2. Right ventricular systolic function is normal. The right ventricular size is normal.  3. The mitral valve is normal in structure. Mild mitral valve regurgitation. No evidence of mitral stenosis.  4. The aortic valve is normal in structure. Aortic valve regurgitation  is not visualized. No aortic stenosis is present.  5. The inferior vena cava is normal in size with greater than 50% respiratory variability, suggesting right atrial pressure of 3 mmHg. FINDINGS  Left Ventricle: Left ventricular ejection fraction, by estimation, is 40 to 45%. The left ventricle has mildly decreased function. The left ventricle demonstrates regional wall motion abnormalities. The left ventricular internal cavity size was normal in size. There is no left ventricular hypertrophy. Left ventricular diastolic parameters were normal.  LV Wall Scoring: The apical lateral segment, apical septal segment, and apex are hypokinetic. Right Ventricle: The right ventricular size is normal. No increase in right ventricular wall thickness. Right ventricular systolic function is normal. Left Atrium: Left atrial size was normal in size. Right Atrium: Right atrial size was normal in size. Pericardium: There is no evidence of pericardial effusion. Mitral Valve: The mitral valve is normal in structure. Mild mitral valve regurgitation. No evidence of mitral valve stenosis. Tricuspid Valve: The tricuspid valve is normal in structure. Tricuspid valve regurgitation is not demonstrated. No evidence of tricuspid stenosis. Aortic Valve: The aortic valve is normal in structure. Aortic valve regurgitation is not visualized. No aortic stenosis is present. Aortic valve peak gradient measures 5.5 mmHg. Pulmonic Valve: The pulmonic valve was normal in structure. Pulmonic valve regurgitation is not visualized. No evidence of pulmonic stenosis. Aorta: The aortic root is normal in size and structure. Venous: The inferior vena cava is normal in size with greater than 50% respiratory variability, suggesting right atrial pressure of 3 mmHg. IAS/Shunts: No atrial level shunt detected by color flow Doppler.  LEFT VENTRICLE PLAX 2D LVIDd:         5.40 cm      Diastology LVIDs:         4.10 cm      LV e' medial:    8.81 cm/s LV PW:         1.00  cm      LV E/e' medial:  8.5 LV IVS:        1.00 cm      LV e' lateral:   14.70 cm/s LVOT diam:     2.10 cm      LV  E/e' lateral: 5.1 LV SV:         56 LV SV Index:   28 LVOT Area:     3.46 cm  LV Volumes (MOD) LV vol d, MOD A2C: 99.6 ml LV vol d, MOD A4C: 118.0 ml LV vol s, MOD A2C: 55.3 ml LV vol s, MOD A4C: 66.2 ml LV SV MOD A2C:     44.3 ml LV SV MOD A4C:     118.0 ml LV SV MOD BP:      47.3 ml RIGHT VENTRICLE RV Basal diam:  2.50 cm RV S prime:     16.10 cm/s TAPSE (M-mode): 2.4 cm LEFT ATRIUM           Index        RIGHT ATRIUM          Index LA diam:      3.40 cm 1.69 cm/m   RA Area:     9.89 cm LA Vol (A2C): 29.3 ml 14.55 ml/m  RA Volume:   20.00 ml 9.93 ml/m LA Vol (A4C): 36.2 ml 17.98 ml/m  AORTIC VALVE                 PULMONIC VALVE AV Area (Vmax): 2.58 cm     PV Vmax:        0.90 m/s AV Vmax:        117.00 cm/s  PV Peak grad:   3.2 mmHg AV Peak Grad:   5.5 mmHg     RVOT Peak grad: 2 mmHg LVOT Vmax:      87.00 cm/s LVOT Vmean:     56.600 cm/s LVOT VTI:       0.161 m  AORTA Ao Root diam: 3.20 cm Ao Asc diam:  3.10 cm MITRAL VALVE MV Area (PHT): 3.77 cm    SHUNTS MV Decel Time: 201 msec    Systemic VTI:  0.16 m MV E velocity: 74.90 cm/s  Systemic Diam: 2.10 cm MV A velocity: 75.50 cm/s MV E/A ratio:  0.99 Marcina Millard MD Electronically signed by Marcina Millard MD Signature Date/Time: 05/21/2023/3:04:35 PM    Final      TELEMETRY (05/23/23): sinus rhythm rate 70s  Data reviewed (05/23/23): last 24h vitals tele labs imaging I/O hospitalist progress note  ASSESSMENT AND PLAN:  Principal Problem:   Acute ST elevation myocardial infarction (STEMI) involving left anterior descending (LAD) coronary artery (HCC) Active Problems:   STEMI (ST elevation myocardial infarction) (HCC)   Tobacco abuse counseling   Ischemic cardiomyopathy   Hypokalemia    1. Anterior STEMI, with successful primary PCI, 3.0 x 15 mm Onyx Frontier drug-eluting stent proximal LAD, with chronically occluded  mid RCA with right to right left-to-right collaterals 2.  Mild ischemic cardiomyopathy, LVEF 40-45% this admission 3.  Tobacco abuse 4.   Hypokalemia   Recommendations   1.  Dual antiplatelet therapy uninterrupted x 1 year 2.  Continue high intensity atorvastatin 80 mg daily 3.  Continue metoprolol succinate 25 mg daily 4.  Continue low-dose lisinopril 2.5 mg daily 5.  Start spironolactone 12.5 mg daily.  5.  Strongly advised patient to stop smoking 6.  Enroll in cardiac rehabilitation following discharge 7.  Follow-up with Dr. Darrold Junker 1 to 2 weeks   Ok for discharge today from a cardiac perspective. Will arrange for follow up in clinic with Dr. Darrold Junker in 1-2 weeks.    SignedGale Journey, PA-C  05/23/2023 10:32 AM

## 2023-05-23 NOTE — Discharge Summary (Addendum)
Physician Discharge Summary   Patient: Joshua Leblanc. MRN: [id number] DOB: 1975-09-03  Admit date:     05/21/2023  Discharge date: 05/23/23  Discharge Physician: Lurene Shadow   PCP: [facility]   Recommendations at discharge:   Follow-up with Dr. Darrold Junker, cardiologist, in 1 week Follow-up with PCP in 2 weeks  Discharge Diagnoses: Principal Problem:   Acute ST elevation myocardial infarction (STEMI) involving left anterior descending (LAD) coronary artery (HCC) Active Problems:   STEMI (ST elevation myocardial infarction) (HCC)   Ischemic cardiomyopathy   Tobacco abuse counseling   Hypokalemia  Resolved Problems:   * No resolved hospital problems. *  Hospital Course:  [name]. is a 47 y.o. male with medical history significant for depression, anxiety, neck pain, headache, tobacco use disorder, who presented to the hospital with chest pain.   He was found to have acute ST elevation MI.  He underwent emergent left heart catheterization.   Assessment and Plan:  Acute ST elevation MI: S/p left heart cath which showed 90% stenosis proximal LAD with chronically occluded mid RCA with collaterals.  Drug-eluting stent was placed to proximal LAD.  Continue metoprolol, lisinopril, high-dose Lipitor, aspirin and prasugrel.  Aldactone has been ordered as well. Lipid panel showed cholesterol 154, triglycerides 142, HDL 32 and LDL 94. Hemoglobin A1c was 5.4.     Ischemic cardiomyopathy: Continue metoprolol, spironolactone and lisinopril.  2D echo showed EF estimated at 40 to 45%.     Nonsustained ventricular tachycardia: Continue low-dose metoprolol.     Hypokalemia: Repleted potassium and magnesium.       Tobacco use disorder: Patient has been advised to stop smoking cigarettes.   Left thigh numbness: This has been going on intermittently for about 2 months.  Recommended outpatient follow-up with PCP for further evaluation.   His condition  has improved and  he is deemed stable for discharge to home today.  Discharge plan was discussed with his girlfriend, sister and daughter at the bedside.  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  For this reason, prescriptions were sent to the hospital pharmacy to be dispensed at no cost.  He has also been advised to obtain medication refills at least 1 week prior to running out of his medicines.      Consultants: Cardiologist Procedures performed: Left heart cath with drug-eluting stent to proximal LAD Disposition: Home Diet recommendation:  Discharge Diet Orders (From admission, onward)     Start     Ordered   05/23/23 0000  Diet - low sodium heart healthy        05/23/23 1104           Cardiac diet DISCHARGE MEDICATION: Allergies as of 05/23/2023       Reactions   Alprazolam Nausea Only   Black out and memory lost Black out and memory lost   Codeine Nausea Only        Medication List     STOP taking these medications    ibuprofen 800 MG tablet Commonly known as: ADVIL   predniSONE 10 MG (21) Tbpk tablet Commonly known as: STERAPRED UNI-PAK 21 TAB       TAKE these medications    aspirin EC 81 MG tablet Take 1 tablet (81 mg total) by mouth daily. Swallow whole.   atorvastatin 80 MG tablet Commonly known as: LIPITOR Take 1 tablet (80 mg total) by mouth daily. Start taking on: May 24, 2023   lisinopril  2.5 MG tablet Commonly known as: ZESTRIL Take 1 tablet (2.5 mg total) by mouth daily. Start taking on: May 24, 2023   metoprolol succinate 25 MG 24 hr tablet Commonly known as: TOPROL-XL Take 1 tablet (25 mg total) by mouth daily. Start taking on: May 24, 2023   nicotine 21 mg/24hr patch Commonly known as: NICODERM CQ - dosed in mg/24 hours Place 1 patch (21 mg total) onto the skin daily. Start taking on: May 24, 2023   prasugrel 10 MG Tabs tablet Commonly known as: EFFIENT Take 1 tablet (10 mg  total) by mouth daily. Start taking on: May 24, 2023   spironolactone 25 MG tablet Commonly known as: ALDACTONE Take 0.5 tablets (12.5 mg total) by mouth daily.        Follow-up Information     Paraschos, Alexander, MD. Go in 1 week(s).   Specialty: Cardiology Contact information: [address] [facility] [place] [address] [phone]                Discharge Exam: Ceasar Mons Weights   05/21/23 0845 05/21/23 1021 05/21/23 1200  Weight: 81.3 kg 81.4 kg 81 kg   GEN: NAD SKIN: No rash EYES: EOMI ENT: MMM CV: RRR PULM: CTA B ABD: soft, ND, NT, +BS CNS: AAO x 3, non focal EXT: No edema or tenderness   Condition at discharge: good  The results of significant diagnostics from this hospitalization (including imaging, microbiology, ancillary and laboratory) are listed below for reference.   Imaging Studies: ECHOCARDIOGRAM COMPLETE  Result Date: 05/21/2023    ECHOCARDIOGRAM REPORT   Patient Name:   Joshua Leblanc. Date of Exam: 05/21/2023 Medical Rec #:  [id number]               Height:       71.0 in Accession #:    [id number]              Weight:       179.5 lb Date of Birth:  [birth year]               BSA:          2.014 m Patient Age:    47 years                BP:           123/85 mmHg Patient Gender: M                       HR:           77 bpm. Exam Location:  [facility] Procedure: 2D Echo Indications:     AMI I21.9  History:         Patient has no prior history of Echocardiogram examinations.  Sonographer:     Overton Mam RDCS, FASE Referring Phys:  [id number] Lyn Hollingshead PARASCHOS Diagnosing Phys: Marcina Millard MD IMPRESSIONS  1. Left ventricular ejection fraction, by estimation, is 40 to 45%. The left ventricle has mildly decreased function. The left ventricle demonstrates regional wall motion abnormalities (see scoring diagram/findings for description). Left ventricular diastolic parameters were normal.  2. Right ventricular  systolic function is normal. The right ventricular size is normal.  3. The mitral valve is normal in structure. Mild mitral valve regurgitation. No evidence of mitral stenosis.  4. The aortic valve is normal in structure. Aortic valve regurgitation is not visualized. No aortic stenosis is present.  5. The inferior vena cava is normal  in size with greater than 50% respiratory variability, suggesting right atrial pressure of 3 mmHg. FINDINGS  Left Ventricle: Left ventricular ejection fraction, by estimation, is 40 to 45%. The left ventricle has mildly decreased function. The left ventricle demonstrates regional wall motion abnormalities. The left ventricular internal cavity size was normal in size. There is no left ventricular hypertrophy. Left ventricular diastolic parameters were normal.  LV Wall Scoring: The apical lateral segment, apical septal segment, and apex are hypokinetic. Right Ventricle: The right ventricular size is normal. No increase in right ventricular wall thickness. Right ventricular systolic function is normal. Left Atrium: Left atrial size was normal in size. Right Atrium: Right atrial size was normal in size. Pericardium: There is no evidence of pericardial effusion. Mitral Valve: The mitral valve is normal in structure. Mild mitral valve regurgitation. No evidence of mitral valve stenosis. Tricuspid Valve: The tricuspid valve is normal in structure. Tricuspid valve regurgitation is not demonstrated. No evidence of tricuspid stenosis. Aortic Valve: The aortic valve is normal in structure. Aortic valve regurgitation is not visualized. No aortic stenosis is present. Aortic valve peak gradient measures 5.5 mmHg. Pulmonic Valve: The pulmonic valve was normal in structure. Pulmonic valve regurgitation is not visualized. No evidence of pulmonic stenosis. Aorta: The aortic root is normal in size and structure. Venous: The inferior vena cava is normal in size with greater than 50% respiratory  variability, suggesting right atrial pressure of 3 mmHg. IAS/Shunts: No atrial level shunt detected by color flow Doppler.  LEFT VENTRICLE PLAX 2D LVIDd:         5.40 cm      Diastology LVIDs:         4.10 cm      LV e' medial:    8.81 cm/s LV PW:         1.00 cm      LV E/e' medial:  8.5 LV IVS:        1.00 cm      LV e' lateral:   14.70 cm/s LVOT diam:     2.10 cm      LV E/e' lateral: 5.1 LV SV:         56 LV SV Index:   28 LVOT Area:     3.46 cm  LV Volumes (MOD) LV vol d, MOD A2C: 99.6 ml LV vol d, MOD A4C: 118.0 ml LV vol s, MOD A2C: 55.3 ml LV vol s, MOD A4C: 66.2 ml LV SV MOD A2C:     44.3 ml LV SV MOD A4C:     118.0 ml LV SV MOD BP:      47.3 ml RIGHT VENTRICLE RV Basal diam:  2.50 cm RV S prime:     16.10 cm/s TAPSE (M-mode): 2.4 cm LEFT ATRIUM           Index        RIGHT ATRIUM          Index LA diam:      3.40 cm 1.69 cm/m   RA Area:     9.89 cm LA Vol (A2C): 29.3 ml 14.55 ml/m  RA Volume:   20.00 ml 9.93 ml/m LA Vol (A4C): 36.2 ml 17.98 ml/m  AORTIC VALVE                 PULMONIC VALVE AV Area (Vmax): 2.58 cm     PV Vmax:        0.90 m/s AV Vmax:  117.00 cm/s  PV Peak grad:   3.2 mmHg AV Peak Grad:   5.5 mmHg     RVOT Peak grad: 2 mmHg LVOT Vmax:      87.00 cm/s LVOT Vmean:     56.600 cm/s LVOT VTI:       0.161 m  AORTA Ao Root diam: 3.20 cm Ao Asc diam:  3.10 cm MITRAL VALVE MV Area (PHT): 3.77 cm    SHUNTS MV Decel Time: 201 msec    Systemic VTI:  0.16 m MV E velocity: 74.90 cm/s  Systemic Diam: 2.10 cm MV A velocity: 75.50 cm/s MV E/A ratio:  0.99 Marcina Millard MD Electronically signed by Marcina Millard MD Signature Date/Time: 05/21/2023/3:04:35 PM    Final    CARDIAC CATHETERIZATION  Result Date: 05/21/2023   Prox RCA lesion is 100% stenosed.   Prox LAD lesion is 90% stenosed.   Prox LAD to Mid LAD lesion is 30% stenosed.   A drug-eluting stent was successfully placed using a STENT ONYX FRONTIER 3.0X15.   Post intervention, there is a 0% residual stenosis.   There is  mild to moderate left ventricular systolic dysfunction.   The left ventricular ejection fraction is 35-45% by visual estimate. 1.  Anterior STEMI 2.  Two-vessel coronary artery disease with 90% thrombotic stenosis proximal LAD (culprit lesion), and chronic 100% stenosis proximal RCA with right to right and left-to-right collaterals 3.  Mild to moderate reduced left ventricular function with estimated LV ejection fraction 35-40% 4.  Successful primary PCI with a 0.0 x 15 mm Onyx frontier DES proximal LAD Recommendations 1.  Dual antiplatelet therapy uninterrupted x 1 year 2.  Start high intensity atorvastatin 80 mg daily 3.  Start metoprolol succinate 25 mg daily 4.  Start low-dose lisinopril 2.5 mg daily 5.  2D echocardiogram   DG Chest Port 1 View  Result Date: 05/21/2023 CLINICAL DATA:  Chest pain EXAM: PORTABLE CHEST - 1 VIEW COMPARISON:  05/13/2023 FINDINGS: Lungs are clear. Heart size and mediastinal contours are within normal limits. No effusion. Visualized bones unremarkable. IMPRESSION: No acute cardiopulmonary disease. Electronically Signed   By: Corlis Leak M.D.   On: 05/21/2023 09:12   DG Chest 2 View  Result Date: 05/13/2023 CLINICAL DATA:  Productive cough and fever. EXAM: CHEST - 2 VIEW COMPARISON:  10/01/2010 FINDINGS: The heart size and mediastinal contours are within normal limits. Both lungs are clear. The visualized skeletal structures are unremarkable. IMPRESSION: No active cardiopulmonary disease. Electronically Signed   By: Danae Orleans M.D.   On: 05/13/2023 10:18    Microbiology: Results for orders placed or performed during the hospital encounter of 05/21/23  MRSA Next Gen by PCR, Nasal     Status: None   Collection Time: 05/21/23 10:48 AM   Specimen: Nasal Mucosa; Nasal Swab  Result Value Ref Range Status   MRSA by PCR Next Gen NOT DETECTED NOT DETECTED Final    Comment: (NOTE) The GeneXpert MRSA Assay (FDA approved for NASAL specimens only), is one component of a  comprehensive MRSA colonization surveillance program. It is not intended to diagnose MRSA infection nor to guide or monitor treatment for MRSA infections. Test performance is not FDA approved in patients less than 27 years old. Performed at [facility], [address] [place], [place] [address]     Labs: CBC: Recent Labs  Lab 05/21/23 0841 05/22/23 0340  WBC 11.1* 12.3*  NEUTROABS 7.2  --   HGB 16.7 14.6  HCT 49.2 42.9  MCV 103.6* 101.7*  PLT 359 317   Basic Metabolic Panel: Recent Labs  Lab 05/21/23 0841 05/21/23 1509 05/22/23 0340 05/23/23 0524  NA 138 136 139  --   K 3.1* 3.4* 3.1* 3.6  CL 100 103 104  --   CO2 28 25 26   --   GLUCOSE 123* 114* 112*  --   BUN 5* 7 8  --   CREATININE 1.03 1.00 0.94  --   CALCIUM 8.4* 8.1* 8.1*  --   MG  --  1.9  --  1.8   Liver Function Tests: Recent Labs  Lab 05/21/23 0841 05/21/23 1509  AST 38 55*  ALT 18 19  ALKPHOS 56 49  BILITOT 0.8 0.8  PROT 7.1 5.9*  ALBUMIN 3.5 2.9*   CBG: Recent Labs  Lab 05/21/23 1056  GLUCAP 98    Discharge time spent: greater than 30 minutes.  Signed: Lurene Shadow, MD [facility] 05/23/2023

## 2023-05-24 LAB — LIPOPROTEIN A (LPA): Lipoprotein (a): 64 nmol/L — ABNORMAL HIGH (ref <75.0)

## 2023-06-01 ENCOUNTER — Other Ambulatory Visit: Payer: Self-pay

## 2023-06-01 DIAGNOSIS — Z720 Tobacco use: Secondary | ICD-10-CM | POA: Insufficient documentation

## 2023-06-01 MED ORDER — METOPROLOL SUCCINATE ER 25 MG PO TB24
25.0000 mg | ORAL_TABLET | Freq: Every day | ORAL | 5 refills | Status: AC
  Filled 2023-06-01: qty 30, 30d supply, fill #0

## 2023-06-01 MED ORDER — LISINOPRIL 2.5 MG PO TABS
2.5000 mg | ORAL_TABLET | Freq: Every day | ORAL | 5 refills | Status: AC
  Filled 2023-06-01: qty 30, 30d supply, fill #0

## 2023-06-01 MED ORDER — PRASUGREL HCL 10 MG PO TABS
10.0000 mg | ORAL_TABLET | Freq: Every day | ORAL | 5 refills | Status: AC
  Filled 2023-06-01: qty 30, 30d supply, fill #0

## 2023-06-01 MED ORDER — ATORVASTATIN CALCIUM 80 MG PO TABS
80.0000 mg | ORAL_TABLET | Freq: Every day | ORAL | 5 refills | Status: AC
  Filled 2023-06-01: qty 30, 30d supply, fill #0

## 2023-06-03 NOTE — Plan of Care (Signed)
 [facility] Tonsillectomy/Adenoidectomy, Postoperative PEDS care plan entered in error.

## 2023-06-08 ENCOUNTER — Encounter: Payer: BC Managed Care – PPO | Attending: Cardiology | Admitting: *Deleted

## 2023-06-08 DIAGNOSIS — I213 ST elevation (STEMI) myocardial infarction of unspecified site: Secondary | ICD-10-CM

## 2023-06-08 DIAGNOSIS — Z955 Presence of coronary angioplasty implant and graft: Secondary | ICD-10-CM

## 2023-06-08 NOTE — Progress Notes (Signed)
Initial phone call completed. Diagnosis can be found in [facility] 10/12. EP Orientation scheduled for Monday 11/4 1:30.  Cristain is a current tobacco user. Intervention for tobacco cessation was provided at the initial medical review. He was asked about readiness to quit and reported that he is ready to start trying. He has reduced his smoking from 2 packs to 5 cigs a day . Patient was advised and educated about tobacco cessation using combination therapy, tobacco cessation classes, quit line, and quit smoking apps. Patient demonstrated understanding of this material. Staff will continue to provide encouragement and follow up with the patient throughout the program.

## 2023-06-13 ENCOUNTER — Other Ambulatory Visit: Payer: Self-pay

## 2023-06-13 ENCOUNTER — Encounter: Payer: BC Managed Care – PPO | Attending: Cardiology | Admitting: *Deleted

## 2023-06-13 VITALS — Ht 70.0 in | Wt 179.2 lb

## 2023-06-13 DIAGNOSIS — Z955 Presence of coronary angioplasty implant and graft: Secondary | ICD-10-CM | POA: Diagnosis present

## 2023-06-13 DIAGNOSIS — Z48812 Encounter for surgical aftercare following surgery on the circulatory system: Secondary | ICD-10-CM | POA: Insufficient documentation

## 2023-06-13 DIAGNOSIS — I252 Old myocardial infarction: Secondary | ICD-10-CM | POA: Insufficient documentation

## 2023-06-13 DIAGNOSIS — I213 ST elevation (STEMI) myocardial infarction of unspecified site: Secondary | ICD-10-CM | POA: Diagnosis present

## 2023-06-13 MED ORDER — LISINOPRIL 2.5 MG PO TABS: 2.5000 mg | ORAL_TABLET | Freq: Every day | ORAL | 5 refills | Status: AC

## 2023-06-13 MED ORDER — METOPROLOL SUCCINATE ER 25 MG PO TB24: 25.0000 mg | ORAL_TABLET | Freq: Every day | ORAL | 5 refills | Status: AC

## 2023-06-13 MED ORDER — ATORVASTATIN CALCIUM 80 MG PO TABS: 80.0000 mg | ORAL_TABLET | Freq: Every day | ORAL | 5 refills | Status: AC

## 2023-06-13 MED ORDER — PRASUGREL HCL 10 MG PO TABS: 10.0000 mg | ORAL_TABLET | Freq: Every day | ORAL | 5 refills | Status: AC

## 2023-06-13 NOTE — Patient Instructions (Signed)
Patient Instructions  Patient Details  Name: Joshua Leblanc. MRN: [id number] Date of Birth: May 28, 1976 Referring Provider:  Marcina Millard, MD  Below are your personal goals for exercise, nutrition, and risk factors. Our goal is to help you stay on track towards obtaining and maintaining these goals. We will be discussing your progress on these goals with you throughout the program.  Initial Exercise Prescription:  Initial Exercise Prescription - 06/13/23 1500       Date of Initial Exercise RX and Referring Provider   Date 06/13/23    Referring Provider Rita Ohara      Oxygen   Maintain Oxygen Saturation 88% or higher      Treadmill   MPH 2.7    Grade 1    Minutes 15    METs 4.3      NuStep   Level 3    SPM 80    Minutes 15    METs 4.3      Elliptical   Level 1    Speed 3    Minutes 15    METs 4.3      REL-XR   Level 3    Speed 50    Minutes 15    METs 4.3      T5 Nustep   Level 3    SPM 80    Minutes 15    METs 4.3      Prescription Details   Frequency (times per week) 3    Duration Progress to 30 minutes of continuous aerobic without signs/symptoms of physical distress      Intensity   THRR 40-80% of Max Heartrate 111-153    Ratings of Perceived Exertion 11-13    Perceived Dyspnea 0-4      Progression   Progression Continue to progress workloads to maintain intensity without signs/symptoms of physical distress.      Resistance Training   Training Prescription Yes    Weight 4    Reps 10-15             Exercise Goals: Frequency: Be able to perform aerobic exercise two to three times per week in program working toward 2-5 days per week of home exercise.  Intensity: Work with a perceived exertion of 11 (fairly light) - 15 (hard) while following your exercise prescription.  We will make changes to your prescription with you as you progress through the program.   Duration: Be able to do 30 to 45 minutes of continuous  aerobic exercise in addition to a 5 minute warm-up and a 5 minute cool-down routine.   Nutrition Goals: Your personal nutrition goals will be established when you do your nutrition analysis with the dietician.  The following are general nutrition guidelines to follow: Cholesterol < 200mg /day Sodium < 1500mg /day Fiber: Men under 50 yrs - 38 grams per day  Personal Goals:  Personal Goals and Risk Factors at Admission - 06/13/23 1526       Core Components/Risk Factors/Patient Goals on Admission    Weight Management Yes    Intervention Weight Management: Develop a combined nutrition and exercise program designed to reach desired caloric intake, while maintaining appropriate intake of nutrient and fiber, sodium and fats, and appropriate energy expenditure required for the weight goal.;Weight Management: Provide education and appropriate resources to help participant work on and attain dietary goals.;Weight Management/Obesity: Establish reasonable short term and long term weight goals.    Admit Weight 179 lb 3.2 oz (81.3 kg)    Goal Weight:  Short Term 179 lb (81.2 kg)    Goal Weight: Long Term 179 lb (81.2 kg)    Expected Outcomes Short Term: Continue to assess and modify interventions until short term weight is achieved;Long Term: Adherence to nutrition and physical activity/exercise program aimed toward attainment of established weight goal;Weight Maintenance: Understanding of the daily nutrition guidelines, which includes 25-35% calories from fat, 7% or less cal from saturated fats, less than 200mg  cholesterol, less than 1.5gm of sodium, & 5 or more servings of fruits and vegetables daily;Understanding recommendations for meals to include 15-35% energy as protein, 25-35% energy from fat, 35-60% energy from carbohydrates, less than 200mg  of dietary cholesterol, 20-35 gm of total fiber daily;Understanding of distribution of calorie intake throughout the day with the consumption of 4-5 meals/snacks     Tobacco Cessation Yes    Number of packs per day 1/2    Intervention Assist the participant in steps to quit. Provide individualized education and counseling about committing to Tobacco Cessation, relapse prevention, and pharmacological support that can be provided by physician.;Education officer, environmental, assist with locating and accessing local/national Quit Smoking programs, and support quit date choice.    Expected Outcomes Short Term: Will demonstrate readiness to quit, by selecting a quit date.;Short Term: Will quit all tobacco product use, adhering to prevention of relapse plan.;Long Term: Complete abstinence from all tobacco products for at least 12 months from quit date.    Intervention Provide education and support for participant on nutrition & aerobic/resistive exercise along with prescribed medications to achieve LDL 70mg , HDL >40mg .    Expected Outcomes Short Term: Participant states understanding of desired cholesterol values and is compliant with medications prescribed. Participant is following exercise prescription and nutrition guidelines.;Long Term: Cholesterol controlled with medications as prescribed, with individualized exercise RX and with personalized nutrition plan. Value goals: LDL < 70mg , HDL > 40 mg.             Tobacco Use Initial Evaluation: Social History   Tobacco Use  Smoking Status Every Day   Current packs/day: 1.00   Average packs/day: 1 pack/day for 15.0 years (15.0 ttl pk-yrs)   Types: Cigarettes  Smokeless Tobacco Never    Exercise Goals and Review:  Exercise Goals     Row Name 06/13/23 1531             Exercise Goals   Increase Physical Activity Yes       Intervention Provide advice, education, support and counseling about physical activity/exercise needs.;Develop an individualized exercise prescription for aerobic and resistive training based on initial evaluation findings, risk stratification, comorbidities and participant's personal  goals.       Expected Outcomes Short Term: Attend rehab on a regular basis to increase amount of physical activity.;Long Term: Add in home exercise to make exercise part of routine and to increase amount of physical activity.;Long Term: Exercising regularly at least 3-5 days a week.       Increase Strength and Stamina Yes       Intervention Provide advice, education, support and counseling about physical activity/exercise needs.;Develop an individualized exercise prescription for aerobic and resistive training based on initial evaluation findings, risk stratification, comorbidities and participant's personal goals.       Expected Outcomes Short Term: Increase workloads from initial exercise prescription for resistance, speed, and METs.;Short Term: Perform resistance training exercises routinely during rehab and add in resistance training at home;Long Term: Improve cardiorespiratory fitness, muscular endurance and strength as measured by increased METs and functional capacity ( )  Able to understand and use rate of perceived exertion (RPE) scale Yes       Intervention Provide education and explanation on how to use RPE scale       Expected Outcomes Short Term: Able to use RPE daily in rehab to express subjective intensity level;Long Term:  Able to use RPE to guide intensity level when exercising independently       Able to understand and use Dyspnea scale Yes       Intervention Provide education and explanation on how to use Dyspnea scale       Expected Outcomes Short Term: Able to use Dyspnea scale daily in rehab to express subjective sense of shortness of breath during exertion;Long Term: Able to use Dyspnea scale to guide intensity level when exercising independently       Knowledge and understanding of Target Heart Rate Range (THRR) Yes       Intervention Provide education and explanation of THRR including how the numbers were predicted and where they are located for reference       Expected  Outcomes Short Term: Able to state/look up THRR;Long Term: Able to use THRR to govern intensity when exercising independently;Short Term: Able to use daily as guideline for intensity in rehab       Able to check pulse independently Yes       Intervention Provide education and demonstration on how to check pulse in carotid and radial arteries.;Review the importance of being able to check your own pulse for safety during independent exercise       Expected Outcomes Short Term: Able to explain why pulse checking is important during independent exercise;Long Term: Able to check pulse independently and accurately       Understanding of Exercise Prescription Yes       Intervention Provide education, explanation, and written materials on patient's individual exercise prescription       Expected Outcomes Short Term: Able to explain program exercise prescription;Long Term: Able to explain home exercise prescription to exercise independently                Copy of goals given to participant.

## 2023-06-13 NOTE — Progress Notes (Signed)
Assessment start time: 2:37 PM  Digestive issues/concerns: no known food allergies   24-hours Recall: B: banana, cereal L: nuts D: bbq chicken, veggies, baked beans  Beverages water (48oz) Alcohol none Caffeine none  Education r/t nutrition plan Patient drinking 48oz of water consistently. Has made a lot of changes to cut back on sodium, processed foods and sugar. Educated on types of fats, sources, and how to read a label. Reviewed 24hr food recall, spoke on ways to meet nutritional needs when appetite is down. Encouraged more balanced plates with a protein or healthy fat paired with a carb. Provided handout with examples and built out several meals and snacks with foods he likes and will eat focusing on reducing saturated fat and pairing proteins and carbs together.   Goal 1: Pair a protein or healthy fat with a complex carb Goal 2: Continue to limit fried foods and fatty meats.  Goal 3: Continue to watch sodium and saturated fat  End time 3:24 PM

## 2023-06-13 NOTE — Progress Notes (Signed)
Cardiac Individual Treatment Plan  Patient Details  Name: Joshua Leblanc. MRN: [id number] Date of Birth: 10-19-1975 Referring Provider:   Flowsheet Row Cardiac Rehab from 06/13/2023 in [facility] Cardiac and Pulmonary Rehab  Referring Provider Rita Ohara       Initial Encounter Date:  Flowsheet Row Cardiac Rehab from 06/13/2023 in [facility] Cardiac and Pulmonary Rehab  Date 06/13/23       Visit Diagnosis: ST elevation myocardial infarction (STEMI), unspecified artery [facility])  Status post coronary artery stent placement  Patient's Home Medications on Admission:  Current Outpatient Medications:    aspirin EC 81 MG tablet, Take 1 tablet (81 mg total) by mouth daily. Swallow whole., Disp: 60 tablet, Rfl: 0   atorvastatin (LIPITOR) 80 MG tablet, Take 1 tablet (80 mg total) by mouth daily., Disp: 30 tablet, Rfl: 5   cephALEXin (KEFLEX) 500 MG capsule, Take by mouth., Disp: , Rfl:    diazepam (VALIUM) 5 MG tablet, Take 0.5 to 1 tab every 8 hours as needed for anxiety., Disp: , Rfl:    FLUoxetine (PROZAC) 20 MG capsule, Take by mouth., Disp: , Rfl:    lisinopril (ZESTRIL) 2.5 MG tablet, Take 1 tablet (2.5 mg total) by mouth daily., Disp: 30 tablet, Rfl: 5   metoprolol succinate (TOPROL-XL) 25 MG 24 hr tablet, Take 1 tablet (25 mg total) by mouth once daily., Disp: 30 tablet, Rfl: 5   nicotine (NICODERM CQ - DOSED IN MG/24 HOURS) 21 mg/24hr patch, Place 1 patch (21 mg total) onto the skin daily., Disp: , Rfl:    prasugrel (EFFIENT) 10 MG TABS tablet, Take 1 tablet (10 mg total) by mouth once daily., Disp: 30 tablet, Rfl: 5   spironolactone (ALDACTONE) 25 MG tablet, Take 0.5 tablets (12.5 mg total) by mouth daily. (Patient not taking: Reported on 06/08/2023), Disp: 30 tablet, Rfl: 0  Past Medical History: Past Medical History:  Diagnosis Date   Anxiety    Depression    Headache    Neck pain     Tobacco Use: Social History   Tobacco Use  Smoking Status Every Day   Current  packs/day: 1.00   Average packs/day: 1 pack/day for 15.0 years (15.0 ttl pk-yrs)   Types: Cigarettes  Smokeless Tobacco Never    Labs: Review Flowsheet       Latest Ref Rng & Units 05/21/2023  Labs for ITP Cardiac and Pulmonary Rehab  Cholestrol 0 - 200 mg/dL 086   LDL (calc) 0 - 99 mg/dL 94   HDL-C >57 mg/dL 32   Trlycerides <846 mg/dL 962   Hemoglobin X5M 4.8 - 5.6 % 5.4     Details             Exercise Target Goals: Exercise Program Goal: Individual exercise prescription set using results from initial 6 min walk test and THRR while considering  patient's activity barriers and safety.   Exercise Prescription Goal: Initial exercise prescription builds to 30-45 minutes a day of aerobic activity, 2-3 days per week.  Home exercise guidelines will be given to patient during program as part of exercise prescription that the participant will acknowledge.   Education: Aerobic Exercise: - Group verbal and visual presentation on the components of exercise prescription. Introduces F.I.T.T principle from ACSM for exercise prescriptions.  Reviews F.I.T.T. principles of aerobic exercise including progression. Written material given at graduation.   Education: Resistance Exercise: - Group verbal and visual presentation on the components of exercise prescription. Introduces F.I.T.T principle from ACSM for exercise prescriptions  Reviews F.I.T.T. principles of resistance exercise including progression. Written material given at graduation.    Education: Exercise & Equipment Safety: - Individual verbal instruction and demonstration of equipment use and safety with use of the equipment. Flowsheet Row Cardiac Rehab from 06/13/2023 in [facility] Cardiac and Pulmonary Rehab  Date 06/13/23  Educator [facility]  Instruction Review Code 1- Verbalizes Understanding       Education: Exercise Physiology & General Exercise Guidelines: - Group verbal and written instruction with models to review the  exercise physiology of the cardiovascular system and associated critical values. Provides general exercise guidelines with specific guidelines to those with heart or lung disease.    Education: Flexibility, Balance, Mind/Body Relaxation: - Group verbal and visual presentation with interactive activity on the components of exercise prescription. Introduces F.I.T.T principle from ACSM for exercise prescriptions. Reviews F.I.T.T. principles of flexibility and balance exercise training including progression. Also discusses the mind body connection.  Reviews various relaxation techniques to help reduce and manage stress (i.e. Deep breathing, progressive muscle relaxation, and visualization). Balance handout provided to take home. Written material given at graduation.   Activity Barriers & Risk Stratification:  Activity Barriers & Cardiac Risk Stratification - 06/13/23 1535       Activity Barriers & Cardiac Risk Stratification   Activity Barriers None    Cardiac Risk Stratification Moderate             6 Minute Walk:  6 Minute Walk     Row Name 06/13/23 1527         6 Minute Walk   Phase Initial     Distance 1280 feet     Walk Time 6 minutes     # of Rest Breaks 0     MPH 2.42     METS 4.3     RPE 9     Perceived Dyspnea  0     VO2 Peak 15.1     Symptoms No     Resting HR 70 bpm     Resting BP 112/62     Resting Oxygen Saturation  99 %     Exercise Oxygen Saturation  during 6 min walk 98 %     Max Ex. HR 88 bpm     Max Ex. BP 146/70     2 Minute Post BP 120/70              Oxygen Initial Assessment:   Oxygen Re-Evaluation:   Oxygen Discharge (Final Oxygen Re-Evaluation):   Initial Exercise Prescription:  Initial Exercise Prescription - 06/13/23 1500       Date of Initial Exercise RX and Referring Provider   Date 06/13/23    Referring Provider Rita Ohara      Oxygen   Maintain Oxygen Saturation 88% or higher      Treadmill   MPH 2.7     Grade 1    Minutes 15    METs 4.3      NuStep   Level 3    SPM 80    Minutes 15    METs 4.3      Elliptical   Level 1    Speed 3    Minutes 15    METs 4.3      REL-XR   Level 3    Speed 50    Minutes 15    METs 4.3      T5 Nustep   Level 3    SPM 80    Minutes 15  METs 4.3      Prescription Details   Frequency (times per week) 3    Duration Progress to 30 minutes of continuous aerobic without signs/symptoms of physical distress      Intensity   THRR 40-80% of Max Heartrate 111-153    Ratings of Perceived Exertion 11-13    Perceived Dyspnea 0-4      Progression   Progression Continue to progress workloads to maintain intensity without signs/symptoms of physical distress.      Resistance Training   Training Prescription Yes    Weight 4    Reps 10-15             Perform Capillary Blood Glucose checks as needed.  Exercise Prescription Changes:   Exercise Prescription Changes     Row Name 06/13/23 1500             Response to Exercise   Blood Pressure (Admit) 112/62       Blood Pressure (Exercise) 146/70       Blood Pressure (Exit) 120/70       Heart Rate (Admit) 70 bpm       Heart Rate (Exercise) 88 bpm       Heart Rate (Exit) 71 bpm       Oxygen Saturation (Admit) 99 %       Oxygen Saturation (Exercise) 98 %       Oxygen Saturation (Exit) 98 %       Rating of Perceived Exertion (Exercise) 9       Perceived Dyspnea (Exercise) 0       Symptoms none       Comments 6 MWT results                Exercise Comments:   Exercise Goals and Review:   Exercise Goals     Row Name 06/13/23 1531             Exercise Goals   Increase Physical Activity Yes       Intervention Provide advice, education, support and counseling about physical activity/exercise needs.;Develop an individualized exercise prescription for aerobic and resistive training based on initial evaluation findings, risk stratification, comorbidities and participant's  personal goals.       Expected Outcomes Short Term: Attend rehab on a regular basis to increase amount of physical activity.;Long Term: Add in home exercise to make exercise part of routine and to increase amount of physical activity.;Long Term: Exercising regularly at least 3-5 days a week.       Increase Strength and Stamina Yes       Intervention Provide advice, education, support and counseling about physical activity/exercise needs.;Develop an individualized exercise prescription for aerobic and resistive training based on initial evaluation findings, risk stratification, comorbidities and participant's personal goals.       Expected Outcomes Short Term: Increase workloads from initial exercise prescription for resistance, speed, and METs.;Short Term: Perform resistance training exercises routinely during rehab and add in resistance training at home;Long Term: Improve cardiorespiratory fitness, muscular endurance and strength as measured by increased METs and functional capacity ( )       Able to understand and use rate of perceived exertion (RPE) scale Yes       Intervention Provide education and explanation on how to use RPE scale       Expected Outcomes Short Term: Able to use RPE daily in rehab to express subjective intensity level;Long Term:  Able to use RPE to guide intensity level when exercising  independently       Able to understand and use Dyspnea scale Yes       Intervention Provide education and explanation on how to use Dyspnea scale       Expected Outcomes Short Term: Able to use Dyspnea scale daily in rehab to express subjective sense of shortness of breath during exertion;Long Term: Able to use Dyspnea scale to guide intensity level when exercising independently       Knowledge and understanding of Target Heart Rate Range (THRR) Yes       Intervention Provide education and explanation of THRR including how the numbers were predicted and where they are located for reference        Expected Outcomes Short Term: Able to state/look up THRR;Long Term: Able to use THRR to govern intensity when exercising independently;Short Term: Able to use daily as guideline for intensity in rehab       Able to check pulse independently Yes       Intervention Provide education and demonstration on how to check pulse in carotid and radial arteries.;Review the importance of being able to check your own pulse for safety during independent exercise       Expected Outcomes Short Term: Able to explain why pulse checking is important during independent exercise;Long Term: Able to check pulse independently and accurately       Understanding of Exercise Prescription Yes       Intervention Provide education, explanation, and written materials on patient's individual exercise prescription       Expected Outcomes Short Term: Able to explain program exercise prescription;Long Term: Able to explain home exercise prescription to exercise independently                Exercise Goals Re-Evaluation :   Discharge Exercise Prescription (Final Exercise Prescription Changes):  Exercise Prescription Changes - 06/13/23 1500       Response to Exercise   Blood Pressure (Admit) 112/62    Blood Pressure (Exercise) 146/70    Blood Pressure (Exit) 120/70    Heart Rate (Admit) 70 bpm    Heart Rate (Exercise) 88 bpm    Heart Rate (Exit) 71 bpm    Oxygen Saturation (Admit) 99 %    Oxygen Saturation (Exercise) 98 %    Oxygen Saturation (Exit) 98 %    Rating of Perceived Exertion (Exercise) 9    Perceived Dyspnea (Exercise) 0    Symptoms none    Comments 6 MWT results             Nutrition:  Target Goals: Understanding of nutrition guidelines, daily intake of sodium 1500mg , cholesterol 200mg , calories 30% from fat and 7% or less from saturated fats, daily to have 5 or more servings of fruits and vegetables.  Education: All About Nutrition: -Group instruction provided by verbal, written material,  interactive activities, discussions, models, and posters to present general guidelines for heart healthy nutrition including fat, fiber, MyPlate, the role of sodium in heart healthy nutrition, utilization of the nutrition label, and utilization of this knowledge for meal planning. Follow up email sent as well. Written material given at graduation. Flowsheet Row Cardiac Rehab from 06/13/2023 in [facility] Cardiac and Pulmonary Rehab  Education need identified 06/13/23       Biometrics:  Pre Biometrics - 06/13/23 1532       Pre Biometrics   Height 5\' 10"  (1.778 m)    Weight 179 lb 3.2 oz (81.3 kg)    Waist Circumference 35.5 inches  Hip Circumference 40.5 inches    Waist to Hip Ratio 0.88 %    BMI (Calculated) 25.71    Single Leg Stand 30 seconds              Nutrition Therapy Plan and Nutrition Goals:   Nutrition Assessments:  MEDIFICTS Score Key: >=70 Need to make dietary changes  40-70 Heart Healthy Diet <= 40 Therapeutic Level Cholesterol Diet   Picture Your Plate Scores: <46 Unhealthy dietary pattern with much room for improvement. 41-50 Dietary pattern unlikely to meet recommendations for good health and room for improvement. 51-60 More healthful dietary pattern, with some room for improvement.  >60 Healthy dietary pattern, although there may be some specific behaviors that could be improved.    Nutrition Goals Re-Evaluation:   Nutrition Goals Discharge (Final Nutrition Goals Re-Evaluation):   Psychosocial: Target Goals: Acknowledge presence or absence of significant depression and/or stress, maximize coping skills, provide positive support system. Participant is able to verbalize types and ability to use techniques and skills needed for reducing stress and depression.   Education: Stress, Anxiety, and Depression - Group verbal and visual presentation to define topics covered.  Reviews how body is impacted by stress, anxiety, and depression.  Also discusses  healthy ways to reduce stress and to treat/manage anxiety and depression.  Written material given at graduation.   Education: Sleep Hygiene -Provides group verbal and written instruction about how sleep can affect your health.  Define sleep hygiene, discuss sleep cycles and impact of sleep habits. Review good sleep hygiene tips.    Initial Review & Psychosocial Screening:  Initial Psych Review & Screening - 06/08/23 1422       Initial Review   Current issues with Current Stress Concerns    Source of Stress Concerns Financial      Family Dynamics   Good Support System? Yes   family and girlfriend     Barriers   Psychosocial barriers to participate in program There are no identifiable barriers or psychosocial needs.;The patient should benefit from training in stress management and relaxation.      Screening Interventions   Interventions Encouraged to exercise;Provide feedback about the scores to participant;To provide support and resources with identified psychosocial needs    Expected Outcomes Short Term goal: Utilizing psychosocial counselor, staff and physician to assist with identification of specific Stressors or current issues interfering with healing process. Setting desired goal for each stressor or current issue identified.;Long Term Goal: Stressors or current issues are controlled or eliminated.;Short Term goal: Identification and review with participant of any Quality of Life or Depression concerns found by scoring the questionnaire.;Long Term goal: The participant improves quality of Life and PHQ9 Scores as seen by post scores and/or verbalization of changes             Quality of Life Scores:   Quality of Life - 06/13/23 1533       Quality of Life   Select Quality of Life      Quality of Life Scores   Health/Function Pre 13.23 %    Socioeconomic Pre 16.19 %    Psych/Spiritual Pre 15.43 %    Family Pre 24.8 %    GLOBAL Pre 16 %            Scores of 19 and  below usually indicate a poorer quality of life in these areas.  A difference of  2-3 points is a clinically meaningful difference.  A difference of 2-3 points in the total  score of the Quality of Life Index has been associated with significant improvement in overall quality of life, self-image, physical symptoms, and general health in studies assessing change in quality of life.  PHQ-9: Review Flowsheet       06/13/2023 09/25/2015 05/12/2015  Depression screen PHQ 2/9  Decreased Interest 1 3 2   Down, Depressed, Hopeless 1 3 0  PHQ - 2 Score 2 6 2   Altered sleeping 1 3 1   Tired, decreased energy 1 3 3   Change in appetite 0 0 1  Feeling bad or failure about yourself  0 1 3  Trouble concentrating 0 0 0  Moving slowly or fidgety/restless 0 0 0  Suicidal thoughts 0 0 0  PHQ-9 Score 4 13 10   Difficult doing work/chores Somewhat difficult Somewhat difficult Not difficult at all    Details           Interpretation of Total Score  Total Score Depression Severity:  1-4 = Minimal depression, 5-9 = Mild depression, 10-14 = Moderate depression, 15-19 = Moderately severe depression, 20-27 = Severe depression   Psychosocial Evaluation and Intervention:  Psychosocial Evaluation - 06/08/23 1437       Psychosocial Evaluation & Interventions   Interventions Encouraged to exercise with the program and follow exercise prescription;Stress management education;Relaxation education    Comments Mr. Boissonneault is coming to cardiac rehab post STEMI and Stent. He said this was a wake up call and has been making big lifestyle changes. He has his [employer], so he is on hold from working until he starts the program. He then will have to do a physical and undergo a stress test to be able to do his job fully. He states this has been a stressor being off of work since he supports himself. His family and girlfriend have been helpful in motivating him, but he is ready to get back to work and his normal routine. He has made a  lot of diet changes and he has cut his smoking from 2 packs a day to about 5 cigarettes. He knows he needs to quit and is trying to. He has a history of anxiety and depression which he states is stable at this time. His biggest concern in getting back to work at full capacity and learning how to live a heart healthy lifestyle    Expected Outcomes Short: attend cardiac rehab for education and exercise. Long; Develop and maintain positive self care habits    Continue Psychosocial Services  Follow up required by staff             Psychosocial Re-Evaluation:   Psychosocial Discharge (Final Psychosocial Re-Evaluation):   Vocational Rehabilitation: Provide vocational rehab assistance to qualifying candidates.   Vocational Rehab Evaluation & Intervention:  Vocational Rehab - 06/13/23 1534       Initial Vocational Rehab Evaluation & Intervention   Assessment shows need for Vocational Rehabilitation No             Education: Education Goals: Education classes will be provided on a variety of topics geared toward better understanding of heart health and risk factor modification. Participant will state understanding/return demonstration of topics presented as noted by education test scores.  Learning Barriers/Preferences:  Learning Barriers/Preferences - 06/08/23 1421       Learning Barriers/Preferences   Learning Barriers None    Learning Preferences None             General Cardiac Education Topics:  AED/CPR: - Group verbal and written instruction with  the use of models to demonstrate the basic use of the AED with the basic ABC's of resuscitation.   Anatomy and Cardiac Procedures: - Group verbal and visual presentation and models provide information about basic cardiac anatomy and function. Reviews the testing methods done to diagnose heart disease and the outcomes of the test results. Describes the treatment choices: Medical Management, Angioplasty, or Coronary Bypass  Surgery for treating various heart conditions including Myocardial Infarction, Angina, Valve Disease, and Cardiac Arrhythmias.  Written material given at graduation. Flowsheet Row Cardiac Rehab from 06/13/2023 in [facility] Cardiac and Pulmonary Rehab  Education need identified 06/13/23       Medication Safety: - Group verbal and visual instruction to review commonly prescribed medications for heart and lung disease. Reviews the medication, class of the drug, and side effects. Includes the steps to properly store meds and maintain the prescription regimen.  Written material given at graduation.   Intimacy: - Group verbal instruction through game format to discuss how heart and lung disease can affect sexual intimacy. Written material given at graduation..   Know Your Numbers and Heart Failure: - Group verbal and visual instruction to discuss disease risk factors for cardiac and pulmonary disease and treatment options.  Reviews associated critical values for Overweight/Obesity, Hypertension, Cholesterol, and Diabetes.  Discusses basics of heart failure: signs/symptoms and treatments.  Introduces Heart Failure Zone chart for action plan for heart failure.  Written material given at graduation. Flowsheet Row Cardiac Rehab from 06/13/2023 in [facility] Cardiac and Pulmonary Rehab  Education need identified 06/13/23       Infection Prevention: - Provides verbal and written material to individual with discussion of infection control including proper hand washing and proper equipment cleaning during exercise session. Flowsheet Row Cardiac Rehab from 06/13/2023 in [facility] Cardiac and Pulmonary Rehab  Date 06/13/23  Educator [facility]  Instruction Review Code 1- Verbalizes Understanding       Falls Prevention: - Provides verbal and written material to individual with discussion of falls prevention and safety. Flowsheet Row Cardiac Rehab from 06/13/2023 in [facility] Cardiac and Pulmonary Rehab  Date 06/13/23  Educator  [facility]  Instruction Review Code 1- Verbalizes Understanding       Other: -Provides group and verbal instruction on various topics (see comments)   Knowledge Questionnaire Score:  Knowledge Questionnaire Score - 06/13/23 1534       Knowledge Questionnaire Score   Pre Score 22/26             Core Components/Risk Factors/Patient Goals at Admission:  Personal Goals and Risk Factors at Admission - 06/13/23 1526       Core Components/Risk Factors/Patient Goals on Admission    Weight Management Yes    Intervention Weight Management: Develop a combined nutrition and exercise program designed to reach desired caloric intake, while maintaining appropriate intake of nutrient and fiber, sodium and fats, and appropriate energy expenditure required for the weight goal.;Weight Management: Provide education and appropriate resources to help participant work on and attain dietary goals.;Weight Management/Obesity: Establish reasonable short term and long term weight goals.    Admit Weight 179 lb 3.2 oz (81.3 kg)    Goal Weight: Short Term 179 lb (81.2 kg)    Goal Weight: Long Term 179 lb (81.2 kg)    Expected Outcomes Short Term: Continue to assess and modify interventions until short term weight is achieved;Long Term: Adherence to nutrition and physical activity/exercise program aimed toward attainment of established weight goal;Weight Maintenance: Understanding of the daily nutrition guidelines, which  includes 25-35% calories from fat, 7% or less cal from saturated fats, less than 200mg  cholesterol, less than 1.5gm of sodium, & 5 or more servings of fruits and vegetables daily;Understanding recommendations for meals to include 15-35% energy as protein, 25-35% energy from fat, 35-60% energy from carbohydrates, less than 200mg  of dietary cholesterol, 20-35 gm of total fiber daily;Understanding of distribution of calorie intake throughout the day with the consumption of 4-5 meals/snacks    Tobacco  Cessation Yes    Number of packs per day 1/2    Intervention Assist the participant in steps to quit. Provide individualized education and counseling about committing to Tobacco Cessation, relapse prevention, and pharmacological support that can be provided by physician.;Education officer, environmental, assist with locating and accessing local/national Quit Smoking programs, and support quit date choice.    Expected Outcomes Short Term: Will demonstrate readiness to quit, by selecting a quit date.;Short Term: Will quit all tobacco product use, adhering to prevention of relapse plan.;Long Term: Complete abstinence from all tobacco products for at least 12 months from quit date.    Intervention Provide education and support for participant on nutrition & aerobic/resistive exercise along with prescribed medications to achieve LDL 70mg , HDL >40mg .    Expected Outcomes Short Term: Participant states understanding of desired cholesterol values and is compliant with medications prescribed. Participant is following exercise prescription and nutrition guidelines.;Long Term: Cholesterol controlled with medications as prescribed, with individualized exercise RX and with personalized nutrition plan. Value goals: LDL < 70mg , HDL > 40 mg.             Education:Diabetes - Individual verbal and written instruction to review signs/symptoms of diabetes, desired ranges of glucose level fasting, after meals and with exercise. Acknowledge that pre and post exercise glucose checks will be done for 3 sessions at entry of program.   Core Components/Risk Factors/Patient Goals Review:   Goals and Risk Factor Review     Row Name 06/13/23 1526             Core Components/Risk Factors/Patient Goals Review   Personal Goals Review Tobacco Cessation       Review Reita Cliche is a current tobacco user. Intervention for tobacco cessation was provided at the initial medical review. He was asked about readiness to quit and reported  he has cut back to a half pack a day and is considering the steps he needs to take to quit smoking . Patient was advised and educated about tobacco cessation using combination therapy, tobacco cessation classes, quit line, and quit smoking apps. Patient demonstrated understanding of this material. Staff will continue to provide encouragement and follow up with the patient throughout the program.                Core Components/Risk Factors/Patient Goals at Discharge (Final Review):   Goals and Risk Factor Review - 06/13/23 1526       Core Components/Risk Factors/Patient Goals Review   Personal Goals Review Tobacco Cessation    Review Reita Cliche is a current tobacco user. Intervention for tobacco cessation was provided at the initial medical review. He was asked about readiness to quit and reported he has cut back to a half pack a day and is considering the steps he needs to take to quit smoking . Patient was advised and educated about tobacco cessation using combination therapy, tobacco cessation classes, quit line, and quit smoking apps. Patient demonstrated understanding of this material. Staff will continue to provide encouragement and follow up with the patient  throughout the program.             ITP Comments:  ITP Comments     Row Name 06/08/23 1429 06/13/23 1523         ITP Comments Initial phone call completed. Diagnosis can be found in [facility] 10/12. EP Orientation scheduled for Monday 11/4 1:30.   Deano is a current tobacco user. Intervention for tobacco cessation was provided at the initial medical review. He was asked about readiness to quit and reported that he is ready to start trying. He has reduced his smoking from 2 packs to 5 cigs a day . Patient was advised and educated about tobacco cessation using combination therapy, tobacco cessation classes, quit line, and quit smoking apps. Patient demonstrated understanding of this material. Staff will continue to provide encouragement  and follow up with the patient throughout the program. Completed and gym orientation. Initial ITP created and sent for review to Dr. Bethann Punches, Medical Director. Reita Cliche is a current tobacco user. Intervention for tobacco cessation was provided at the initial medical review. He was asked about readiness to quit and reported he has cut back to a half pack a day and is considering the steps he needs to take to quit smoking . Patient was advised and educated about tobacco cessation using combination therapy, tobacco cessation classes, quit line, and quit smoking apps. Patient demonstrated understanding of this material. Staff will continue to provide encouragement and follow up with the patient throughout the program.               Comments: initial ITP

## 2023-06-14 ENCOUNTER — Other Ambulatory Visit: Payer: Self-pay

## 2023-06-14 MED ORDER — ASPIRIN 81 MG PO TBEC
81.0000 mg | DELAYED_RELEASE_TABLET | Freq: Every day | ORAL | 0 refills | Status: AC
  Filled 2023-06-14: qty 60, 60d supply, fill #0

## 2023-06-14 MED ORDER — SPIRONOLACTONE 25 MG PO TABS
12.5000 mg | ORAL_TABLET | Freq: Every day | ORAL | 0 refills | Status: AC
  Filled 2023-06-14: qty 15, 30d supply, fill #0

## 2023-06-15 ENCOUNTER — Encounter: Payer: BC Managed Care – PPO | Admitting: *Deleted

## 2023-06-15 DIAGNOSIS — Z48812 Encounter for surgical aftercare following surgery on the circulatory system: Secondary | ICD-10-CM | POA: Diagnosis not present

## 2023-06-15 DIAGNOSIS — Z955 Presence of coronary angioplasty implant and graft: Secondary | ICD-10-CM

## 2023-06-15 DIAGNOSIS — I213 ST elevation (STEMI) myocardial infarction of unspecified site: Secondary | ICD-10-CM

## 2023-06-15 NOTE — Progress Notes (Signed)
Daily Session Note  Patient Details  Name: Joshua Leblanc. MRN: [id number] Date of Birth: 06/09/1976 Referring Provider:   Flowsheet Row Cardiac Rehab from 06/13/2023 in [facility] Cardiac and Pulmonary Rehab  Referring Provider Rita Ohara       Encounter Date: 06/15/2023  Check In:  Session Check In - 06/15/23 1721       Check-In   Supervising physician immediately available to respond to emergencies See telemetry face sheet for immediately available ER MD    Location [facility]-Cardiac & Pulmonary Rehab    Staff Present Susann Givens, RN BSN;Joseph Delleker, RCP,RRT,BSRT;Kelly [place], [place], ACSM CEP, Exercise Physiologist    Virtual Visit No    Medication changes reported     No    Fall or balance concerns reported    No    Tobacco Cessation No Change    Current number of cigarettes/nicotine per day     10    Warm-up and Cool-down Performed on first and last piece of equipment    Resistance Training Performed Yes    VAD Patient? No    PAD/SET Patient? No      Pain Assessment   Currently in Pain? No/denies                Social History   Tobacco Use  Smoking Status Every Day   Current packs/day: 1.00   Average packs/day: 1 pack/day for 15.0 years (15.0 ttl pk-yrs)   Types: Cigarettes  Smokeless Tobacco Never    Goals Met:  Independence with exercise equipment Exercise tolerated well No report of concerns or symptoms today Strength training completed today  Goals Unmet:  Not Applicable  Comments: First full day of exercise!  Patient was oriented to gym and equipment including functions, settings, policies, and procedures.  Patient's individual exercise prescription and treatment plan were reviewed.  All starting workloads were established based on the results of the 6 minute walk test done at initial orientation visit.  The plan for exercise progression was also introduced and progression will be customized based on patient's performance and  goals.    Dr. Bethann Punches is Medical Director for [facility] Cardiac Rehabilitation.  Dr. Vida Rigger is Medical Director for [facility] Pulmonary Rehabilitation.

## 2023-06-16 ENCOUNTER — Ambulatory Visit: Payer: Self-pay

## 2023-06-16 DIAGNOSIS — Z48812 Encounter for surgical aftercare following surgery on the circulatory system: Secondary | ICD-10-CM | POA: Diagnosis not present

## 2023-06-16 DIAGNOSIS — Z955 Presence of coronary angioplasty implant and graft: Secondary | ICD-10-CM

## 2023-06-16 DIAGNOSIS — I213 ST elevation (STEMI) myocardial infarction of unspecified site: Secondary | ICD-10-CM

## 2023-06-16 NOTE — Progress Notes (Signed)
Daily Session Note  Patient Details  Name: Joshua Leblanc. MRN: [id number] Date of Birth: 1976-07-01 Referring Provider:   Flowsheet Row Cardiac Rehab from 06/13/2023 in [facility] Cardiac and Pulmonary Rehab  Referring Provider Rita Ohara       Encounter Date: 06/16/2023  Check In:  Session Check In - 06/16/23 1717       Check-In   Supervising physician immediately available to respond to emergencies See telemetry face sheet for immediately available ER MD    Location [facility]-Cardiac & Pulmonary Rehab    Staff Present Susann Givens, RN BSN;Joseph Hood, RCP,RRT,BSRT;Maxon Goree BS, , Exercise Physiologist;Margaret Best, MS, Exercise Physiologist    Virtual Visit No    Medication changes reported     No    Fall or balance concerns reported    No    Tobacco Cessation No Change    Current number of cigarettes/nicotine per day     10    Warm-up and Cool-down Performed on first and last piece of equipment    Resistance Training Performed Yes    VAD Patient? No    PAD/SET Patient? No      Pain Assessment   Currently in Pain? No/denies                Social History   Tobacco Use  Smoking Status Every Day   Current packs/day: 1.00   Average packs/day: 1 pack/day for 15.0 years (15.0 ttl pk-yrs)   Types: Cigarettes  Smokeless Tobacco Never    Goals Met:  Independence with exercise equipment Exercise tolerated well No report of concerns or symptoms today Strength training completed today  Goals Unmet:  Not Applicable  Comments: Pt able to follow exercise prescription today without complaint.  Will continue to monitor for progression.    Dr. Bethann Punches is Medical Director for [facility] Cardiac Rehabilitation.  Dr. Vida Rigger is Medical Director for [facility] Pulmonary Rehabilitation.

## 2023-06-20 ENCOUNTER — Encounter: Payer: BC Managed Care – PPO | Admitting: *Deleted

## 2023-06-20 DIAGNOSIS — Z955 Presence of coronary angioplasty implant and graft: Secondary | ICD-10-CM

## 2023-06-20 DIAGNOSIS — Z48812 Encounter for surgical aftercare following surgery on the circulatory system: Secondary | ICD-10-CM | POA: Diagnosis not present

## 2023-06-20 DIAGNOSIS — I213 ST elevation (STEMI) myocardial infarction of unspecified site: Secondary | ICD-10-CM

## 2023-06-20 NOTE — Progress Notes (Signed)
Daily Session Note  Patient Details  Name: Joshua Leblanc. MRN: [id number] Date of Birth: Jan 02, 1976 Referring Provider:   Flowsheet Row Cardiac Rehab from 06/13/2023 in [facility] Cardiac and Pulmonary Rehab  Referring Provider Rita Ohara       Encounter Date: 06/20/2023  Check In:  Session Check In - 06/20/23 1714       Check-In   Supervising physician immediately available to respond to emergencies See telemetry face sheet for immediately available ER MD    Location [facility]-Cardiac & Pulmonary Rehab    Staff Present Susann Givens, RN BSN;Joseph [place], RCP,RRT,BSRT;Kelly [place], [place], ACSM CEP, Exercise Physiologist    Virtual Visit No    Medication changes reported     No    Fall or balance concerns reported    No    Tobacco Cessation Use Decreased    Current number of cigarettes/nicotine per day     9    Warm-up and Cool-down Performed on first and last piece of equipment    Resistance Training Performed Yes    VAD Patient? No    PAD/SET Patient? No      Pain Assessment   Currently in Pain? No/denies                Social History   Tobacco Use  Smoking Status Every Day   Current packs/day: 1.00   Average packs/day: 1 pack/day for 15.0 years (15.0 ttl pk-yrs)   Types: Cigarettes  Smokeless Tobacco Never    Goals Met:  Independence with exercise equipment Exercise tolerated well No report of concerns or symptoms today Strength training completed today  Goals Unmet:  Not Applicable  Comments: Pt able to follow exercise prescription today without complaint.  Will continue to monitor for progression.    Dr. Bethann Punches is Medical Director for [facility] Cardiac Rehabilitation.  Dr. Vida Rigger is Medical Director for [facility] Pulmonary Rehabilitation.

## 2023-06-22 ENCOUNTER — Encounter: Payer: BC Managed Care – PPO | Admitting: *Deleted

## 2023-06-22 DIAGNOSIS — I213 ST elevation (STEMI) myocardial infarction of unspecified site: Secondary | ICD-10-CM

## 2023-06-22 DIAGNOSIS — Z48812 Encounter for surgical aftercare following surgery on the circulatory system: Secondary | ICD-10-CM | POA: Diagnosis not present

## 2023-06-22 DIAGNOSIS — Z955 Presence of coronary angioplasty implant and graft: Secondary | ICD-10-CM

## 2023-06-22 NOTE — Progress Notes (Signed)
Daily Session Note  Patient Details  Name: Joshua Leblanc. MRN: [id number] Date of Birth: April 13, 1976 Referring Provider:   Flowsheet Row Cardiac Rehab from 06/13/2023 in [facility] Cardiac and Pulmonary Rehab  Referring Provider Rita Ohara       Encounter Date: 06/22/2023  Check In:  Session Check In - 06/22/23 1728       Check-In   Supervising physician immediately available to respond to emergencies See telemetry face sheet for immediately available ER MD    Location [facility]-Cardiac & Pulmonary Rehab    Staff Present Susann Givens, RN Mabeline Caras, BS, ACSM CEP, Exercise Physiologist;Joseph Reino [place], [place]    Virtual Visit No    Medication changes reported     No    Fall or balance concerns reported    No    Tobacco Cessation No Change    Current number of cigarettes/nicotine per day     9    Warm-up and Cool-down Performed on first and last piece of equipment    Resistance Training Performed Yes    VAD Patient? No    PAD/SET Patient? No      Pain Assessment   Currently in Pain? No/denies                Social History   Tobacco Use  Smoking Status Every Day   Current packs/day: 1.00   Average packs/day: 1 pack/day for 15.0 years (15.0 ttl pk-yrs)   Types: Cigarettes  Smokeless Tobacco Never    Goals Met:  Independence with exercise equipment Exercise tolerated well No report of concerns or symptoms today Strength training completed today  Goals Unmet:  Not Applicable  Comments: Pt able to follow exercise prescription today without complaint.  Will continue to monitor for progression.    Dr. Bethann Punches is Medical Director for [facility] Cardiac Rehabilitation.  Dr. Vida Rigger is Medical Director for [facility] Pulmonary Rehabilitation.

## 2023-06-23 ENCOUNTER — Encounter: Payer: BC Managed Care – PPO | Admitting: *Deleted

## 2023-06-23 DIAGNOSIS — Z955 Presence of coronary angioplasty implant and graft: Secondary | ICD-10-CM

## 2023-06-23 DIAGNOSIS — I213 ST elevation (STEMI) myocardial infarction of unspecified site: Secondary | ICD-10-CM

## 2023-06-23 DIAGNOSIS — Z48812 Encounter for surgical aftercare following surgery on the circulatory system: Secondary | ICD-10-CM | POA: Diagnosis not present

## 2023-06-23 NOTE — Progress Notes (Signed)
Daily Session Note  Patient Details  Name: Joshua Leblanc. MRN: [id number] Date of Birth: 07-Nov-1975 Referring Provider:   Flowsheet Row Cardiac Rehab from 06/13/2023 in [facility] Cardiac and Pulmonary Rehab  Referring Provider Rita Ohara       Encounter Date: 06/23/2023  Check In:  Session Check In - 06/23/23 1714       Check-In   Supervising physician immediately available to respond to emergencies See telemetry face sheet for immediately available ER MD    Location [facility]-Cardiac & Pulmonary Rehab    Staff Present Elige Ko, RCP,RRT,BSRT;Stori Royse Jewel Baize, RN BSN;Maxon [place]etta BS, , Exercise Physiologist    Virtual Visit No    Medication changes reported     No    Fall or balance concerns reported    No    Tobacco Cessation No Change    Current number of cigarettes/nicotine per day     9    Warm-up and Cool-down Performed on first and last piece of equipment    Resistance Training Performed Yes    VAD Patient? No    PAD/SET Patient? No      Pain Assessment   Currently in Pain? No/denies                Social History   Tobacco Use  Smoking Status Every Day   Current packs/day: 1.00   Average packs/day: 1 pack/day for 15.0 years (15.0 ttl pk-yrs)   Types: Cigarettes  Smokeless Tobacco Never    Goals Met:  Independence with exercise equipment Exercise tolerated well No report of concerns or symptoms today Strength training completed today  Goals Unmet:  Not Applicable  Comments: Pt able to follow exercise prescription today without complaint.  Will continue to monitor for progression.    Dr. Bethann Punches is Medical Director for [facility] Cardiac Rehabilitation.  Dr. Vida Rigger is Medical Director for [facility] Pulmonary Rehabilitation.

## 2023-06-27 ENCOUNTER — Encounter: Payer: BC Managed Care – PPO | Admitting: *Deleted

## 2023-06-27 DIAGNOSIS — Z48812 Encounter for surgical aftercare following surgery on the circulatory system: Secondary | ICD-10-CM | POA: Diagnosis not present

## 2023-06-27 DIAGNOSIS — Z955 Presence of coronary angioplasty implant and graft: Secondary | ICD-10-CM

## 2023-06-27 DIAGNOSIS — I213 ST elevation (STEMI) myocardial infarction of unspecified site: Secondary | ICD-10-CM

## 2023-06-27 NOTE — Progress Notes (Signed)
Daily Session Note  Patient Details  Name: Joshua Leblanc. MRN: [id number] Date of Birth: Mar 04, 1976 Referring Provider:   Flowsheet Row Cardiac Rehab from 06/13/2023 in [facility] Cardiac and Pulmonary Rehab  Referring Provider Rita Ohara       Encounter Date: 06/27/2023  Check In:  Session Check In - 06/27/23 1718       Check-In   Supervising physician immediately available to respond to emergencies See telemetry face sheet for immediately available ER MD    Location [facility]-Cardiac & Pulmonary Rehab    Staff Present Susann Givens, RN BSN;Joseph [place], RCP,RRT,BSRT;Kelly [place], [place], ACSM CEP, Exercise Physiologist    Virtual Visit No    Medication changes reported     No    Fall or balance concerns reported    No    Tobacco Cessation No Change    Current number of cigarettes/nicotine per day     9    Warm-up and Cool-down Performed on first and last piece of equipment    Resistance Training Performed Yes    VAD Patient? No    PAD/SET Patient? No      Pain Assessment   Currently in Pain? No/denies                Social History   Tobacco Use  Smoking Status Every Day   Current packs/day: 1.00   Average packs/day: 1 pack/day for 15.0 years (15.0 ttl pk-yrs)   Types: Cigarettes  Smokeless Tobacco Never    Goals Met:  Independence with exercise equipment Exercise tolerated well No report of concerns or symptoms today Strength training completed today  Goals Unmet:  Not Applicable  Comments: Pt able to follow exercise prescription today without complaint.  Will continue to monitor for progression.    Dr. Bethann Punches is Medical Director for [facility] Cardiac Rehabilitation.  Dr. Vida Rigger is Medical Director for [facility] Pulmonary Rehabilitation.

## 2023-06-29 ENCOUNTER — Encounter: Payer: Self-pay | Admitting: *Deleted

## 2023-06-29 ENCOUNTER — Encounter: Payer: BC Managed Care – PPO | Admitting: *Deleted

## 2023-06-29 DIAGNOSIS — Z955 Presence of coronary angioplasty implant and graft: Secondary | ICD-10-CM

## 2023-06-29 DIAGNOSIS — I213 ST elevation (STEMI) myocardial infarction of unspecified site: Secondary | ICD-10-CM

## 2023-06-29 DIAGNOSIS — Z48812 Encounter for surgical aftercare following surgery on the circulatory system: Secondary | ICD-10-CM | POA: Diagnosis not present

## 2023-06-29 NOTE — Progress Notes (Signed)
Cardiac Individual Treatment Plan  Patient Details  Name: Joshua Leblanc. MRN: [id number] Date of Birth: 08-23-1975 Referring Provider:   Flowsheet Row Cardiac Rehab from 06/13/2023 in [facility] Cardiac and Pulmonary Rehab  Referring Provider Rita Ohara       Initial Encounter Date:  Flowsheet Row Cardiac Rehab from 06/13/2023 in [facility] Cardiac and Pulmonary Rehab  Date 06/13/23       Visit Diagnosis: ST elevation myocardial infarction (STEMI), unspecified artery [facility])  Status post coronary artery stent placement  Patient's Home Medications on Admission:  Current Outpatient Medications:    aspirin EC 81 MG tablet, Take 1 tablet (81 mg total) by mouth daily. Swallow whole., Disp: 60 tablet, Rfl: 0   atorvastatin (LIPITOR) 80 MG tablet, Take 1 tablet (80 mg total) by mouth daily., Disp: 30 tablet, Rfl: 5   atorvastatin (LIPITOR) 80 MG tablet, Take 1 tablet (80 mg total) by mouth once daily, Disp: 30 tablet, Rfl: 5   cephALEXin (KEFLEX) 500 MG capsule, Take by mouth., Disp: , Rfl:    diazepam (VALIUM) 5 MG tablet, Take 0.5 to 1 tab every 8 hours as needed for anxiety., Disp: , Rfl:    FLUoxetine (PROZAC) 20 MG capsule, Take by mouth., Disp: , Rfl:    lisinopril (ZESTRIL) 2.5 MG tablet, Take 1 tablet (2.5 mg total) by mouth daily., Disp: 30 tablet, Rfl: 5   lisinopril (ZESTRIL) 2.5 MG tablet, Take 1 tablet (2.5 mg total) by mouth once daily, Disp: 30 tablet, Rfl: 5   metoprolol succinate (TOPROL-XL) 25 MG 24 hr tablet, Take 1 tablet (25 mg total) by mouth once daily., Disp: 30 tablet, Rfl: 5   metoprolol succinate (TOPROL-XL) 25 MG 24 hr tablet, Take 1 tablet (25 mg total) by mouth once daily, Disp: 30 tablet, Rfl: 5   nicotine (NICODERM CQ - DOSED IN MG/24 HOURS) 21 mg/24hr patch, Place 1 patch (21 mg total) onto the skin daily., Disp: , Rfl:    prasugrel (EFFIENT) 10 MG TABS tablet, Take 1 tablet (10 mg total) by mouth once daily., Disp: 30 tablet, Rfl: 5   prasugrel  (EFFIENT) 10 MG TABS tablet, Take 1 tablet (10 mg total) by mouth once daily, Disp: 30 tablet, Rfl: 5   spironolactone (ALDACTONE) 25 MG tablet, Take 0.5 tablets (12.5 mg total) by mouth daily., Disp: 30 tablet, Rfl: 0  Past Medical History: Past Medical History:  Diagnosis Date   Anxiety    Depression    Headache    Neck pain     Tobacco Use: Social History   Tobacco Use  Smoking Status Every Day   Current packs/day: 1.00   Average packs/day: 1 pack/day for 15.0 years (15.0 ttl pk-yrs)   Types: Cigarettes  Smokeless Tobacco Never    Labs: Review Flowsheet       Latest Ref Rng & Units 05/21/2023  Labs for ITP Cardiac and Pulmonary Rehab  Cholestrol 0 - 200 mg/dL 409   LDL (calc) 0 - 99 mg/dL 94   HDL-C >81 mg/dL 32   Trlycerides <191 mg/dL 478   Hemoglobin G9F 4.8 - 5.6 % 5.4     Details             Exercise Target Goals: Exercise Program Goal: Individual exercise prescription set using results from initial 6 min walk test and THRR while considering  patient's activity barriers and safety.   Exercise Prescription Goal: Initial exercise prescription builds to 30-45 minutes a day of aerobic activity, 2-3 days per  week.  Home exercise guidelines will be given to patient during program as part of exercise prescription that the participant will acknowledge.   Education: Aerobic Exercise: - Group verbal and visual presentation on the components of exercise prescription. Introduces F.I.T.T principle from ACSM for exercise prescriptions.  Reviews F.I.T.T. principles of aerobic exercise including progression. Written material given at graduation.   Education: Resistance Exercise: - Group verbal and visual presentation on the components of exercise prescription. Introduces F.I.T.T principle from ACSM for exercise prescriptions  Reviews F.I.T.T. principles of resistance exercise including progression. Written material given at graduation.    Education: Exercise &  Equipment Safety: - Individual verbal instruction and demonstration of equipment use and safety with use of the equipment. Flowsheet Row Cardiac Rehab from 06/13/2023 in [facility] Cardiac and Pulmonary Rehab  Date 06/13/23  Educator [facility]  Instruction Review Code 1- Verbalizes Understanding       Education: Exercise Physiology & General Exercise Guidelines: - Group verbal and written instruction with models to review the exercise physiology of the cardiovascular system and associated critical values. Provides general exercise guidelines with specific guidelines to those with heart or lung disease.    Education: Flexibility, Balance, Mind/Body Relaxation: - Group verbal and visual presentation with interactive activity on the components of exercise prescription. Introduces F.I.T.T principle from ACSM for exercise prescriptions. Reviews F.I.T.T. principles of flexibility and balance exercise training including progression. Also discusses the mind body connection.  Reviews various relaxation techniques to help reduce and manage stress (i.e. Deep breathing, progressive muscle relaxation, and visualization). Balance handout provided to take home. Written material given at graduation.   Activity Barriers & Risk Stratification:  Activity Barriers & Cardiac Risk Stratification - 06/13/23 1535       Activity Barriers & Cardiac Risk Stratification   Activity Barriers None    Cardiac Risk Stratification Moderate             6 Minute Walk:  6 Minute Walk     Row Name 06/13/23 1527         6 Minute Walk   Phase Initial     Distance 1280 feet     Walk Time 6 minutes     # of Rest Breaks 0     MPH 2.42     METS 4.3     RPE 9     Perceived Dyspnea  0     VO2 Peak 15.1     Symptoms No     Resting HR 70 bpm     Resting BP 112/62     Resting Oxygen Saturation  99 %     Exercise Oxygen Saturation  during 6 min walk 98 %     Max Ex. HR 88 bpm     Max Ex. BP 146/70     2 Minute Post BP  120/70              Oxygen Initial Assessment:   Oxygen Re-Evaluation:   Oxygen Discharge (Final Oxygen Re-Evaluation):   Initial Exercise Prescription:  Initial Exercise Prescription - 06/13/23 1500       Date of Initial Exercise RX and Referring Provider   Date 06/13/23    Referring Provider Rita Ohara      Oxygen   Maintain Oxygen Saturation 88% or higher      Treadmill   MPH 2.7    Grade 1    Minutes 15    METs 4.3      NuStep   Level  3    SPM 80    Minutes 15    METs 4.3      Elliptical   Level 1    Speed 3    Minutes 15    METs 4.3      REL-XR   Level 3    Speed 50    Minutes 15    METs 4.3      T5 Nustep   Level 3    SPM 80    Minutes 15    METs 4.3      Prescription Details   Frequency (times per week) 3    Duration Progress to 30 minutes of continuous aerobic without signs/symptoms of physical distress      Intensity   THRR 40-80% of Max Heartrate 111-153    Ratings of Perceived Exertion 11-13    Perceived Dyspnea 0-4      Progression   Progression Continue to progress workloads to maintain intensity without signs/symptoms of physical distress.      Resistance Training   Training Prescription Yes    Weight 4    Reps 10-15             Perform Capillary Blood Glucose checks as needed.  Exercise Prescription Changes:   Exercise Prescription Changes     Row Name 06/13/23 1500             Response to Exercise   Blood Pressure (Admit) 112/62       Blood Pressure (Exercise) 146/70       Blood Pressure (Exit) 120/70       Heart Rate (Admit) 70 bpm       Heart Rate (Exercise) 88 bpm       Heart Rate (Exit) 71 bpm       Oxygen Saturation (Admit) 99 %       Oxygen Saturation (Exercise) 98 %       Oxygen Saturation (Exit) 98 %       Rating of Perceived Exertion (Exercise) 9       Perceived Dyspnea (Exercise) 0       Symptoms none       Comments 6 MWT results                Exercise Comments:    Exercise Comments     Row Name 06/15/23 1723           Exercise Comments First full day of exercise!  Patient was oriented to gym and equipment including functions, settings, policies, and procedures.  Patient's individual exercise prescription and treatment plan were reviewed.  All starting workloads were established based on the results of the 6 minute walk test done at initial orientation visit.  The plan for exercise progression was also introduced and progression will be customized based on patient's performance and goals.                Exercise Goals and Review:   Exercise Goals     Row Name 06/13/23 1531             Exercise Goals   Increase Physical Activity Yes       Intervention Provide advice, education, support and counseling about physical activity/exercise needs.;Develop an individualized exercise prescription for aerobic and resistive training based on initial evaluation findings, risk stratification, comorbidities and participant's personal goals.       Expected Outcomes Short Term: Attend rehab on a regular basis to increase amount of physical activity.;Long Term:  Add in home exercise to make exercise part of routine and to increase amount of physical activity.;Long Term: Exercising regularly at least 3-5 days a week.       Increase Strength and Stamina Yes       Intervention Provide advice, education, support and counseling about physical activity/exercise needs.;Develop an individualized exercise prescription for aerobic and resistive training based on initial evaluation findings, risk stratification, comorbidities and participant's personal goals.       Expected Outcomes Short Term: Increase workloads from initial exercise prescription for resistance, speed, and METs.;Short Term: Perform resistance training exercises routinely during rehab and add in resistance training at home;Long Term: Improve cardiorespiratory fitness, muscular endurance and strength as  measured by increased METs and functional capacity ( )       Able to understand and use rate of perceived exertion (RPE) scale Yes       Intervention Provide education and explanation on how to use RPE scale       Expected Outcomes Short Term: Able to use RPE daily in rehab to express subjective intensity level;Long Term:  Able to use RPE to guide intensity level when exercising independently       Able to understand and use Dyspnea scale Yes       Intervention Provide education and explanation on how to use Dyspnea scale       Expected Outcomes Short Term: Able to use Dyspnea scale daily in rehab to express subjective sense of shortness of breath during exertion;Long Term: Able to use Dyspnea scale to guide intensity level when exercising independently       Knowledge and understanding of Target Heart Rate Range (THRR) Yes       Intervention Provide education and explanation of THRR including how the numbers were predicted and where they are located for reference       Expected Outcomes Short Term: Able to state/look up THRR;Long Term: Able to use THRR to govern intensity when exercising independently;Short Term: Able to use daily as guideline for intensity in rehab       Able to check pulse independently Yes       Intervention Provide education and demonstration on how to check pulse in carotid and radial arteries.;Review the importance of being able to check your own pulse for safety during independent exercise       Expected Outcomes Short Term: Able to explain why pulse checking is important during independent exercise;Long Term: Able to check pulse independently and accurately       Understanding of Exercise Prescription Yes       Intervention Provide education, explanation, and written materials on patient's individual exercise prescription       Expected Outcomes Short Term: Able to explain program exercise prescription;Long Term: Able to explain home exercise prescription to exercise  independently                Exercise Goals Re-Evaluation :  Exercise Goals Re-Evaluation     Row Name 06/15/23 1724             Exercise Goal Re-Evaluation   Exercise Goals Review Increase Physical Activity;Able to understand and use rate of perceived exertion (RPE) scale;Knowledge and understanding of Target Heart Rate Range (THRR);Understanding of Exercise Prescription;Increase Strength and Stamina;Able to check pulse independently       Comments Reviewed RPE and dyspnea scale, THR and program prescription with pt today.  Pt voiced understanding and was given a copy of goals to take home.  Expected Outcomes Short: Use RPE daily to regulate intensity.  Long: Follow program prescription in THR.                Discharge Exercise Prescription (Final Exercise Prescription Changes):  Exercise Prescription Changes - 06/13/23 1500       Response to Exercise   Blood Pressure (Admit) 112/62    Blood Pressure (Exercise) 146/70    Blood Pressure (Exit) 120/70    Heart Rate (Admit) 70 bpm    Heart Rate (Exercise) 88 bpm    Heart Rate (Exit) 71 bpm    Oxygen Saturation (Admit) 99 %    Oxygen Saturation (Exercise) 98 %    Oxygen Saturation (Exit) 98 %    Rating of Perceived Exertion (Exercise) 9    Perceived Dyspnea (Exercise) 0    Symptoms none    Comments 6 MWT results             Nutrition:  Target Goals: Understanding of nutrition guidelines, daily intake of sodium 1500mg , cholesterol 200mg , calories 30% from fat and 7% or less from saturated fats, daily to have 5 or more servings of fruits and vegetables.  Education: All About Nutrition: -Group instruction provided by verbal, written material, interactive activities, discussions, models, and posters to present general guidelines for heart healthy nutrition including fat, fiber, MyPlate, the role of sodium in heart healthy nutrition, utilization of the nutrition label, and utilization of this knowledge for meal  planning. Follow up email sent as well. Written material given at graduation. Flowsheet Row Cardiac Rehab from 06/13/2023 in [facility] Cardiac and Pulmonary Rehab  Education need identified 06/13/23       Biometrics:  Pre Biometrics - 06/13/23 1532       Pre Biometrics   Height 5\' 10"  (1.778 m)    Weight 179 lb 3.2 oz (81.3 kg)    Waist Circumference 35.5 inches    Hip Circumference 40.5 inches    Waist to Hip Ratio 0.88 %    BMI (Calculated) 25.71    Single Leg Stand 30 seconds              Nutrition Therapy Plan and Nutrition Goals:  Nutrition Therapy & Goals - 06/13/23 1541       Nutrition Therapy   Diet Cardiac, Low Na    Protein (specify units) 90    Fiber 30 grams    Whole Grain Foods 3 servings    Saturated Fats 15 max. grams    Fruits and Vegetables 5 servings/day    Sodium 2 grams      Personal Nutrition Goals   Nutrition Goal Pair a protein or healthy fat with a complex carb    Personal Goal #2 Continue to limit fried foods and fatty meats.    Personal Goal #3 Continue to watch sodium and saturated fat    Comments Patient drinking 48oz of water consistently. Has made a lot of changes to cut back on sodium, processed foods and sugar. Educated on types of fats, sources, and how to read a label. Reviewed 24hr food recall, spoke on ways to meet nutritional needs when appetite is down. Encouraged more balanced plates with a protein or healthy fat paired with a carb. Provided handout with examples and built out several meals and snacks with foods he likes and will eat focusing on reducing saturated fat and pairing proteins and carbs together.      Intervention Plan   Intervention Prescribe, educate and counsel regarding individualized specific dietary  modifications aiming towards targeted core components such as weight, hypertension, lipid management, diabetes, heart failure and other comorbidities.;Nutrition handout(s) given to patient.    Expected Outcomes Short Term  Goal: Understand basic principles of dietary content, such as calories, fat, sodium, cholesterol and nutrients.;Short Term Goal: A plan has been developed with personal nutrition goals set during dietitian appointment.;Long Term Goal: Adherence to prescribed nutrition plan.             Nutrition Assessments:  MEDIFICTS Score Key: >=70 Need to make dietary changes  40-70 Heart Healthy Diet <= 40 Therapeutic Level Cholesterol Diet  Flowsheet Row Cardiac Rehab from 06/15/2023 in [facility] Cardiac and Pulmonary Rehab  Picture Your Plate Total Score on Admission 63      Picture Your Plate Scores: <40 Unhealthy dietary pattern with much room for improvement. 41-50 Dietary pattern unlikely to meet recommendations for good health and room for improvement. 51-60 More healthful dietary pattern, with some room for improvement.  >60 Healthy dietary pattern, although there may be some specific behaviors that could be improved.    Nutrition Goals Re-Evaluation:   Nutrition Goals Discharge (Final Nutrition Goals Re-Evaluation):   Psychosocial: Target Goals: Acknowledge presence or absence of significant depression and/or stress, maximize coping skills, provide positive support system. Participant is able to verbalize types and ability to use techniques and skills needed for reducing stress and depression.   Education: Stress, Anxiety, and Depression - Group verbal and visual presentation to define topics covered.  Reviews how body is impacted by stress, anxiety, and depression.  Also discusses healthy ways to reduce stress and to treat/manage anxiety and depression.  Written material given at graduation.   Education: Sleep Hygiene -Provides group verbal and written instruction about how sleep can affect your health.  Define sleep hygiene, discuss sleep cycles and impact of sleep habits. Review good sleep hygiene tips.    Initial Review & Psychosocial Screening:  Initial Psych Review &  Screening - 06/08/23 1422       Initial Review   Current issues with Current Stress Concerns    Source of Stress Concerns Financial      Family Dynamics   Good Support System? Yes   family and girlfriend     Barriers   Psychosocial barriers to participate in program There are no identifiable barriers or psychosocial needs.;The patient should benefit from training in stress management and relaxation.      Screening Interventions   Interventions Encouraged to exercise;Provide feedback about the scores to participant;To provide support and resources with identified psychosocial needs    Expected Outcomes Short Term goal: Utilizing psychosocial counselor, staff and physician to assist with identification of specific Stressors or current issues interfering with healing process. Setting desired goal for each stressor or current issue identified.;Long Term Goal: Stressors or current issues are controlled or eliminated.;Short Term goal: Identification and review with participant of any Quality of Life or Depression concerns found by scoring the questionnaire.;Long Term goal: The participant improves quality of Life and PHQ9 Scores as seen by post scores and/or verbalization of changes             Quality of Life Scores:   Quality of Life - 06/13/23 1533       Quality of Life   Select Quality of Life      Quality of Life Scores   Health/Function Pre 13.23 %    Socioeconomic Pre 16.19 %    Psych/Spiritual Pre 15.43 %    Family Pre 24.8 %  GLOBAL Pre 16 %            Scores of 19 and below usually indicate a poorer quality of life in these areas.  A difference of  2-3 points is a clinically meaningful difference.  A difference of 2-3 points in the total score of the Quality of Life Index has been associated with significant improvement in overall quality of life, self-image, physical symptoms, and general health in studies assessing change in quality of life.  PHQ-9: Review  Flowsheet       06/13/2023 09/25/2015 05/12/2015  Depression screen PHQ 2/9  Decreased Interest 1 3 2   Down, Depressed, Hopeless 1 3 0  PHQ - 2 Score 2 6 2   Altered sleeping 1 3 1   Tired, decreased energy 1 3 3   Change in appetite 0 0 1  Feeling bad or failure about yourself  0 1 3  Trouble concentrating 0 0 0  Moving slowly or fidgety/restless 0 0 0  Suicidal thoughts 0 0 0  PHQ-9 Score 4 13 10   Difficult doing work/chores Somewhat difficult Somewhat difficult Not difficult at all    Details           Interpretation of Total Score  Total Score Depression Severity:  1-4 = Minimal depression, 5-9 = Mild depression, 10-14 = Moderate depression, 15-19 = Moderately severe depression, 20-27 = Severe depression   Psychosocial Evaluation and Intervention:  Psychosocial Evaluation - 06/08/23 1437       Psychosocial Evaluation & Interventions   Interventions Encouraged to exercise with the program and follow exercise prescription;Stress management education;Relaxation education    Comments Joshua 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 He said this was a wake up call and has been making big lifestyle changes. He has his [employer], so he is on hold from working until he starts the program. He then will have to do a physical and undergo a stress test to be able to do his job fully. He states this has been a stressor being off of work since he supports himself. His family and girlfriend have been helpful in motivating him, but he is ready to get back to work and his normal routine. He has made a lot of diet changes and he has cut his smoking from 2 packs a day to about 5 cigarettes. He knows he needs to quit and is trying to. He has a history of anxiety and depression which he states is stable at this time. His biggest concern in getting back to work at full capacity and learning how to live a heart healthy lifestyle    Expected Outcomes Short: attend cardiac rehab for education and  exercise. Long; Develop and maintain positive self care habits    Continue Psychosocial Services  Follow up required by staff             Psychosocial Re-Evaluation:   Psychosocial Discharge (Final Psychosocial Re-Evaluation):   Vocational Rehabilitation: Provide vocational rehab assistance to qualifying candidates.   Vocational Rehab Evaluation & Intervention:  Vocational Rehab - 06/13/23 1534       Initial Vocational Rehab Evaluation & Intervention   Assessment shows need for Vocational Rehabilitation No             Education: Education Goals: Education classes will be provided on a variety of topics geared toward better understanding of heart health and risk factor modification. Participant will state understanding/return demonstration of topics presented as noted by education test scores.  Learning Barriers/Preferences:  Learning Barriers/Preferences - 06/08/23 1421       Learning Barriers/Preferences   Learning Barriers None    Learning Preferences None             General Cardiac Education Topics:  AED/CPR: - Group verbal and written instruction with the use of models to demonstrate the basic use of the AED with the basic ABC's of resuscitation.   Anatomy and Cardiac Procedures: - Group verbal and visual presentation and models provide information about basic cardiac anatomy and function. Reviews the testing methods done to diagnose heart disease and the outcomes of the test results. Describes the treatment choices: Medical Management, Angioplasty, or Coronary Bypass Surgery for treating various heart conditions including Myocardial Infarction, Angina, Valve Disease, and Cardiac Arrhythmias.  Written material given at graduation. Flowsheet Row Cardiac Rehab from 06/13/2023 in [facility] Cardiac and Pulmonary Rehab  Education need identified 06/13/23       Medication Safety: - Group verbal and visual instruction to review commonly prescribed medications  for heart and lung disease. Reviews the medication, class of the drug, and side effects. Includes the steps to properly store meds and maintain the prescription regimen.  Written material given at graduation.   Intimacy: - Group verbal instruction through game format to discuss how heart and lung disease can affect sexual intimacy. Written material given at graduation..   Know Your Numbers and Heart Failure: - Group verbal and visual instruction to discuss disease risk factors for cardiac and pulmonary disease and treatment options.  Reviews associated critical values for Overweight/Obesity, Hypertension, Cholesterol, and Diabetes.  Discusses basics of heart failure: signs/symptoms and treatments.  Introduces Heart Failure Zone chart for action plan for heart failure.  Written material given at graduation. Flowsheet Row Cardiac Rehab from 06/13/2023 in [facility] Cardiac and Pulmonary Rehab  Education need identified 06/13/23       Infection Prevention: - Provides verbal and written material to individual with discussion of infection control including proper hand washing and proper equipment cleaning during exercise session. Flowsheet Row Cardiac Rehab from 06/13/2023 in [facility] Cardiac and Pulmonary Rehab  Date 06/13/23  Educator [facility]  Instruction Review Code 1- Verbalizes Understanding       Falls Prevention: - Provides verbal and written material to individual with discussion of falls prevention and safety. Flowsheet Row Cardiac Rehab from 06/13/2023 in [facility] Cardiac and Pulmonary Rehab  Date 06/13/23  Educator [facility]  Instruction Review Code 1- Verbalizes Understanding       Other: -Provides group and verbal instruction on various topics (see comments)   Knowledge Questionnaire Score:  Knowledge Questionnaire Score - 06/13/23 1534       Knowledge Questionnaire Score   Pre Score 22/26             Core Components/Risk Factors/Patient Goals at Admission:  Personal Goals and Risk  Factors at Admission - 06/13/23 1526       Core Components/Risk Factors/Patient Goals on Admission    Weight Management Yes    Intervention Weight Management: Develop a combined nutrition and exercise program designed to reach desired caloric intake, while maintaining appropriate intake of nutrient and fiber, sodium and fats, and appropriate energy expenditure required for the weight goal.;Weight Management: Provide education and appropriate resources to help participant work on and attain dietary goals.;Weight Management/Obesity: Establish reasonable short term and long term weight goals.    Admit Weight 179 lb 3.2 oz (81.3 kg)    Goal Weight: Short Term 179 lb (81.2 kg)  Goal Weight: Long Term 179 lb (81.2 kg)    Expected Outcomes Short Term: Continue to assess and modify interventions until short term weight is achieved;Long Term: Adherence to nutrition and physical activity/exercise program aimed toward attainment of established weight goal;Weight Maintenance: Understanding of the daily nutrition guidelines, which includes 25-35% calories from fat, 7% or less cal from saturated fats, less than 200mg  cholesterol, less than 1.5gm of sodium, & 5 or more servings of fruits and vegetables daily;Understanding recommendations for meals to include 15-35% energy as protein, 25-35% energy from fat, 35-60% energy from carbohydrates, less than 200mg  of dietary cholesterol, 20-35 gm of total fiber daily;Understanding of distribution of calorie intake throughout the day with the consumption of 4-5 meals/snacks    Tobacco Cessation Yes    Number of packs per day 1/2    Intervention Assist the participant in steps to quit. Provide individualized education and counseling about committing to Tobacco Cessation, relapse prevention, and pharmacological support that can be provided by physician.;Education officer, environmental, assist with locating and accessing local/national Quit Smoking programs, and support quit date  choice.    Expected Outcomes Short Term: Will demonstrate readiness to quit, by selecting a quit date.;Short Term: Will quit all tobacco product use, adhering to prevention of relapse plan.;Long Term: Complete abstinence from all tobacco products for at least 12 months from quit date.    Intervention Provide education and support for participant on nutrition & aerobic/resistive exercise along with prescribed medications to achieve LDL 70mg , HDL >40mg .    Expected Outcomes Short Term: Participant states understanding of desired cholesterol values and is compliant with medications prescribed. Participant is following exercise prescription and nutrition guidelines.;Long Term: Cholesterol controlled with medications as prescribed, with individualized exercise RX and with personalized nutrition plan. Value goals: LDL < 70mg , HDL > 40 mg.             Education:Diabetes - Individual verbal and written instruction to review signs/symptoms of diabetes, desired ranges of glucose level fasting, after meals and with exercise. Acknowledge that pre and post exercise glucose checks will be done for 3 sessions at entry of program.   Core Components/Risk Factors/Patient Goals Review:   Goals and Risk Factor Review     Row Name 06/13/23 1526             Core Components/Risk Factors/Patient Goals Review   Personal Goals Review Tobacco Cessation       Review Joshua Leblanc is a current tobacco user. Intervention for tobacco cessation was provided at the initial medical review. He was asked about readiness to quit and reported he has cut back to a half pack a day and is considering the steps he needs to take to quit smoking . Patient was advised and educated about tobacco cessation using combination therapy, tobacco cessation classes, quit line, and quit smoking apps. Patient demonstrated understanding of this material. Staff will continue to provide encouragement and follow up with the patient throughout the program.                 Core Components/Risk Factors/Patient Goals at Discharge (Final Review):   Goals and Risk Factor Review - 06/13/23 1526       Core Components/Risk Factors/Patient Goals Review   Personal Goals Review Tobacco Cessation    Review Joshua Leblanc is a current tobacco user. Intervention for tobacco cessation was provided at the initial medical review. He was asked about readiness to quit and reported he has cut back to a half pack a day and  is considering the steps he needs to take to quit smoking . Patient was advised and educated about tobacco cessation using combination therapy, tobacco cessation classes, quit line, and quit smoking apps. Patient demonstrated understanding of this material. Staff will continue to provide encouragement and follow up with the patient throughout the program.             ITP Comments:  ITP Comments     Row Name 06/08/23 1429 06/13/23 1523 06/15/23 1723 06/29/23 1146     ITP Comments Initial phone call completed. Diagnosis can be found in [facility] 10/12. EP Orientation scheduled for Monday 11/4 1:30.   Joshua Leblanc is a current tobacco user. Intervention for tobacco cessation was provided at the initial medical review. He was asked about readiness to quit and reported that he is ready to start trying. He has reduced his smoking from 2 packs to 5 cigs a day . Patient was advised and educated about tobacco cessation using combination therapy, tobacco cessation classes, quit line, and quit smoking apps. Patient demonstrated understanding of this material. Staff will continue to provide encouragement and follow up with the patient throughout the program. Completed and gym orientation. Initial ITP created and sent for review to Dr. Bethann Punches, Medical Director. Joshua Leblanc is a current tobacco user. Intervention for tobacco cessation was provided at the initial medical review. He was asked about readiness to quit and reported he has cut back to a half pack a day and is  considering the steps he needs to take to quit smoking . Patient was advised and educated about tobacco cessation using combination therapy, tobacco cessation classes, quit line, and quit smoking apps. Patient demonstrated understanding of this material. Staff will continue to provide encouragement and follow up with the patient throughout the program. First full day of exercise!  Patient was oriented to gym and equipment including functions, settings, policies, and procedures.  Patient's individual exercise prescription and treatment plan were reviewed.  All starting workloads were established based on the results of the 6 minute walk test done at initial orientation visit.  The plan for exercise progression was also introduced and progression will be customized based on patient's performance and goals. 30 Day review completed. Medical Director ITP review done, changes made as directed, and signed approval by Medical Director.    new to program             Comments:

## 2023-06-29 NOTE — Progress Notes (Signed)
Daily Session Note  Patient Details  Name: Joshua Leblanc. MRN: [id number] Date of Birth: 1975/08/25 Referring Provider:   Flowsheet Row Cardiac Rehab from 06/13/2023 in [facility] Cardiac and Pulmonary Rehab  Referring Provider Rita Ohara       Encounter Date: 06/29/2023  Check In:  Session Check In - 06/29/23 1715       Check-In   Supervising physician immediately available to respond to emergencies See telemetry face sheet for immediately available ER MD    Location [facility]-Cardiac & Pulmonary Rehab    Staff Present Susann Givens, RN BSN;Joseph Roseland, RCP,RRT,BSRT;Kelly [place], [place], ACSM CEP, Exercise Physiologist    Virtual Visit No    Medication changes reported     No    Fall or balance concerns reported    No    Tobacco Cessation Use Decreased    Current number of cigarettes/nicotine per day     10    Warm-up and Cool-down Performed on first and last piece of equipment    Resistance Training Performed Yes    VAD Patient? No    PAD/SET Patient? No      Pain Assessment   Currently in Pain? No/denies                Social History   Tobacco Use  Smoking Status Every Day   Current packs/day: 1.00   Average packs/day: 1 pack/day for 15.0 years (15.0 ttl pk-yrs)   Types: Cigarettes  Smokeless Tobacco Never    Goals Met:  Independence with exercise equipment Exercise tolerated well No report of concerns or symptoms today Strength training completed today  Goals Unmet:  Not Applicable  Comments: Pt able to follow exercise prescription today without complaint.  Will continue to monitor for progression.    Dr. Bethann Punches is Medical Director for [facility] Cardiac Rehabilitation.  Dr. Vida Rigger is Medical Director for [facility] Pulmonary Rehabilitation.

## 2023-06-30 ENCOUNTER — Encounter: Payer: BC Managed Care – PPO | Admitting: *Deleted

## 2023-06-30 DIAGNOSIS — Z48812 Encounter for surgical aftercare following surgery on the circulatory system: Secondary | ICD-10-CM | POA: Diagnosis not present

## 2023-06-30 DIAGNOSIS — I213 ST elevation (STEMI) myocardial infarction of unspecified site: Secondary | ICD-10-CM

## 2023-06-30 DIAGNOSIS — Z955 Presence of coronary angioplasty implant and graft: Secondary | ICD-10-CM

## 2023-06-30 NOTE — Progress Notes (Signed)
Daily Session Note  Patient Details  Name: Joshua Leblanc. MRN: [id number] Date of Birth: March 09, 1976 Referring Provider:   Flowsheet Row Cardiac Rehab from 06/13/2023 in [facility] Cardiac and Pulmonary Rehab  Referring Provider Rita Ohara       Encounter Date: 06/30/2023  Check In:  Session Check In - 06/30/23 1712       Check-In   Supervising physician immediately available to respond to emergencies See telemetry face sheet for immediately available ER MD    Location [facility]-Cardiac & Pulmonary Rehab    Staff Present Susann Givens, RN BSN;Joseph Hood, RCP,RRT,BSRT;Maxon Seymour BS, , Exercise Physiologist    Virtual Visit No    Medication changes reported     No    Fall or balance concerns reported    No    Tobacco Cessation No Change    Current number of cigarettes/nicotine per day     10    Warm-up and Cool-down Performed on first and last piece of equipment    Resistance Training Performed Yes    VAD Patient? No    PAD/SET Patient? No      Pain Assessment   Currently in Pain? No/denies                Social History   Tobacco Use  Smoking Status Every Day   Current packs/day: 1.00   Average packs/day: 1 pack/day for 15.0 years (15.0 ttl pk-yrs)   Types: Cigarettes  Smokeless Tobacco Never    Goals Met:  Independence with exercise equipment Exercise tolerated well No report of concerns or symptoms today Strength training completed today  Goals Unmet:  Not Applicable  Comments: Pt able to follow exercise prescription today without complaint.  Will continue to monitor for progression.    Dr. Bethann Punches is Medical Director for [facility] Cardiac Rehabilitation.  Dr. Vida Rigger is Medical Director for [facility] Pulmonary Rehabilitation.

## 2023-07-04 ENCOUNTER — Ambulatory Visit: Payer: Self-pay

## 2023-07-06 ENCOUNTER — Encounter: Payer: BC Managed Care – PPO | Admitting: *Deleted

## 2023-07-06 DIAGNOSIS — Z48812 Encounter for surgical aftercare following surgery on the circulatory system: Secondary | ICD-10-CM | POA: Diagnosis not present

## 2023-07-06 DIAGNOSIS — I213 ST elevation (STEMI) myocardial infarction of unspecified site: Secondary | ICD-10-CM

## 2023-07-06 DIAGNOSIS — Z955 Presence of coronary angioplasty implant and graft: Secondary | ICD-10-CM

## 2023-07-06 NOTE — Progress Notes (Signed)
Daily Session Note  Patient Details  Name: Joshua Leblanc. MRN: [id number] Date of Birth: 09-28-1975 Referring Provider:   Flowsheet Row Cardiac Rehab from 06/13/2023 in [facility] Cardiac and Pulmonary Rehab  Referring Provider Rita Ohara       Encounter Date: 07/06/2023  Check In:  Session Check In - 07/06/23 1714       Check-In   Supervising physician immediately available to respond to emergencies See telemetry face sheet for immediately available ER MD    Location [facility]-Cardiac & Pulmonary Rehab    Staff Present Susann Givens, RN BSN;Kristen Coble, RN,BC,MSN;Kelly Pink, BS, ACSM CEP, Exercise Physiologist    Virtual Visit No    Medication changes reported     No    Fall or balance concerns reported    No    Tobacco Cessation No Change    Current number of cigarettes/nicotine per day     10    Warm-up and Cool-down Performed on first and last piece of equipment    Resistance Training Performed Yes    VAD Patient? No    PAD/SET Patient? No      Pain Assessment   Currently in Pain? No/denies                Social History   Tobacco Use  Smoking Status Every Day   Current packs/day: 1.00   Average packs/day: 1 pack/day for 15.0 years (15.0 ttl pk-yrs)   Types: Cigarettes  Smokeless Tobacco Never    Goals Met:  Independence with exercise equipment Exercise tolerated well No report of concerns or symptoms today Strength training completed today  Goals Unmet:  Not Applicable  Comments: Pt able to follow exercise prescription today without complaint.  Will continue to monitor for progression.  Reviewed home exercise with pt today.  Pt plans to walk and use a stationary bike for exercise.  Reviewed THR, pulse, RPE, sign and symptoms, pulse oximetery and when to call 911 or MD.  Also discussed weather considerations and indoor options.  Pt voiced understanding.     Dr. Bethann Punches is Medical Director for [facility] Cardiac Rehabilitation.   Dr. Vida Rigger is Medical Director for [facility] Pulmonary Rehabilitation.

## 2023-07-10 ENCOUNTER — Other Ambulatory Visit: Payer: Self-pay

## 2023-07-10 ENCOUNTER — Emergency Department: Payer: BC Managed Care – PPO

## 2023-07-10 ENCOUNTER — Emergency Department
Admission: EM | Admit: 2023-07-10 | Discharge: 2023-07-10 | Disposition: A | Discharge status: Home / Self Care | Payer: BC Managed Care – PPO | Attending: Emergency Medicine | Admitting: Emergency Medicine

## 2023-07-10 DIAGNOSIS — R0601 Orthopnea: Secondary | ICD-10-CM | POA: Insufficient documentation

## 2023-07-10 DIAGNOSIS — R0789 Other chest pain: Secondary | ICD-10-CM | POA: Diagnosis not present

## 2023-07-10 DIAGNOSIS — R5381 Other malaise: Secondary | ICD-10-CM | POA: Insufficient documentation

## 2023-07-10 DIAGNOSIS — R600 Localized edema: Secondary | ICD-10-CM | POA: Diagnosis not present

## 2023-07-10 DIAGNOSIS — Z20822 Contact with and (suspected) exposure to covid-19: Secondary | ICD-10-CM | POA: Diagnosis not present

## 2023-07-10 DIAGNOSIS — R0602 Shortness of breath: Secondary | ICD-10-CM | POA: Diagnosis present

## 2023-07-10 LAB — COMPREHENSIVE METABOLIC PANEL
ALT: 28 U/L (ref 0–44)
AST: 29 U/L (ref 15–41)
Albumin: 3.5 g/dL (ref 3.5–5.0)
Alkaline Phosphatase: 85 U/L (ref 38–126)
Anion gap: 9 (ref 5–15)
BUN: 9 mg/dL (ref 6–20)
CO2: 23 mmol/L (ref 22–32)
Calcium: 8.9 mg/dL (ref 8.9–10.3)
Chloride: 104 mmol/L (ref 98–111)
Creatinine, Ser: 0.93 mg/dL (ref 0.61–1.24)
GFR, Estimated: >60 mL/min (ref >60)
Glucose, Bld: 99 mg/dL (ref 70–99)
Potassium: 4.1 mmol/L (ref 3.5–5.1)
Sodium: 136 mmol/L (ref 135–145)
Total Bilirubin: 0.8 mg/dL (ref <1.2)
Total Protein: 6.9 g/dL (ref 6.5–8.1)

## 2023-07-10 LAB — CBC WITH DIFFERENTIAL/PLATELET
Abs Immature Granulocytes: 0.01 10*3/uL (ref 0.00–0.07)
Basophils Absolute: 0.1 10*3/uL (ref 0.0–0.1)
Basophils Relative: 1 %
Eosinophils Absolute: 0.3 10*3/uL (ref 0.0–0.5)
Eosinophils Relative: 3 %
HCT: 46.3 % (ref 39.0–52.0)
Hemoglobin: 15.6 g/dL (ref 13.0–17.0)
Immature Granulocytes: 0 %
Lymphocytes Relative: 29 %
Lymphs Abs: 2.9 10*3/uL (ref 0.7–4.0)
MCH: 33.8 pg (ref 26.0–34.0)
MCHC: 33.7 g/dL (ref 30.0–36.0)
MCV: 100.2 fL — ABNORMAL HIGH (ref 80.0–100.0)
Monocytes Absolute: 1 10*3/uL (ref 0.1–1.0)
Monocytes Relative: 10 %
Neutro Abs: 5.7 10*3/uL (ref 1.7–7.7)
Neutrophils Relative %: 57 %
Platelets: 334 10*3/uL (ref 150–400)
RBC: 4.62 MIL/uL (ref 4.22–5.81)
RDW: 12.8 % (ref 11.5–15.5)
WBC: 10 10*3/uL (ref 4.0–10.5)
nRBC: 0 % (ref 0.0–0.2)

## 2023-07-10 LAB — BRAIN NATRIURETIC PEPTIDE: B Natriuretic Peptide: 76 pg/mL (ref 0.0–100.0)

## 2023-07-10 LAB — TROPONIN I (HIGH SENSITIVITY)
Troponin I (High Sensitivity): 12 ng/L (ref <18)
Troponin I (High Sensitivity): 12 ng/L (ref <18)

## 2023-07-10 LAB — MAGNESIUM: Magnesium: 1.9 mg/dL (ref 1.7–2.4)

## 2023-07-10 LAB — SARS CORONAVIRUS 2 BY RT PCR: SARS Coronavirus 2 by RT PCR: NEGATIVE

## 2023-07-10 MED ORDER — METOPROLOL SUCCINATE ER 25 MG PO TB24: 25.0000 mg | ORAL_TABLET | Freq: Once | ORAL | Status: DC

## 2023-07-10 MED ORDER — PRASUGREL HCL 10 MG PO TABS
10.0000 mg | ORAL_TABLET | Freq: Once | ORAL | Status: DC
  Filled 2023-07-10: qty 1

## 2023-07-10 MED ORDER — ATORVASTATIN CALCIUM 20 MG PO TABS
80.0000 mg | ORAL_TABLET | Freq: Once | ORAL | Status: DC
Start: 2023-07-10 — End: 2023-07-10

## 2023-07-10 MED ORDER — ASPIRIN 81 MG PO TBEC
81.0000 mg | DELAYED_RELEASE_TABLET | Freq: Once | ORAL | Status: DC
Start: 2023-07-10 — End: 2023-07-10

## 2023-07-10 MED ORDER — LISINOPRIL 5 MG PO TABS
2.5000 mg | ORAL_TABLET | Freq: Once | ORAL | Status: DC
Start: 2023-07-10 — End: 2023-07-10

## 2023-07-10 MED ORDER — FUROSEMIDE 10 MG/ML IJ SOLN
20.0000 mg | Freq: Once | INTRAMUSCULAR | Status: AC
  Administered 2023-07-10: 20 mg via INTRAVENOUS
  Filled 2023-07-10: qty 4

## 2023-07-10 NOTE — ED Provider Notes (Signed)
[facility] Provider Note    Event Date/Time   First MD Initiated Contact with Patient 07/10/23 0602     (approximate)   History   Chest Pain   HPI  Joshua Leblanc. is a 47 y.o. male who presents to the ED for evaluation of Chest Pain   Review of cardiology clinic visit from 5 weeks ago.  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  CTO RCA.  Currently in cardiac rehab.  Continues to smoke.  Patient presents for evaluation of about 12 hours of shortness of breath, orthopnea, upper respiratory congestion and malaise.    He reports primarily respiratory symptoms more so than chest discomfort.  Reports symptoms started with malaise and congestion yesterday afternoon.  Orthopnea overnight and earlier this morning he awoke with shortness of breath and some mild chest discomfort.  Breathing symptoms improved and sitting upright and he no longer has chest pain.  He was previously on spironolactone but this was discontinued when he followed up in the clinic a few weeks ago due to annoying increased urination.   Physical Exam   Triage Vital Signs: ED Triage Vitals [07/10/23 0602]  Encounter Vitals Group     BP      Systolic BP Percentile      Diastolic BP Percentile      Pulse      Resp      Temp      Temp src      SpO2      Weight      Height      Head Circumference      Peak Flow      Pain Score 7     Pain Loc      Pain Education      Exclude from Growth Chart     Most recent vital signs: Vitals:   07/10/23 0607 07/10/23 0700  BP:  119/73  Pulse:  77  Resp:  17  Temp: 98.2 F (36.8 C)   SpO2:  99%    General: Awake, no distress.  CV:  Good peripheral perfusion.  Resp:  Normal effort.  No wheezing Abd:  No distention.  Soft  MSK:  No deformity noted.  Minimal/trace pitting edema to bilateral lower extremities. Neuro:  No focal deficits appreciated. Other:     ED Results / Procedures /  Treatments   Labs (all labs ordered are listed, but only abnormal results are displayed) Labs Reviewed  CBC WITH DIFFERENTIAL/PLATELET - Abnormal; Notable for the following components:      Result Value   MCV 100.2 (*)    All other components within normal limits  SARS CORONAVIRUS 2 BY RT PCR  COMPREHENSIVE METABOLIC PANEL  BRAIN NATRIURETIC PEPTIDE  MAGNESIUM  TROPONIN I (HIGH SENSITIVITY)    EKG Sinus rhythm with rate of 74 bpm.  Normal axis and intervals.  Nonspecific ST changes and ST depressions to inferior and lateral leads.  Fairly similar morphology as comparison from October.  No STEMI.  RADIOLOGY CXR interpreted by me without evidence of acute cardiopulmonary pathology.  Official radiology report(s): DG Chest Portable 1 View  Result Date: 07/10/2023 CLINICAL DATA:  47 year old male with history of chest pain and shortness of breath. EXAM: PORTABLE CHEST 1 VIEW COMPARISON:  Chest x-ray 05/21/2023. FINDINGS: Lung volumes are normal. No consolidative airspace disease. No pleural effusions. No pneumothorax. No pulmonary nodule or mass noted. Pulmonary vasculature and the cardiomediastinal silhouette  are within normal limits. IMPRESSION: No radiographic evidence of acute cardiopulmonary disease. Electronically Signed   By: Trudie Reed M.D.   On: 07/10/2023 06:16    PROCEDURES and INTERVENTIONS:  .1-3 Lead EKG Interpretation  Performed by: Delton Prairie, MD Authorized by: Delton Prairie, MD     Interpretation: normal     ECG rate:  70   ECG rate assessment: normal     Rhythm: sinus rhythm     Ectopy: none     Conduction: normal     Medications - No data to display   IMPRESSION / MDM / ASSESSMENT AND PLAN / ED COURSE  I reviewed the triage vital signs and the nursing notes.  Differential diagnosis includes, but is not limited to, ACS, PTX, PNA, muscle strain/spasm, PE, dissection, anxiety, pleural effusion, CHF  {Patient presents with symptoms of an acute illness  or injury that is potentially life-threatening.  Patient had a recent STEMI presents with atypical chest discomfort, congestion and shortness of breath.  Has primarily respiratory symptoms and no chest discomfort now.  EKG with nonspecific changes similar to previous.  Initial blood work looks good with CBC, metabolic panel, BNP and troponin all normal.  CXR is clear.  Awaiting viral swabs in the setting of his congestion and malaise, viral syndrome is a possibility.  We will keep him on the monitor, trend troponins and reassess.      FINAL CLINICAL IMPRESSION(S) / ED DIAGNOSES   Final diagnoses:  Shortness of breath  Orthopnea  Other chest pain     Rx / DC Orders   ED Discharge Orders     None        Note:  This document was prepared using Dragon voice recognition software and may include unintentional dictation errors.   Delton Prairie, MD 07/10/23 [phone]

## 2023-07-10 NOTE — Discharge Instructions (Signed)
Please call your cardiology team tomorrow to discuss follow-up visit.  Please return for any worsening shortness of breath or chest pain symptoms.  This could be potential slight volume overload versus a viral infection but otherwise her workup was reassuring.

## 2023-07-10 NOTE — ED Provider Notes (Signed)
  Physical Exam  BP 119/73   Pulse 77   Temp 98.2 F (36.8 C)   Resp 17   Ht 5\' 11"  (1.803 m)   Wt 82.1 kg   SpO2 99%   BMI 25.24 kg/m   Physical Exam I have reviewed the vital signs. General:  Awake, alert, no acute distress. Head:  Normocephalic, Atraumatic. EENT:  PERRL, EOMI, Oral mucosa pink and moist, Neck is supple. Cardiovascular: Regular rate, 2+ distal pulses. Respiratory:  Normal respiratory effort, symmetrical expansion, no distress.   Extremities:  Moving all four extremities through full ROM without pain.   Neuro:  Alert and oriented.  Interacting appropriately.   Skin:  Warm, dry, no rash.   Psych: Appropriate affect.   Procedures  Procedures  ED Course / MDM   Clinical Course as of 07/10/23 0851  [facility] Jul 10, 2023  1610 Troponin I (High Sensitivity): 12 Stable troponins [DW]    Clinical Course User Index [DW] Janith Lima, MD   Medical Decision Making Amount and/or Complexity of Data Reviewed Labs: ordered. Decision-making details documented in ED Course. Radiology: ordered. ECG/medicine tests: ordered.  Risk OTC drugs. Prescription drug management.   Received patient in signout.  47 year old male presenting today for shortness of breath, orthopnea, and upper respiratory congestion with history significant for STEMI on 10/12 with PCI to proximal LAD and mildly reduced EF.  Patient does note continuing to smoke.  EKG with no signs of ischemia.  First troponin negative.  BNP within normal limits and rest of laboratory workup negative.  Patient was signed out pending second troponin.  Plan for potentially home if second troponin is normal.  More suspected to be viral syndrome versus slight CHF.  Reassessed patient.  Still denies any chest pain symptoms throughout time in the ED.  Intermittent congestion and shortness of breath.  Second troponin negative.  Although BNP is normal, given recent reduced EF, will give one-time dose of Lasix here in the ED  for diuresis and discharged with cardiology follow-up.  Given strict return precautions and patient agreeable with plan.     Janith Lima, MD 07/10/23 [phone]

## 2023-07-10 NOTE — ED Triage Notes (Signed)
Pt c/o chest tightness, shortness of breath and jaw pain that woke him from his sleep this morning. Pt reports hx of MI in October this year.

## 2023-07-10 NOTE — ED Notes (Signed)
CCMD contacted by this RN to place pt on cardiac monitoring

## 2023-07-10 NOTE — ED Notes (Signed)
Answered call light, pt stated he needs a warm blanket and oxygen. Ask pt if he was on oxygen at home pt stated no. Provided warm blanket and advised pt his pulse ox is 100% for oxygen at room air. Advised pt oxygen is usually not provided when pt has a PO over 90%. RN notified.

## 2023-07-11 ENCOUNTER — Other Ambulatory Visit: Payer: Self-pay

## 2023-07-11 ENCOUNTER — Encounter: Payer: BC Managed Care – PPO | Attending: Cardiology | Admitting: *Deleted

## 2023-07-11 DIAGNOSIS — Z955 Presence of coronary angioplasty implant and graft: Secondary | ICD-10-CM | POA: Insufficient documentation

## 2023-07-11 DIAGNOSIS — Z48812 Encounter for surgical aftercare following surgery on the circulatory system: Secondary | ICD-10-CM | POA: Diagnosis not present

## 2023-07-11 DIAGNOSIS — I252 Old myocardial infarction: Secondary | ICD-10-CM | POA: Diagnosis not present

## 2023-07-11 DIAGNOSIS — I213 ST elevation (STEMI) myocardial infarction of unspecified site: Secondary | ICD-10-CM | POA: Diagnosis present

## 2023-07-11 MED ORDER — ASPIRIN 81 MG PO TBEC
81.0000 mg | DELAYED_RELEASE_TABLET | Freq: Every day | ORAL | 0 refills | Status: AC
  Filled 2023-07-11: qty 60, 60d supply, fill #0

## 2023-07-11 MED ORDER — PRASUGREL HCL 10 MG PO TABS
10.0000 mg | ORAL_TABLET | Freq: Every day | ORAL | 5 refills | Status: AC
  Filled 2023-07-11: qty 30, 30d supply, fill #0

## 2023-07-11 MED ORDER — ATORVASTATIN CALCIUM 80 MG PO TABS
80.0000 mg | ORAL_TABLET | Freq: Every day | ORAL | 5 refills | Status: AC
  Filled 2023-07-11: qty 30, 30d supply, fill #0

## 2023-07-11 MED ORDER — LISINOPRIL 2.5 MG PO TABS
2.5000 mg | ORAL_TABLET | Freq: Every day | ORAL | 5 refills | Status: AC
  Filled 2023-07-11: qty 30, 30d supply, fill #0

## 2023-07-11 MED ORDER — METOPROLOL SUCCINATE ER 25 MG PO TB24
25.0000 mg | ORAL_TABLET | Freq: Every day | ORAL | 5 refills | Status: AC
  Filled 2023-07-11: qty 30, 30d supply, fill #0

## 2023-07-11 NOTE — Progress Notes (Signed)
Daily Session Note  Patient Details  Name: Joshua Leblanc. MRN: [id number] Date of Birth: 1975/11/26 Referring Provider:   Flowsheet Row Cardiac Rehab from 06/13/2023 in [facility] Cardiac and Pulmonary Rehab  Referring Provider Rita Ohara       Encounter Date: 07/11/2023  Check In:  Session Check In - 07/11/23 1718       Check-In   Supervising physician immediately available to respond to emergencies See telemetry face sheet for immediately available ER MD    Location [facility]-Cardiac & Pulmonary Rehab    Staff Present Susann Givens, RN Mabeline Caras, BS, ACSM CEP, Exercise Physiologist;Megan Katrinka Blazing, RN, Eben Burow RN, BSN    Virtual Visit No    Medication changes reported     No    Fall or balance concerns reported    No    Tobacco Cessation No Change    Current number of cigarettes/nicotine per day     10    Warm-up and Cool-down Performed on first and last piece of equipment    Resistance Training Performed Yes    VAD Patient? No    PAD/SET Patient? No      Pain Assessment   Currently in Pain? No/denies                Social History   Tobacco Use  Smoking Status Every Day   Current packs/day: 1.00   Average packs/day: 1 pack/day for 15.0 years (15.0 ttl pk-yrs)   Types: Cigarettes  Smokeless Tobacco Never    Goals Met:  Independence with exercise equipment Exercise tolerated well No report of concerns or symptoms today Strength training completed today  Goals Unmet:  Not Applicable  Comments: Pt able to follow exercise prescription today without complaint.  Will continue to monitor for progression.    Dr. Bethann Punches is Medical Director for [facility] Cardiac Rehabilitation.  Dr. Vida Rigger is Medical Director for [facility] Pulmonary Rehabilitation.

## 2023-07-13 ENCOUNTER — Encounter: Payer: BC Managed Care – PPO | Admitting: *Deleted

## 2023-07-13 DIAGNOSIS — Z955 Presence of coronary angioplasty implant and graft: Secondary | ICD-10-CM

## 2023-07-13 DIAGNOSIS — I213 ST elevation (STEMI) myocardial infarction of unspecified site: Secondary | ICD-10-CM

## 2023-07-13 NOTE — Progress Notes (Signed)
Daily Session Note  Patient Details  Name: Joshua Leblanc. MRN: [id number] Date of Birth: 1975-11-27 Referring Provider:   Flowsheet Row Cardiac Rehab from 06/13/2023 in [facility] Cardiac and Pulmonary Rehab  Referring Provider Rita Ohara       Encounter Date: 07/13/2023  Check In:  Session Check In - 07/13/23 1722       Check-In   Supervising physician immediately available to respond to emergencies See telemetry face sheet for immediately available ER MD    Location [facility]-Cardiac & Pulmonary Rehab    Staff Present Susann Givens, RN BSN;Joseph [place], RCP,RRT,BSRT;Kelly [place], [place], ACSM CEP, Exercise Physiologist    Virtual Visit No    Medication changes reported     No    Fall or balance concerns reported    No    Tobacco Cessation Use Increase    Current number of cigarettes/nicotine per day     15    Warm-up and Cool-down Performed on first and last piece of equipment    Resistance Training Performed Yes    VAD Patient? No    PAD/SET Patient? No      Pain Assessment   Currently in Pain? No/denies                Social History   Tobacco Use  Smoking Status Every Day   Current packs/day: 1.00   Average packs/day: 1 pack/day for 15.0 years (15.0 ttl pk-yrs)   Types: Cigarettes  Smokeless Tobacco Never    Goals Met:  Independence with exercise equipment Exercise tolerated well No report of concerns or symptoms today Strength training completed today  Goals Unmet:  Not Applicable  Comments: Pt able to follow exercise prescription today without complaint.  Will continue to monitor for progression.    Dr. Bethann Punches is Medical Director for [facility] Cardiac Rehabilitation.  Dr. Vida Rigger is Medical Director for [facility] Pulmonary Rehabilitation.

## 2023-07-14 ENCOUNTER — Encounter: Payer: BC Managed Care – PPO | Admitting: *Deleted

## 2023-07-14 DIAGNOSIS — Z955 Presence of coronary angioplasty implant and graft: Secondary | ICD-10-CM | POA: Diagnosis not present

## 2023-07-14 DIAGNOSIS — I213 ST elevation (STEMI) myocardial infarction of unspecified site: Secondary | ICD-10-CM

## 2023-07-14 NOTE — Progress Notes (Signed)
Daily Session Note  Patient Details  Name: Joshua Leblanc. MRN: [id number] Date of Birth: Jan 21, 1976 Referring Provider:   Flowsheet Row Cardiac Rehab from 06/13/2023 in [facility] Cardiac and Pulmonary Rehab  Referring Provider Rita Ohara       Encounter Date: 07/14/2023  Check In:  Session Check In - 07/14/23 1713       Check-In   Supervising physician immediately available to respond to emergencies See telemetry face sheet for immediately available ER MD    Location [facility]-Cardiac & Pulmonary Rehab    Staff Present Susann Givens, RN BSN;Joseph Hood, RCP,RRT,BSRT;Maxon [place] BS, , Exercise Physiologist;Margaret Best, MS, Exercise Physiologist    Virtual Visit No    Medication changes reported     No    Fall or balance concerns reported    No    Tobacco Cessation No Change    Current number of cigarettes/nicotine per day     15    Warm-up and Cool-down Performed on first and last piece of equipment    Resistance Training Performed Yes    VAD Patient? No    PAD/SET Patient? No      Pain Assessment   Currently in Pain? No/denies                Social History   Tobacco Use  Smoking Status Every Day   Current packs/day: 1.00   Average packs/day: 1 pack/day for 15.0 years (15.0 ttl pk-yrs)   Types: Cigarettes  Smokeless Tobacco Never    Goals Met:  Independence with exercise equipment Exercise tolerated well No report of concerns or symptoms today Strength training completed today  Goals Unmet:  Not Applicable  Comments: Pt able to follow exercise prescription today without complaint.  Will continue to monitor for progression.    Dr. Bethann Punches is Medical Director for [facility] Cardiac Rehabilitation.  Dr. Vida Rigger is Medical Director for [facility] Pulmonary Rehabilitation.

## 2023-07-18 ENCOUNTER — Other Ambulatory Visit: Payer: Self-pay

## 2023-07-20 ENCOUNTER — Encounter: Payer: BC Managed Care – PPO | Admitting: *Deleted

## 2023-07-20 DIAGNOSIS — Z955 Presence of coronary angioplasty implant and graft: Secondary | ICD-10-CM

## 2023-07-20 DIAGNOSIS — I213 ST elevation (STEMI) myocardial infarction of unspecified site: Secondary | ICD-10-CM

## 2023-07-20 NOTE — Progress Notes (Signed)
Daily Session Note  Patient Details  Name: Joshua Leblanc. MRN: [id number] Date of Birth: 04/10/1976 Referring Provider:   Flowsheet Row Cardiac Rehab from 06/13/2023 in [facility] Cardiac and Pulmonary Rehab  Referring Provider Rita Ohara       Encounter Date: 07/20/2023  Check In:  Session Check In - 07/20/23 1718       Check-In   Supervising physician immediately available to respond to emergencies See telemetry face sheet for immediately available ER MD    Location [facility]-Cardiac & Pulmonary Rehab    Staff Present Susann Givens, RN Mabeline Caras, BS, ACSM CEP, Exercise Physiologist;Joseph Reino [place], [place]    Virtual Visit No    Medication changes reported     Yes    Comments started methocarbamol  500 mg and tapering prednisone    Fall or balance concerns reported    No    Tobacco Cessation No Change    Current number of cigarettes/nicotine per day     15    Warm-up and Cool-down Performed on first and last piece of equipment    Resistance Training Performed Yes    VAD Patient? No      Pain Assessment   Currently in Pain? No/denies                Social History   Tobacco Use  Smoking Status Every Day   Current packs/day: 1.00   Average packs/day: 1 pack/day for 15.0 years (15.0 ttl pk-yrs)   Types: Cigarettes  Smokeless Tobacco Never    Goals Met:  Independence with exercise equipment Exercise tolerated well Personal goals reviewed No report of concerns or symptoms today Strength training completed today  Goals Unmet:  Not Applicable  Comments: Pt able to follow exercise prescription today without complaint.  Will continue to monitor for progression.    Dr. Bethann Punches is Medical Director for [facility] Cardiac Rehabilitation.  Dr. Vida Rigger is Medical Director for [facility] Pulmonary Rehabilitation.

## 2023-07-21 ENCOUNTER — Encounter: Payer: BC Managed Care – PPO | Admitting: *Deleted

## 2023-07-21 DIAGNOSIS — I213 ST elevation (STEMI) myocardial infarction of unspecified site: Secondary | ICD-10-CM

## 2023-07-21 DIAGNOSIS — Z955 Presence of coronary angioplasty implant and graft: Secondary | ICD-10-CM | POA: Diagnosis not present

## 2023-07-21 NOTE — Progress Notes (Signed)
Daily Session Note  Patient Details  Name: Joshua Leblanc. MRN: [id number] Date of Birth: 06/03/1976 Referring Provider:   Flowsheet Row Cardiac Rehab from 06/13/2023 in [facility] Cardiac and Pulmonary Rehab  Referring Provider Rita Ohara       Encounter Date: 07/21/2023  Check In:  Session Check In - 07/21/23 1715       Check-In   Supervising physician immediately available to respond to emergencies See telemetry face sheet for immediately available ER MD    Location [facility]-Cardiac & Pulmonary Rehab    Staff Present Susann Givens, RN BSN;Joseph Hood, RCP,RRT,BSRT;Margaret Best, MS, Exercise Physiologist    Virtual Visit No    Medication changes reported     No    Fall or balance concerns reported    No    Tobacco Cessation No Change    Current number of cigarettes/nicotine per day     15    Warm-up and Cool-down Performed on first and last piece of equipment    Resistance Training Performed Yes    VAD Patient? No    PAD/SET Patient? No      Pain Assessment   Currently in Pain? No/denies                Social History   Tobacco Use  Smoking Status Every Day   Current packs/day: 1.00   Average packs/day: 1 pack/day for 15.0 years (15.0 ttl pk-yrs)   Types: Cigarettes  Smokeless Tobacco Never    Goals Met:  Independence with exercise equipment Exercise tolerated well No report of concerns or symptoms today Strength training completed today  Goals Unmet:  Not Applicable  Comments: Pt able to follow exercise prescription today without complaint.  Will continue to monitor for progression.    Dr. Bethann Punches is Medical Director for [facility] Cardiac Rehabilitation.  Dr. Vida Rigger is Medical Director for [facility] Pulmonary Rehabilitation.

## 2023-07-25 ENCOUNTER — Encounter: Payer: BC Managed Care – PPO | Admitting: *Deleted

## 2023-07-25 DIAGNOSIS — Z955 Presence of coronary angioplasty implant and graft: Secondary | ICD-10-CM

## 2023-07-25 DIAGNOSIS — I213 ST elevation (STEMI) myocardial infarction of unspecified site: Secondary | ICD-10-CM

## 2023-07-25 NOTE — Progress Notes (Signed)
Daily Session Note  Patient Details  Name: Joshua Leblanc. MRN: [id number] Date of Birth: August 12, 1975 Referring Provider:   Flowsheet Row Cardiac Rehab from 06/13/2023 in [facility] Cardiac and Pulmonary Rehab  Referring Provider Rita Ohara       Encounter Date: 07/25/2023  Check In:  Session Check In - 07/25/23 1738       Check-In   Supervising physician immediately available to respond to emergencies See telemetry face sheet for immediately available ER MD    Location [facility]-Cardiac & Pulmonary Rehab    Staff Present Elige Ko, [place], BS, ACSM CEP, Exercise Physiologist;Krishon Adkison Katrinka Blazing, RN, ADN    Virtual Visit No    Medication changes reported     No    Fall or balance concerns reported    No    Tobacco Cessation No Change    Warm-up and Cool-down Performed on first and last piece of equipment    Resistance Training Performed Yes    VAD Patient? No    PAD/SET Patient? No      Pain Assessment   Currently in Pain? No/denies                Social History   Tobacco Use  Smoking Status Every Day   Current packs/day: 1.00   Average packs/day: 1 pack/day for 15.0 years (15.0 ttl pk-yrs)   Types: Cigarettes  Smokeless Tobacco Never    Goals Met:  Independence with exercise equipment Exercise tolerated well No report of concerns or symptoms today Strength training completed today  Goals Unmet:  Not Applicable  Comments: Pt able to follow exercise prescription today without complaint.  Will continue to monitor for progression.    Dr. Bethann Punches is Medical Director for [facility] Cardiac Rehabilitation.  Dr. Vida Rigger is Medical Director for [facility] Pulmonary Rehabilitation.

## 2023-07-27 ENCOUNTER — Ambulatory Visit: Payer: Self-pay

## 2023-07-27 ENCOUNTER — Encounter: Payer: Self-pay | Admitting: *Deleted

## 2023-07-27 DIAGNOSIS — I213 ST elevation (STEMI) myocardial infarction of unspecified site: Secondary | ICD-10-CM

## 2023-07-27 DIAGNOSIS — Z955 Presence of coronary angioplasty implant and graft: Secondary | ICD-10-CM

## 2023-07-27 NOTE — Progress Notes (Signed)
Cardiac Individual Treatment Plan  Patient Details  Name: Joshua Leblanc. MRN: [id number] Date of Birth: 1975-12-06 Referring Provider:   Flowsheet Row Cardiac Rehab from 06/13/2023 in [facility] Cardiac and Pulmonary Rehab  Referring Provider Rita Ohara       Initial Encounter Date:  Flowsheet Row Cardiac Rehab from 06/13/2023 in [facility] Cardiac and Pulmonary Rehab  Date 06/13/23       Visit Diagnosis: ST elevation myocardial infarction (STEMI), unspecified artery [facility])  Status post coronary artery stent placement  Patient's Home Medications on Admission:  Current Outpatient Medications:    aspirin EC (ASPIRIN 81) 81 MG tablet, Take 1 tablet (81 mg total) by mouth daily., Disp: 60 tablet, Rfl: 0   aspirin EC 81 MG tablet, Take 1 tablet (81 mg total) by mouth daily. Swallow whole., Disp: 60 tablet, Rfl: 0   atorvastatin (LIPITOR) 80 MG tablet, Take 1 tablet (80 mg total) by mouth daily., Disp: 30 tablet, Rfl: 5   atorvastatin (LIPITOR) 80 MG tablet, Take 1 tablet (80 mg total) by mouth once daily, Disp: 30 tablet, Rfl: 5   atorvastatin (LIPITOR) 80 MG tablet, Take 1 tablet (80 mg total) by mouth daily., Disp: 30 tablet, Rfl: 5   cephALEXin (KEFLEX) 500 MG capsule, Take by mouth., Disp: , Rfl:    diazepam (VALIUM) 5 MG tablet, Take 0.5 to 1 tab every 8 hours as needed for anxiety., Disp: , Rfl:    FLUoxetine (PROZAC) 20 MG capsule, Take by mouth., Disp: , Rfl:    lisinopril (ZESTRIL) 2.5 MG tablet, Take 1 tablet (2.5 mg total) by mouth daily., Disp: 30 tablet, Rfl: 5   lisinopril (ZESTRIL) 2.5 MG tablet, Take 1 tablet (2.5 mg total) by mouth once daily, Disp: 30 tablet, Rfl: 5   lisinopril (ZESTRIL) 2.5 MG tablet, Take 1 tablet (2.5 mg total) by mouth daily., Disp: 30 tablet, Rfl: 5   metoprolol succinate (TOPROL-XL) 25 MG 24 hr tablet, Take 1 tablet (25 mg total) by mouth once daily., Disp: 30 tablet, Rfl: 5   metoprolol succinate (TOPROL-XL) 25 MG 24 hr tablet, Take 1  tablet (25 mg total) by mouth once daily, Disp: 30 tablet, Rfl: 5   metoprolol succinate (TOPROL-XL) 25 MG 24 hr tablet, Take 1 tablet (25 mg total) by mouth daily., Disp: 30 tablet, Rfl: 5   nicotine (NICODERM CQ - DOSED IN MG/24 HOURS) 21 mg/24hr patch, Place 1 patch (21 mg total) onto the skin daily., Disp: , Rfl:    prasugrel (EFFIENT) 10 MG TABS tablet, Take 1 tablet (10 mg total) by mouth once daily., Disp: 30 tablet, Rfl: 5   prasugrel (EFFIENT) 10 MG TABS tablet, Take 1 tablet (10 mg total) by mouth once daily, Disp: 30 tablet, Rfl: 5   prasugrel (EFFIENT) 10 MG TABS tablet, Take 1 tablet (10 mg total) by mouth daily., Disp: 30 tablet, Rfl: 5   spironolactone (ALDACTONE) 25 MG tablet, Take 0.5 tablets (12.5 mg total) by mouth daily., Disp: 30 tablet, Rfl: 0  Past Medical History: Past Medical History:  Diagnosis Date   Anxiety    Depression    Headache    Neck pain     Tobacco Use: Social History   Tobacco Use  Smoking Status Every Day   Current packs/day: 1.00   Average packs/day: 1 pack/day for 15.0 years (15.0 ttl pk-yrs)   Types: Cigarettes  Smokeless Tobacco Never    Labs: Review Flowsheet       Latest Ref Rng & Units  05/21/2023  Labs for ITP Cardiac and Pulmonary Rehab  Cholestrol 0 - 200 mg/dL 846   LDL (calc) 0 - 99 mg/dL 94   HDL-C >96 mg/dL 32   Trlycerides <295 mg/dL 284   Hemoglobin X3K 4.8 - 5.6 % 5.4      Exercise Target Goals: Exercise Program Goal: Individual exercise prescription set using results from initial 6 min walk test and THRR while considering  patient's activity barriers and safety.   Exercise Prescription Goal: Initial exercise prescription builds to 30-45 minutes a day of aerobic activity, 2-3 days per week.  Home exercise guidelines will be given to patient during program as part of exercise prescription that the participant will acknowledge.   Education: Aerobic Exercise: - Group verbal and visual presentation on the components  of exercise prescription. Introduces F.I.T.T principle from ACSM for exercise prescriptions.  Reviews F.I.T.T. principles of aerobic exercise including progression. Written material given at graduation.   Education: Resistance Exercise: - Group verbal and visual presentation on the components of exercise prescription. Introduces F.I.T.T principle from ACSM for exercise prescriptions  Reviews F.I.T.T. principles of resistance exercise including progression. Written material given at graduation.    Education: Exercise & Equipment Safety: - Individual verbal instruction and demonstration of equipment use and safety with use of the equipment. Flowsheet Row Cardiac Rehab from 06/13/2023 in [facility] Cardiac and Pulmonary Rehab  Date 06/13/23  Educator [facility]  Instruction Review Code 1- Verbalizes Understanding       Education: Exercise Physiology & General Exercise Guidelines: - Group verbal and written instruction with models to review the exercise physiology of the cardiovascular system and associated critical values. Provides general exercise guidelines with specific guidelines to those with heart or lung disease.    Education: Flexibility, Balance, Mind/Body Relaxation: - Group verbal and visual presentation with interactive activity on the components of exercise prescription. Introduces F.I.T.T principle from ACSM for exercise prescriptions. Reviews F.I.T.T. principles of flexibility and balance exercise training including progression. Also discusses the mind body connection.  Reviews various relaxation techniques to help reduce and manage stress (i.e. Deep breathing, progressive muscle relaxation, and visualization). Balance handout provided to take home. Written material given at graduation.   Activity Barriers & Risk Stratification:  Activity Barriers & Cardiac Risk Stratification - 06/13/23 1535       Activity Barriers & Cardiac Risk Stratification   Activity Barriers None    Cardiac Risk  Stratification Moderate             6 Minute Walk:  6 Minute Walk     Row Name 06/13/23 1527         6 Minute Walk   Phase Initial     Distance 1280 feet     Walk Time 6 minutes     # of Rest Breaks 0     MPH 2.42     METS 4.3     RPE 9     Perceived Dyspnea  0     VO2 Peak 15.1     Symptoms No     Resting HR 70 bpm     Resting BP 112/62     Resting Oxygen Saturation  99 %     Exercise Oxygen Saturation  during 6 min walk 98 %     Max Ex. HR 88 bpm     Max Ex. BP 146/70     2 Minute Post BP 120/70  Oxygen Initial Assessment:   Oxygen Re-Evaluation:   Oxygen Discharge (Final Oxygen Re-Evaluation):   Initial Exercise Prescription:  Initial Exercise Prescription - 06/13/23 1500       Date of Initial Exercise RX and Referring Provider   Date 06/13/23    Referring Provider Rita Ohara      Oxygen   Maintain Oxygen Saturation 88% or higher      Treadmill   MPH 2.7    Grade 1    Minutes 15    METs 4.3      NuStep   Level 3    SPM 80    Minutes 15    METs 4.3      Elliptical   Level 1    Speed 3    Minutes 15    METs 4.3      REL-XR   Level 3    Speed 50    Minutes 15    METs 4.3      T5 Nustep   Level 3    SPM 80    Minutes 15    METs 4.3      Prescription Details   Frequency (times per week) 3    Duration Progress to 30 minutes of continuous aerobic without signs/symptoms of physical distress      Intensity   THRR 40-80% of Max Heartrate 111-153    Ratings of Perceived Exertion 11-13    Perceived Dyspnea 0-4      Progression   Progression Continue to progress workloads to maintain intensity without signs/symptoms of physical distress.      Resistance Training   Training Prescription Yes    Weight 4    Reps 10-15             Perform Capillary Blood Glucose checks as needed.  Exercise Prescription Changes:   Exercise Prescription Changes     Row Name 06/13/23 1500 06/30/23 1400 07/06/23  1700 07/12/23 1400       Response to Exercise   Blood Pressure (Admit) 112/62 120/60 -- 108/72    Blood Pressure (Exercise) 146/70 150/80 -- 146/70    Blood Pressure (Exit) 120/70 110/70 -- 104/70    Heart Rate (Admit) 70 bpm 69 bpm -- 80 bpm    Heart Rate (Exercise) 88 bpm 122 bpm -- 111 bpm    Heart Rate (Exit) 71 bpm 87 bpm -- 89 bpm    Oxygen Saturation (Admit) 99 % -- -- --    Oxygen Saturation (Exercise) 98 % -- -- --    Oxygen Saturation (Exit) 98 % -- -- --    Rating of Perceived Exertion (Exercise) 9 13 -- 13    Perceived Dyspnea (Exercise) 0 -- -- 0    Symptoms none none -- none    Comments 6 MWT results First full week of exercise -- --    Duration -- Continue with 30 min of aerobic exercise without signs/symptoms of physical distress. -- Progress to 30 minutes of  aerobic without signs/symptoms of physical distress    Intensity -- THRR unchanged THRR unchanged THRR unchanged      Progression   Progression -- Continue to progress workloads to maintain intensity without signs/symptoms of physical distress. Continue to progress workloads to maintain intensity without signs/symptoms of physical distress. Continue to progress workloads to maintain intensity without signs/symptoms of physical distress.    Average METs -- 3.84 3.84 3.75      Resistance Training   Training Prescription -- Yes Yes Yes  Weight -- 4 lb 4 lb 8 lb    Reps -- 10-15 10-15 10-15      Interval Training   Interval Training -- No No No      Treadmill   MPH -- 2.7 2.7 3    Grade -- 1 1 0.5    Minutes -- 15 15 15     METs -- 3.44 3.44 3.5      NuStep   Level -- 5 5 --    Minutes -- 15 15 --    METs -- 4.9 4.9 --      REL-XR   Level -- 5 5 7     Minutes -- 15 15 15     METs -- 4.8 4.8 --      Biostep-RELP   Level -- -- -- 4    Minutes -- -- -- 15    METs -- -- -- 4      Home Exercise Plan   Plans to continue exercise at -- -- Home (comment)  walking and stationary bike Home (comment)   walking and stationary bike    Frequency -- -- Add 2 additional days to program exercise sessions. Add 2 additional days to program exercise sessions.    Initial Home Exercises Provided -- -- 07/06/23 07/06/23      Oxygen   Maintain Oxygen Saturation -- 88% or higher 88% or higher 88% or higher             Exercise Comments:   Exercise Comments     Row Name 06/15/23 1723           Exercise Comments First full day of exercise!  Patient was oriented to gym and equipment including functions, settings, policies, and procedures.  Patient's individual exercise prescription and treatment plan were reviewed.  All starting workloads were established based on the results of the 6 minute walk test done at initial orientation visit.  The plan for exercise progression was also introduced and progression will be customized based on patient's performance and goals.                Exercise Goals and Review:   Exercise Goals     Row Name 06/13/23 1531             Exercise Goals   Increase Physical Activity Yes       Intervention Provide advice, education, support and counseling about physical activity/exercise needs.;Develop an individualized exercise prescription for aerobic and resistive training based on initial evaluation findings, risk stratification, comorbidities and participant's personal goals.       Expected Outcomes Short Term: Attend rehab on a regular basis to increase amount of physical activity.;Long Term: Add in home exercise to make exercise part of routine and to increase amount of physical activity.;Long Term: Exercising regularly at least 3-5 days a week.       Increase Strength and Stamina Yes       Intervention Provide advice, education, support and counseling about physical activity/exercise needs.;Develop an individualized exercise prescription for aerobic and resistive training based on initial evaluation findings, risk stratification, comorbidities and  participant's personal goals.       Expected Outcomes Short Term: Increase workloads from initial exercise prescription for resistance, speed, and METs.;Short Term: Perform resistance training exercises routinely during rehab and add in resistance training at home;Long Term: Improve cardiorespiratory fitness, muscular endurance and strength as measured by increased METs and functional capacity ( )       Able to understand and  use rate of perceived exertion (RPE) scale Yes       Intervention Provide education and explanation on how to use RPE scale       Expected Outcomes Short Term: Able to use RPE daily in rehab to express subjective intensity level;Long Term:  Able to use RPE to guide intensity level when exercising independently       Able to understand and use Dyspnea scale Yes       Intervention Provide education and explanation on how to use Dyspnea scale       Expected Outcomes Short Term: Able to use Dyspnea scale daily in rehab to express subjective sense of shortness of breath during exertion;Long Term: Able to use Dyspnea scale to guide intensity level when exercising independently       Knowledge and understanding of Target Heart Rate Range (THRR) Yes       Intervention Provide education and explanation of THRR including how the numbers were predicted and where they are located for reference       Expected Outcomes Short Term: Able to state/look up THRR;Long Term: Able to use THRR to govern intensity when exercising independently;Short Term: Able to use daily as guideline for intensity in rehab       Able to check pulse independently Yes       Intervention Provide education and demonstration on how to check pulse in carotid and radial arteries.;Review the importance of being able to check your own pulse for safety during independent exercise       Expected Outcomes Short Term: Able to explain why pulse checking is important during independent exercise;Long Term: Able to check pulse  independently and accurately       Understanding of Exercise Prescription Yes       Intervention Provide education, explanation, and written materials on patient's individual exercise prescription       Expected Outcomes Short Term: Able to explain program exercise prescription;Long Term: Able to explain home exercise prescription to exercise independently                Exercise Goals Re-Evaluation :  Exercise Goals Re-Evaluation     Row Name 06/15/23 1724 06/30/23 1429 07/06/23 1733 07/12/23 1456       Exercise Goal Re-Evaluation   Exercise Goals Review Increase Physical Activity;Able to understand and use rate of perceived exertion (RPE) scale;Knowledge and understanding of Target Heart Rate Range (THRR);Understanding of Exercise Prescription;Increase Strength and Stamina;Able to check pulse independently Increase Physical Activity;Increase Strength and Stamina;Understanding of Exercise Prescription Increase Physical Activity;Able to understand and use rate of perceived exertion (RPE) scale;Knowledge and understanding of Target Heart Rate Range (THRR);Understanding of Exercise Prescription;Increase Strength and Stamina;Able to understand and use Dyspnea scale;Able to check pulse independently Increase Physical Activity;Increase Strength and Stamina;Understanding of Exercise Prescription    Comments Reviewed RPE and dyspnea scale, THR and program prescription with pt today.  Pt voiced understanding and was given a copy of goals to take home. Chayse is off to a good start in the program. He did well on the treadmill at a speed of 2.7 mph with an incline of 1%. He also improved to level 5 on both the XR and T4 nustep in just his first week of rehab! We will continue to monitor his progress in the program. Reviewed home exercise with pt today.  Pt plans to walk and use a stationary bike for exercise.  Reviewed THR, pulse, RPE, sign and symptoms, pulse oximetery and when to call  911 or MD.  Also  discussed weather considerations and indoor options.  Pt voiced understanding. Reita Cliche is doing well in rehab. He has been able to increase his handweights to 8lb, and level on the XR to level 7. He was also able to increase his speed on the treadmill from 2.7 mph to 3 mph. We will continue to monitor his progress in the program.    Expected Outcomes Short: Use RPE daily to regulate intensity.  Long: Follow program prescription in THR. Short: Continue to follow current exercise prescription. Long: Continue exercise to improve strength and stamina. Short: buy a stationary bike and add 1-2 days a week of exercise at home on off days of rehab. Long: become independent with exercise routine. Short: Continue to follow current exercise prescription. Long: Continue exercise to improve strength and stamina.             Discharge Exercise Prescription (Final Exercise Prescription Changes):  Exercise Prescription Changes - 07/12/23 1400       Response to Exercise   Blood Pressure (Admit) 108/72    Blood Pressure (Exercise) 146/70    Blood Pressure (Exit) 104/70    Heart Rate (Admit) 80 bpm    Heart Rate (Exercise) 111 bpm    Heart Rate (Exit) 89 bpm    Rating of Perceived Exertion (Exercise) 13    Perceived Dyspnea (Exercise) 0    Symptoms none    Duration Progress to 30 minutes of  aerobic without signs/symptoms of physical distress    Intensity THRR unchanged      Progression   Progression Continue to progress workloads to maintain intensity without signs/symptoms of physical distress.    Average METs 3.75      Resistance Training   Training Prescription Yes    Weight 8 lb    Reps 10-15      Interval Training   Interval Training No      Treadmill   MPH 3    Grade 0.5    Minutes 15    METs 3.5      REL-XR   Level 7    Minutes 15      Biostep-RELP   Level 4    Minutes 15    METs 4      Home Exercise Plan   Plans to continue exercise at Home (comment)   walking and stationary  bike   Frequency Add 2 additional days to program exercise sessions.    Initial Home Exercises Provided 07/06/23      Oxygen   Maintain Oxygen Saturation 88% or higher             Nutrition:  Target Goals: Understanding of nutrition guidelines, daily intake of sodium 1500mg , cholesterol 200mg , calories 30% from fat and 7% or less from saturated fats, daily to have 5 or more servings of fruits and vegetables.  Education: All About Nutrition: -Group instruction provided by verbal, written material, interactive activities, discussions, models, and posters to present general guidelines for heart healthy nutrition including fat, fiber, MyPlate, the role of sodium in heart healthy nutrition, utilization of the nutrition label, and utilization of this knowledge for meal planning. Follow up email sent as well. Written material given at graduation. Flowsheet Row Cardiac Rehab from 06/13/2023 in [facility] Cardiac and Pulmonary Rehab  Education need identified 06/13/23       Biometrics:  Pre Biometrics - 06/13/23 1532       Pre Biometrics   Height 5\' 10"  (1.778 m)  Weight 179 lb 3.2 oz (81.3 kg)    Waist Circumference 35.5 inches    Hip Circumference 40.5 inches    Waist to Hip Ratio 0.88 %    BMI (Calculated) 25.71    Single Leg Stand 30 seconds              Nutrition Therapy Plan and Nutrition Goals:  Nutrition Therapy & Goals - 06/13/23 1541       Nutrition Therapy   Diet Cardiac, Low Na    Protein (specify units) 90    Fiber 30 grams    Whole Grain Foods 3 servings    Saturated Fats 15 max. grams    Fruits and Vegetables 5 servings/day    Sodium 2 grams      Personal Nutrition Goals   Nutrition Goal Pair a protein or healthy fat with a complex carb    Personal Goal #2 Continue to limit fried foods and fatty meats.    Personal Goal #3 Continue to watch sodium and saturated fat    Comments Patient drinking 48oz of water consistently. Has made a lot of changes to cut  back on sodium, processed foods and sugar. Educated on types of fats, sources, and how to read a label. Reviewed 24hr food recall, spoke on ways to meet nutritional needs when appetite is down. Encouraged more balanced plates with a protein or healthy fat paired with a carb. Provided handout with examples and built out several meals and snacks with foods he likes and will eat focusing on reducing saturated fat and pairing proteins and carbs together.      Intervention Plan   Intervention Prescribe, educate and counsel regarding individualized specific dietary modifications aiming towards targeted core components such as weight, hypertension, lipid management, diabetes, heart failure and other comorbidities.;Nutrition handout(s) given to patient.    Expected Outcomes Short Term Goal: Understand basic principles of dietary content, such as calories, fat, sodium, cholesterol and nutrients.;Short Term Goal: A plan has been developed with personal nutrition goals set during dietitian appointment.;Long Term Goal: Adherence to prescribed nutrition plan.             Nutrition Assessments:  MEDIFICTS Score Key: >=70 Need to make dietary changes  40-70 Heart Healthy Diet <= 40 Therapeutic Level Cholesterol Diet  Flowsheet Row Cardiac Rehab from 06/15/2023 in [facility] Cardiac and Pulmonary Rehab  Picture Your Plate Total Score on Admission 63      Picture Your Plate Scores: <98 Unhealthy dietary pattern with much room for improvement. 41-50 Dietary pattern unlikely to meet recommendations for good health and room for improvement. 51-60 More healthful dietary pattern, with some room for improvement.  >60 Healthy dietary pattern, although there may be some specific behaviors that could be improved.    Nutrition Goals Re-Evaluation:  Nutrition Goals Re-Evaluation     Row Name 07/20/23 1732             Goals   Comment Reita Cliche states that he has been mindful of sodium intake. He does not add salt  at the table and has started reading food labels. He reports drinking plenty of fluids. He does not drink sugary drinks but mostly flavored water with low sugar and sodium content. He was for the most part been avoiding fried food.       Expected Outcome Short: continue to read food labels and watch sodium intake. Long: maintain heart healthy diet to control cardiac risk factors.         Personal  Goal #2 Re-Evaluation   Personal Goal #2 Continue to limit fried foods and fatty meats.         Personal Goal #3 Re-Evaluation   Personal Goal #3 Continue to watch sodium and saturated fat                Nutrition Goals Discharge (Final Nutrition Goals Re-Evaluation):  Nutrition Goals Re-Evaluation - 07/20/23 1732       Goals   Comment Reita Cliche states that he has been mindful of sodium intake. He does not add salt at the table and has started reading food labels. He reports drinking plenty of fluids. He does not drink sugary drinks but mostly flavored water with low sugar and sodium content. He was for the most part been avoiding fried food.    Expected Outcome Short: continue to read food labels and watch sodium intake. Long: maintain heart healthy diet to control cardiac risk factors.      Personal Goal #2 Re-Evaluation   Personal Goal #2 Continue to limit fried foods and fatty meats.      Personal Goal #3 Re-Evaluation   Personal Goal #3 Continue to watch sodium and saturated fat             Psychosocial: Target Goals: Acknowledge presence or absence of significant depression and/or stress, maximize coping skills, provide positive support system. Participant is able to verbalize types and ability to use techniques and skills needed for reducing stress and depression.   Education: Stress, Anxiety, and Depression - Group verbal and visual presentation to define topics covered.  Reviews how body is impacted by stress, anxiety, and depression.  Also discusses healthy ways to reduce stress  and to treat/manage anxiety and depression.  Written material given at graduation.   Education: Sleep Hygiene -Provides group verbal and written instruction about how sleep can affect your health.  Define sleep hygiene, discuss sleep cycles and impact of sleep habits. Review good sleep hygiene tips.    Initial Review & Psychosocial Screening:  Initial Psych Review & Screening - 06/08/23 1422       Initial Review   Current issues with Current Stress Concerns    Source of Stress Concerns Financial      Family Dynamics   Good Support System? Yes   family and girlfriend     Barriers   Psychosocial barriers to participate in program There are no identifiable barriers or psychosocial needs.;The patient should benefit from training in stress management and relaxation.      Screening Interventions   Interventions Encouraged to exercise;Provide feedback about the scores to participant;To provide support and resources with identified psychosocial needs    Expected Outcomes Short Term goal: Utilizing psychosocial counselor, staff and physician to assist with identification of specific Stressors or current issues interfering with healing process. Setting desired goal for each stressor or current issue identified.;Long Term Goal: Stressors or current issues are controlled or eliminated.;Short Term goal: Identification and review with participant of any Quality of Life or Depression concerns found by scoring the questionnaire.;Long Term goal: The participant improves quality of Life and PHQ9 Scores as seen by post scores and/or verbalization of changes             Quality of Life Scores:   Quality of Life - 06/13/23 1533       Quality of Life   Select Quality of Life      Quality of Life Scores   Health/Function Pre 13.23 %    Socioeconomic  Pre 16.19 %    Psych/Spiritual Pre 15.43 %    Family Pre 24.8 %    GLOBAL Pre 16 %            Scores of 19 and below usually indicate a poorer  quality of life in these areas.  A difference of  2-3 points is a clinically meaningful difference.  A difference of 2-3 points in the total score of the Quality of Life Index has been associated with significant improvement in overall quality of life, self-image, physical symptoms, and general health in studies assessing change in quality of life.  PHQ-9: Review Flowsheet       06/13/2023 09/25/2015 05/12/2015  Depression screen PHQ 2/9  Decreased Interest 1 3 2   Down, Depressed, Hopeless 1 3 0  PHQ - 2 Score 2 6 2   Altered sleeping 1 3 1   Tired, decreased energy 1 3 3   Change in appetite 0 0 1  Feeling bad or failure about yourself  0 1 3  Trouble concentrating 0 0 0  Moving slowly or fidgety/restless 0 0 0  Suicidal thoughts 0 0 0  PHQ-9 Score 4 13 10   Difficult doing work/chores Somewhat difficult Somewhat difficult Not difficult at all   Interpretation of Total Score  Total Score Depression Severity:  1-4 = Minimal depression, 5-9 = Mild depression, 10-14 = Moderate depression, 15-19 = Moderately severe depression, 20-27 = Severe depression   Psychosocial Evaluation and Intervention:  Psychosocial Evaluation - 06/08/23 1437       Psychosocial Evaluation & Interventions   Interventions Encouraged to exercise with the program and follow exercise prescription;Stress management education;Relaxation education    Comments Mr. 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 He said this was a wake up call and has been making big lifestyle changes. He has his [employer], so he is on hold from working until he starts the program. He then will have to do a physical and undergo a stress test to be able to do his job fully. He states this has been a stressor being off of work since he supports himself. His family and girlfriend have been helpful in motivating him, but he is ready to get back to work and his normal routine. He has made a lot of diet changes and he has cut his smoking from 2 packs  a day to about 5 cigarettes. He knows he needs to quit and is trying to. He has a history of anxiety and depression which he states is stable at this time. His biggest concern in getting back to work at full capacity and learning how to live a heart healthy lifestyle    Expected Outcomes Short: attend cardiac rehab for education and exercise. Long; Develop and maintain positive self care habits    Continue Psychosocial Services  Follow up required by staff             Psychosocial Re-Evaluation:  Psychosocial Re-Evaluation     Row Name 07/20/23 1747             Psychosocial Re-Evaluation   Current issues with Current Stress Concerns       Comments Patient reports that he has no changes in his current stress, sleep, or mental health. He does have a support system with his girlfriend and sister who he can relay on for support.       Expected Outcomes Short: continue to attend cardiac rehab to gain mental health benefits from exericse. Long:  maintain good mental health habits.       Interventions Encouraged to attend Cardiac Rehabilitation for the exercise       Continue Psychosocial Services  Follow up required by staff                Psychosocial Discharge (Final Psychosocial Re-Evaluation):  Psychosocial Re-Evaluation - 07/20/23 1747       Psychosocial Re-Evaluation   Current issues with Current Stress Concerns    Comments Patient reports that he has no changes in his current stress, sleep, or mental health. He does have a support system with his girlfriend and sister who he can relay on for support.    Expected Outcomes Short: continue to attend cardiac rehab to gain mental health benefits from exericse. Long: maintain good mental health habits.    Interventions Encouraged to attend Cardiac Rehabilitation for the exercise    Continue Psychosocial Services  Follow up required by staff             Vocational Rehabilitation: Provide vocational rehab assistance to  qualifying candidates.   Vocational Rehab Evaluation & Intervention:  Vocational Rehab - 06/13/23 1534       Initial Vocational Rehab Evaluation & Intervention   Assessment shows need for Vocational Rehabilitation No             Education: Education Goals: Education classes will be provided on a variety of topics geared toward better understanding of heart health and risk factor modification. Participant will state understanding/return demonstration of topics presented as noted by education test scores.  Learning Barriers/Preferences:  Learning Barriers/Preferences - 06/08/23 1421       Learning Barriers/Preferences   Learning Barriers None    Learning Preferences None             General Cardiac Education Topics:  AED/CPR: - Group verbal and written instruction with the use of models to demonstrate the basic use of the AED with the basic ABC's of resuscitation.   Anatomy and Cardiac Procedures: - Group verbal and visual presentation and models provide information about basic cardiac anatomy and function. Reviews the testing methods done to diagnose heart disease and the outcomes of the test results. Describes the treatment choices: Medical Management, Angioplasty, or Coronary Bypass Surgery for treating various heart conditions including Myocardial Infarction, Angina, Valve Disease, and Cardiac Arrhythmias.  Written material given at graduation. Flowsheet Row Cardiac Rehab from 06/13/2023 in [facility] Cardiac and Pulmonary Rehab  Education need identified 06/13/23       Medication Safety: - Group verbal and visual instruction to review commonly prescribed medications for heart and lung disease. Reviews the medication, class of the drug, and side effects. Includes the steps to properly store meds and maintain the prescription regimen.  Written material given at graduation.   Intimacy: - Group verbal instruction through game format to discuss how heart and lung disease can  affect sexual intimacy. Written material given at graduation..   Know Your Numbers and Heart Failure: - Group verbal and visual instruction to discuss disease risk factors for cardiac and pulmonary disease and treatment options.  Reviews associated critical values for Overweight/Obesity, Hypertension, Cholesterol, and Diabetes.  Discusses basics of heart failure: signs/symptoms and treatments.  Introduces Heart Failure Zone chart for action plan for heart failure.  Written material given at graduation. Flowsheet Row Cardiac Rehab from 06/13/2023 in [facility] Cardiac and Pulmonary Rehab  Education need identified 06/13/23       Infection Prevention: - Provides verbal and written material  to individual with discussion of infection control including proper hand washing and proper equipment cleaning during exercise session. Flowsheet Row Cardiac Rehab from 06/13/2023 in [facility] Cardiac and Pulmonary Rehab  Date 06/13/23  Educator [facility]  Instruction Review Code 1- Verbalizes Understanding       Falls Prevention: - Provides verbal and written material to individual with discussion of falls prevention and safety. Flowsheet Row Cardiac Rehab from 06/13/2023 in [facility] Cardiac and Pulmonary Rehab  Date 06/13/23  Educator [facility]  Instruction Review Code 1- Verbalizes Understanding       Other: -Provides group and verbal instruction on various topics (see comments)   Knowledge Questionnaire Score:  Knowledge Questionnaire Score - 06/13/23 1534       Knowledge Questionnaire Score   Pre Score 22/26             Core Components/Risk Factors/Patient Goals at Admission:  Personal Goals and Risk Factors at Admission - 06/13/23 1526       Core Components/Risk Factors/Patient Goals on Admission    Weight Management Yes    Intervention Weight Management: Develop a combined nutrition and exercise program designed to reach desired caloric intake, while maintaining appropriate intake of nutrient and fiber,  sodium and fats, and appropriate energy expenditure required for the weight goal.;Weight Management: Provide education and appropriate resources to help participant work on and attain dietary goals.;Weight Management/Obesity: Establish reasonable short term and long term weight goals.    Admit Weight 179 lb 3.2 oz (81.3 kg)    Goal Weight: Short Term 179 lb (81.2 kg)    Goal Weight: Long Term 179 lb (81.2 kg)    Expected Outcomes Short Term: Continue to assess and modify interventions until short term weight is achieved;Long Term: Adherence to nutrition and physical activity/exercise program aimed toward attainment of established weight goal;Weight Maintenance: Understanding of the daily nutrition guidelines, which includes 25-35% calories from fat, 7% or less cal from saturated fats, less than 200mg  cholesterol, less than 1.5gm of sodium, & 5 or more servings of fruits and vegetables daily;Understanding recommendations for meals to include 15-35% energy as protein, 25-35% energy from fat, 35-60% energy from carbohydrates, less than 200mg  of dietary cholesterol, 20-35 gm of total fiber daily;Understanding of distribution of calorie intake throughout the day with the consumption of 4-5 meals/snacks    Tobacco Cessation Yes    Number of packs per day 1/2    Intervention Assist the participant in steps to quit. Provide individualized education and counseling about committing to Tobacco Cessation, relapse prevention, and pharmacological support that can be provided by physician.;Education officer, environmental, assist with locating and accessing local/national Quit Smoking programs, and support quit date choice.    Expected Outcomes Short Term: Will demonstrate readiness to quit, by selecting a quit date.;Short Term: Will quit all tobacco product use, adhering to prevention of relapse plan.;Long Term: Complete abstinence from all tobacco products for at least 12 months from quit date.    Intervention Provide  education and support for participant on nutrition & aerobic/resistive exercise along with prescribed medications to achieve LDL 70mg , HDL >40mg .    Expected Outcomes Short Term: Participant states understanding of desired cholesterol values and is compliant with medications prescribed. Participant is following exercise prescription and nutrition guidelines.;Long Term: Cholesterol controlled with medications as prescribed, with individualized exercise RX and with personalized nutrition plan. Value goals: LDL < 70mg , HDL > 40 mg.             Education:Diabetes - Individual verbal and written  instruction to review signs/symptoms of diabetes, desired ranges of glucose level fasting, after meals and with exercise. Acknowledge that pre and post exercise glucose checks will be done for 3 sessions at entry of program.   Core Components/Risk Factors/Patient Goals Review:   Goals and Risk Factor Review     Row Name 06/13/23 1526 07/20/23 1743           Core Components/Risk Factors/Patient Goals Review   Personal Goals Review Tobacco Cessation Tobacco Cessation      Review Reita Cliche is a current tobacco user. Intervention for tobacco cessation was provided at the initial medical review. He was asked about readiness to quit and reported he has cut back to a half pack a day and is considering the steps he needs to take to quit smoking . Patient was advised and educated about tobacco cessation using combination therapy, tobacco cessation classes, quit line, and quit smoking apps. Patient demonstrated understanding of this material. Staff will continue to provide encouragement and follow up with the patient throughout the program. Reita Cliche reports that he is still smoking about 1/2 pack a day. He has in the past used the patch and been able to cut back more. He is looking possibly using the patch again to aid in trying to cut pack. Other smoking cessation options were discussed with patient. He is also planning  to buy fake cigarettes to help with the hand to mouth habit of smoking.      Expected Outcomes -- Short: start using the patch and fake cigarettes to help cut back on smoking. Long: become tobacco free.               Core Components/Risk Factors/Patient Goals at Discharge (Final Review):   Goals and Risk Factor Review - 07/20/23 1743       Core Components/Risk Factors/Patient Goals Review   Personal Goals Review Tobacco Cessation    Review Reita Cliche reports that he is still smoking about 1/2 pack a day. He has in the past used the patch and been able to cut back more. He is looking possibly using the patch again to aid in trying to cut pack. Other smoking cessation options were discussed with patient. He is also planning to buy fake cigarettes to help with the hand to mouth habit of smoking.    Expected Outcomes Short: start using the patch and fake cigarettes to help cut back on smoking. Long: become tobacco free.             ITP Comments:  ITP Comments     Row Name 06/08/23 1429 06/13/23 1523 06/15/23 1723 06/29/23 1146 07/27/23 1006   ITP Comments Initial phone call completed. Diagnosis can be found in [facility] 10/12. EP Orientation scheduled for Monday 11/4 1:30.   Delmer is a current tobacco user. Intervention for tobacco cessation was provided at the initial medical review. He was asked about readiness to quit and reported that he is ready to start trying. He has reduced his smoking from 2 packs to 5 cigs a day . Patient was advised and educated about tobacco cessation using combination therapy, tobacco cessation classes, quit line, and quit smoking apps. Patient demonstrated understanding of this material. Staff will continue to provide encouragement and follow up with the patient throughout the program. Completed and gym orientation. Initial ITP created and sent for review to Dr. Bethann Punches, Medical Director. Reita Cliche is a current tobacco user. Intervention for tobacco cessation was  provided at the initial medical review.  He was asked about readiness to quit and reported he has cut back to a half pack a day and is considering the steps he needs to take to quit smoking . Patient was advised and educated about tobacco cessation using combination therapy, tobacco cessation classes, quit line, and quit smoking apps. Patient demonstrated understanding of this material. Staff will continue to provide encouragement and follow up with the patient throughout the program. First full day of exercise!  Patient was oriented to gym and equipment including functions, settings, policies, and procedures.  Patient's individual exercise prescription and treatment plan were reviewed.  All starting workloads were established based on the results of the 6 minute walk test done at initial orientation visit.  The plan for exercise progression was also introduced and progression will be customized based on patient's performance and goals. 30 Day review completed. Medical Director ITP review done, changes made as directed, and signed approval by Medical Director.    new to program 30 Day review completed. Medical Director ITP review done, changes made as directed, and signed approval by Medical Director.            Comments:

## 2023-07-28 ENCOUNTER — Encounter: Payer: BC Managed Care – PPO | Admitting: *Deleted

## 2023-07-28 DIAGNOSIS — Z955 Presence of coronary angioplasty implant and graft: Secondary | ICD-10-CM

## 2023-07-28 DIAGNOSIS — I213 ST elevation (STEMI) myocardial infarction of unspecified site: Secondary | ICD-10-CM

## 2023-07-28 NOTE — Progress Notes (Signed)
Daily Session Note  Patient Details  Name: Joshua Leblanc. MRN: [id number] Date of Birth: 11-22-1975 Referring Provider:   Flowsheet Row Cardiac Rehab from 06/13/2023 in [facility] Cardiac and Pulmonary Rehab  Referring Provider Rita Ohara       Encounter Date: 07/28/2023  Check In:  Session Check In - 07/28/23 1710       Check-In   Supervising physician immediately available to respond to emergencies See telemetry face sheet for immediately available ER MD    Location [facility]-Cardiac & Pulmonary Rehab    Staff Present Susann Givens, RN BSN;Joseph Hood, RCP,RRT,BSRT;Maxon [place] BS, , Exercise Physiologist;Margaret Best, MS, Exercise Physiologist    Virtual Visit No    Medication changes reported     No    Fall or balance concerns reported    No    Tobacco Cessation Use Decreased    Current number of cigarettes/nicotine per day     10    Warm-up and Cool-down Performed on first and last piece of equipment    Resistance Training Performed Yes    VAD Patient? No    PAD/SET Patient? No      Pain Assessment   Currently in Pain? No/denies                Social History   Tobacco Use  Smoking Status Every Day   Current packs/day: 1.00   Average packs/day: 1 pack/day for 15.0 years (15.0 ttl pk-yrs)   Types: Cigarettes  Smokeless Tobacco Never    Goals Met:  Independence with exercise equipment Exercise tolerated well No report of concerns or symptoms today Strength training completed today  Goals Unmet:  Not Applicable  Comments: Pt able to follow exercise prescription today without complaint.  Will continue to monitor for progression.    Dr. Bethann Punches is Medical Director for [facility] Cardiac Rehabilitation.  Dr. Vida Rigger is Medical Director for [facility] Pulmonary Rehabilitation.

## 2023-08-04 ENCOUNTER — Encounter: Payer: BC Managed Care – PPO | Admitting: *Deleted

## 2023-08-04 DIAGNOSIS — I213 ST elevation (STEMI) myocardial infarction of unspecified site: Secondary | ICD-10-CM

## 2023-08-04 DIAGNOSIS — Z955 Presence of coronary angioplasty implant and graft: Secondary | ICD-10-CM | POA: Diagnosis not present

## 2023-08-04 NOTE — Progress Notes (Signed)
Daily Session Note  Patient Details  Name: Joshua Leblanc. MRN: [id number] Date of Birth: [birth year] Referring Provider:   Flowsheet Row Cardiac Rehab from 06/13/2023 in [facility] Cardiac and Pulmonary Rehab  Referring Provider Rita Ohara       Encounter Date: 08/04/2023  Check In:  Session Check In - 08/04/23 1707       Check-In   Supervising physician immediately available to respond to emergencies See telemetry face sheet for immediately available ER MD    Location [facility]-Cardiac & Pulmonary Rehab    Staff Present Susann Givens, RN BSN;Joseph Hood, RCP,RRT,BSRT;Maxon [place] BS, , Exercise Physiologist    Virtual Visit No    Medication changes reported     Yes    Comments doubled lipitor    Fall or balance concerns reported    No    Tobacco Cessation No Change    Current number of cigarettes/nicotine per day     10    Warm-up and Cool-down Performed on first and last piece of equipment    Resistance Training Performed Yes    VAD Patient? No    PAD/SET Patient? No      Pain Assessment   Currently in Pain? No/denies                Social History   Tobacco Use  Smoking Status Every Day   Current packs/day: 1.00   Average packs/day: 1 pack/day for 15.0 years (15.0 ttl pk-yrs)   Types: Cigarettes  Smokeless Tobacco Never    Goals Met:  Independence with exercise equipment Exercise tolerated well No report of concerns or symptoms today Strength training completed today  Goals Unmet:  Not Applicable  Comments: Pt able to follow exercise prescription today without complaint.  Will continue to monitor for progression.    Dr. Bethann Punches is Medical Director for [facility] Cardiac Rehabilitation.  Dr. Vida Rigger is Medical Director for [facility] Pulmonary Rehabilitation.

## 2023-08-08 ENCOUNTER — Ambulatory Visit: Payer: Self-pay

## 2023-08-11 ENCOUNTER — Encounter: Payer: BC Managed Care – PPO | Attending: Cardiology | Admitting: *Deleted

## 2023-08-11 DIAGNOSIS — I213 ST elevation (STEMI) myocardial infarction of unspecified site: Secondary | ICD-10-CM

## 2023-08-11 DIAGNOSIS — Z48812 Encounter for surgical aftercare following surgery on the circulatory system: Secondary | ICD-10-CM | POA: Insufficient documentation

## 2023-08-11 DIAGNOSIS — Z955 Presence of coronary angioplasty implant and graft: Secondary | ICD-10-CM | POA: Insufficient documentation

## 2023-08-11 DIAGNOSIS — I252 Old myocardial infarction: Secondary | ICD-10-CM | POA: Diagnosis not present

## 2023-08-11 NOTE — Progress Notes (Signed)
 Daily Session Note  Patient Details  Name: Joshua Leblanc. MRN: [id number] Date of Birth: 11/27/1975 Referring Provider:   Flowsheet Row Cardiac Rehab from 06/13/2023 in [facility] Cardiac and Pulmonary Rehab  Referring Provider Marsa Loge       Encounter Date: 08/11/2023  Check In:  Session Check In - 08/11/23 1721       Check-In   Supervising physician immediately available to respond to emergencies See telemetry face sheet for immediately available ER MD    Location [facility]-Cardiac & Pulmonary Rehab    Staff Present Hoy Rodney, RN BSN;Maxon [place] BS, , Exercise Physiologist;Margaret Best, MS, Exercise Physiologist    Virtual Visit No    Medication changes reported     Yes    Fall or balance concerns reported    No    Tobacco Cessation No Change    Current number of cigarettes/nicotine  per day     10    Warm-up and Cool-down Performed on first and last piece of equipment    Resistance Training Performed Yes    VAD Patient? No    PAD/SET Patient? No      Pain Assessment   Currently in Pain? No/denies                Social History   Tobacco Use  Smoking Status Every Day   Current packs/day: 1.00   Average packs/day: 1 pack/day for 15.0 years (15.0 ttl pk-yrs)   Types: Cigarettes  Smokeless Tobacco Never    Goals Met:  Independence with exercise equipment Exercise tolerated well No report of concerns or symptoms today Strength training completed today  Goals Unmet:  Not Applicable  Comments: Pt able to follow exercise prescription today without complaint.  Will continue to monitor for progression.    Dr. Oneil Pinal is Medical Director for [facility] Cardiac Rehabilitation.  Dr. Fuad Aleskerov is Medical Director for [facility] Pulmonary Rehabilitation.

## 2023-08-15 ENCOUNTER — Other Ambulatory Visit: Payer: Self-pay

## 2023-08-15 MED ORDER — ATORVASTATIN CALCIUM 80 MG PO TABS
80.0000 mg | ORAL_TABLET | Freq: Every day | ORAL | 5 refills | Status: AC
  Filled 2023-08-15: qty 30, 30d supply, fill #0

## 2023-08-15 MED ORDER — METOPROLOL SUCCINATE ER 25 MG PO TB24
25.0000 mg | ORAL_TABLET | Freq: Every day | ORAL | 5 refills | Status: AC
  Filled 2023-08-15: qty 30, 30d supply, fill #0

## 2023-08-15 MED ORDER — PRASUGREL HCL 10 MG PO TABS
10.0000 mg | ORAL_TABLET | Freq: Every day | ORAL | 5 refills | Status: AC
  Filled 2023-08-15: qty 30, 30d supply, fill #0

## 2023-08-15 MED ORDER — LISINOPRIL 2.5 MG PO TABS
2.5000 mg | ORAL_TABLET | Freq: Every day | ORAL | 5 refills | Status: AC
  Filled 2023-08-15: qty 30, 30d supply, fill #0

## 2023-08-22 ENCOUNTER — Encounter: Payer: BC Managed Care – PPO | Admitting: *Deleted

## 2023-08-22 DIAGNOSIS — Z955 Presence of coronary angioplasty implant and graft: Secondary | ICD-10-CM

## 2023-08-22 DIAGNOSIS — I213 ST elevation (STEMI) myocardial infarction of unspecified site: Secondary | ICD-10-CM

## 2023-08-22 DIAGNOSIS — Z48812 Encounter for surgical aftercare following surgery on the circulatory system: Secondary | ICD-10-CM | POA: Diagnosis not present

## 2023-08-22 NOTE — Progress Notes (Signed)
 Daily Session Note  Patient Details  Name: Joshua Leblanc. MRN: [id number] Date of Birth: [birth year] Referring Provider:   Flowsheet Row Cardiac Rehab from 06/13/2023 in [facility] Cardiac and Pulmonary Rehab  Referring Provider Marsa Loge       Encounter Date: 08/22/2023  Check In:  Session Check In - 08/22/23 1723       Check-In   Supervising physician immediately available to respond to emergencies See telemetry face sheet for immediately available ER MD    Location [facility]-Cardiac & Pulmonary Rehab    Staff Present Hoy Rodney, RN BSN;Joseph Winston, RCP,RRT,BSRT;Kelly Barataria, [place], ACSM CEP, Exercise Physiologist;Laureen Delores, [place], RRT, CPFT    Virtual Visit No    Medication changes reported     No    Fall or balance concerns reported    No    Tobacco Cessation No Change    Current number of cigarettes/nicotine  per day     10    Warm-up and Cool-down Performed on first and last piece of equipment    Resistance Training Performed Yes    VAD Patient? No    PAD/SET Patient? No      Pain Assessment   Currently in Pain? No/denies                Social History   Tobacco Use  Smoking Status Every Day   Current packs/day: 1.00   Average packs/day: 1 pack/day for 15.0 years (15.0 ttl pk-yrs)   Types: Cigarettes  Smokeless Tobacco Never    Goals Met:  Independence with exercise equipment Exercise tolerated well Personal goals reviewed No report of concerns or symptoms today Strength training completed today  Goals Unmet:  Not Applicable  Comments: Pt able to follow exercise prescription today without complaint.  Will continue to monitor for progression.    Dr. Oneil Pinal is Medical Director for [facility] Cardiac Rehabilitation.  Dr. Fuad Aleskerov is Medical Director for [facility] Pulmonary Rehabilitation.

## 2023-08-24 ENCOUNTER — Encounter: Payer: Self-pay | Admitting: *Deleted

## 2023-08-24 DIAGNOSIS — Z955 Presence of coronary angioplasty implant and graft: Secondary | ICD-10-CM

## 2023-08-24 DIAGNOSIS — I213 ST elevation (STEMI) myocardial infarction of unspecified site: Secondary | ICD-10-CM

## 2023-08-24 NOTE — Progress Notes (Signed)
 Cardiac Individual Treatment Plan  Patient Details  Name: Joshua Leblanc. MRN: [id number] Date of Birth: July 27, 1976 Referring Provider:   Flowsheet Row Cardiac Rehab from 06/13/2023 in [facility] Cardiac and Pulmonary Rehab  Referring Provider Barbar Levine       Initial Encounter Date:  Flowsheet Row Cardiac Rehab from 06/13/2023 in [facility] Cardiac and Pulmonary Rehab  Date 06/13/23       Visit Diagnosis: ST elevation myocardial infarction (STEMI), unspecified artery [facility])  Status post coronary artery stent placement  Patient's Home Medications on Admission:  Current Outpatient Medications:    aspirin  EC (ASPIRIN  81) 81 MG tablet, Take 1 tablet (81 mg total) by mouth daily., Disp: 60 tablet, Rfl: 0   aspirin  EC 81 MG tablet, Take 1 tablet (81 mg total) by mouth daily. Swallow whole., Disp: 60 tablet, Rfl: 0   atorvastatin  (LIPITOR ) 80 MG tablet, Take 1 tablet (80 mg total) by mouth daily., Disp: 30 tablet, Rfl: 5   atorvastatin  (LIPITOR ) 80 MG tablet, Take 1 tablet (80 mg total) by mouth once daily, Disp: 30 tablet, Rfl: 5   atorvastatin  (LIPITOR ) 80 MG tablet, Take 1 tablet (80 mg total) by mouth daily., Disp: 30 tablet, Rfl: 5   atorvastatin  (LIPITOR ) 80 MG tablet, Take 1 tablet (80 mg total) by mouth daily., Disp: 30 tablet, Rfl: 5   cephALEXin  (KEFLEX ) 500 MG capsule, Take by mouth., Disp: , Rfl:    diazepam (VALIUM) 5 MG tablet, Take 0.5 to 1 tab every 8 hours as needed for anxiety., Disp: , Rfl:    FLUoxetine  (PROZAC ) 20 MG capsule, Take by mouth., Disp: , Rfl:    lisinopril  (ZESTRIL ) 2.5 MG tablet, Take 1 tablet (2.5 mg total) by mouth daily., Disp: 30 tablet, Rfl: 5   lisinopril  (ZESTRIL ) 2.5 MG tablet, Take 1 tablet (2.5 mg total) by mouth once daily, Disp: 30 tablet, Rfl: 5   lisinopril  (ZESTRIL ) 2.5 MG tablet, Take 1 tablet (2.5 mg total) by mouth daily., Disp: 30 tablet, Rfl: 5   lisinopril  (ZESTRIL ) 2.5 MG tablet, Take 1 tablet (2.5 mg total) by mouth daily.,  Disp: 30 tablet, Rfl: 5   metoprolol  succinate (TOPROL -XL) 25 MG 24 hr tablet, Take 1 tablet (25 mg total) by mouth once daily., Disp: 30 tablet, Rfl: 5   metoprolol  succinate (TOPROL -XL) 25 MG 24 hr tablet, Take 1 tablet (25 mg total) by mouth once daily, Disp: 30 tablet, Rfl: 5   metoprolol  succinate (TOPROL -XL) 25 MG 24 hr tablet, Take 1 tablet (25 mg total) by mouth daily., Disp: 30 tablet, Rfl: 5   metoprolol  succinate (TOPROL -XL) 25 MG 24 hr tablet, Take 1 tablet (25 mg total) by mouth daily., Disp: 30 tablet, Rfl: 5   nicotine  (NICODERM CQ  - DOSED IN MG/24 HOURS) 21 mg/24hr patch, Place 1 patch (21 mg total) onto the skin daily., Disp: , Rfl:    prasugrel  (EFFIENT ) 10 MG TABS tablet, Take 1 tablet (10 mg total) by mouth once daily., Disp: 30 tablet, Rfl: 5   prasugrel  (EFFIENT ) 10 MG TABS tablet, Take 1 tablet (10 mg total) by mouth once daily, Disp: 30 tablet, Rfl: 5   prasugrel  (EFFIENT ) 10 MG TABS tablet, Take 1 tablet (10 mg total) by mouth daily., Disp: 30 tablet, Rfl: 5   prasugrel  (EFFIENT ) 10 MG TABS tablet, Take 1 tablet (10 mg total) by mouth daily., Disp: 30 tablet, Rfl: 5   spironolactone  (ALDACTONE ) 25 MG tablet, Take 0.5 tablets (12.5 mg total) by mouth daily., Disp: 30  tablet, Rfl: 0  Past Medical History: Past Medical History:  Diagnosis Date   Anxiety    Depression    Headache    Neck pain     Tobacco Use: Social History   Tobacco Use  Smoking Status Every Day   Current packs/day: 1.00   Average packs/day: 1 pack/day for 15.0 years (15.0 ttl pk-yrs)   Types: Cigarettes  Smokeless Tobacco Never    Labs: Review Flowsheet       Latest Ref Rng & Units 05/21/2023  Labs for ITP Cardiac and Pulmonary Rehab  Cholestrol 0 - 200 mg/dL 161   LDL (calc) 0 - 99 mg/dL 94   HDL-C >09 mg/dL 32   Trlycerides <604 mg/dL 540   Hemoglobin J8J 4.8 - 5.6 % 5.4      Exercise Target Goals: Exercise Program Goal: Individual exercise prescription set using results from  initial 6 min walk test and THRR while considering  patient's activity barriers and safety.   Exercise Prescription Goal: Initial exercise prescription builds to 30-45 minutes a day of aerobic activity, 2-3 days per week.  Home exercise guidelines will be given to patient during program as part of exercise prescription that the participant will acknowledge.   Education: Aerobic Exercise: - Group verbal and visual presentation on the components of exercise prescription. Introduces F.I.T.T principle from ACSM for exercise prescriptions.  Reviews F.I.T.T. principles of aerobic exercise including progression. Written material given at graduation.   Education: Resistance Exercise: - Group verbal and visual presentation on the components of exercise prescription. Introduces F.I.T.T principle from ACSM for exercise prescriptions  Reviews F.I.T.T. principles of resistance exercise including progression. Written material given at graduation.    Education: Exercise & Equipment Safety: - Individual verbal instruction and demonstration of equipment use and safety with use of the equipment. Flowsheet Row Cardiac Rehab from 06/13/2023 in [facility] Cardiac and Pulmonary Rehab  Date 06/13/23  Educator [facility]  Instruction Review Code 1- Verbalizes Understanding       Education: Exercise Physiology & General Exercise Guidelines: - Group verbal and written instruction with models to review the exercise physiology of the cardiovascular system and associated critical values. Provides general exercise guidelines with specific guidelines to those with heart or lung disease.    Education: Flexibility, Balance, Mind/Body Relaxation: - Group verbal and visual presentation with interactive activity on the components of exercise prescription. Introduces F.I.T.T principle from ACSM for exercise prescriptions. Reviews F.I.T.T. principles of flexibility and balance exercise training including progression. Also discusses the  mind body connection.  Reviews various relaxation techniques to help reduce and manage stress (i.e. Deep breathing, progressive muscle relaxation, and visualization). Balance handout provided to take home. Written material given at graduation.   Activity Barriers & Risk Stratification:  Activity Barriers & Cardiac Risk Stratification - 06/13/23 1535       Activity Barriers & Cardiac Risk Stratification   Activity Barriers None    Cardiac Risk Stratification Moderate             6 Minute Walk:  6 Minute Walk     Row Name 06/13/23 1527         6 Minute Walk   Phase Initial     Distance 1280 feet     Walk Time 6 minutes     # of Rest Breaks 0     MPH 2.42     METS 4.3     RPE 9     Perceived Dyspnea  0  VO2 Peak 15.1     Symptoms No     Resting HR 70 bpm     Resting BP 112/62     Resting Oxygen Saturation  99 %     Exercise Oxygen Saturation  during 6 min walk 98 %     Max Ex. HR 88 bpm     Max Ex. BP 146/70     2 Minute Post BP 120/70              Oxygen Initial Assessment:   Oxygen Re-Evaluation:   Oxygen Discharge (Final Oxygen Re-Evaluation):   Initial Exercise Prescription:  Initial Exercise Prescription - 06/13/23 1500       Date of Initial Exercise RX and Referring Provider   Date 06/13/23    Referring Provider Barbar Levine      Oxygen   Maintain Oxygen Saturation 88% or higher      Treadmill   MPH 2.7    Grade 1    Minutes 15    METs 4.3      NuStep   Level 3    SPM 80    Minutes 15    METs 4.3      Elliptical   Level 1    Speed 3    Minutes 15    METs 4.3      REL-XR   Level 3    Speed 50    Minutes 15    METs 4.3      T5 Nustep   Level 3    SPM 80    Minutes 15    METs 4.3      Prescription Details   Frequency (times per week) 3    Duration Progress to 30 minutes of continuous aerobic without signs/symptoms of physical distress      Intensity   THRR 40-80% of Max Heartrate 111-153    Ratings of  Perceived Exertion 11-13    Perceived Dyspnea 0-4      Progression   Progression Continue to progress workloads to maintain intensity without signs/symptoms of physical distress.      Resistance Training   Training Prescription Yes    Weight 4    Reps 10-15             Perform Capillary Blood Glucose checks as needed.  Exercise Prescription Changes:   Exercise Prescription Changes     Row Name 06/13/23 1500 06/30/23 1400 07/06/23 1700 07/12/23 1400 07/27/23 1400     Response to Exercise   Blood Pressure (Admit) 112/62 120/60 -- 108/72 120/70   Blood Pressure (Exercise) 146/70 150/80 -- 146/70 142/70   Blood Pressure (Exit) 120/70 110/70 -- 104/70 104/64   Heart Rate (Admit) 70 bpm 69 bpm -- 80 bpm 86 bpm   Heart Rate (Exercise) 88 bpm 122 bpm -- 111 bpm 127 bpm   Heart Rate (Exit) 71 bpm 87 bpm -- 89 bpm 91 bpm   Oxygen Saturation (Admit) 99 % -- -- -- --   Oxygen Saturation (Exercise) 98 % -- -- -- --   Oxygen Saturation (Exit) 98 % -- -- -- --   Rating of Perceived Exertion (Exercise) 9 13 -- 13 13   Perceived Dyspnea (Exercise) 0 -- -- 0 0   Symptoms none none -- none none   Comments 6 MWT results First full week of exercise -- -- --   Duration -- Continue with 30 min of aerobic exercise without signs/symptoms of physical distress. -- Progress to 30  minutes of  aerobic without signs/symptoms of physical distress Progress to 30 minutes of  aerobic without signs/symptoms of physical distress   Intensity -- THRR unchanged THRR unchanged THRR unchanged THRR unchanged     Progression   Progression -- Continue to progress workloads to maintain intensity without signs/symptoms of physical distress. Continue to progress workloads to maintain intensity without signs/symptoms of physical distress. Continue to progress workloads to maintain intensity without signs/symptoms of physical distress. Continue to progress workloads to maintain intensity without signs/symptoms of physical  distress.   Average METs -- 3.84 3.84 3.75 3.93     Resistance Training   Training Prescription -- Yes Yes Yes Yes   Weight -- 4 lb 4 lb 8 lb 8 lb   Reps -- 10-15 10-15 10-15 10-15     Interval Training   Interval Training -- No No No No     Treadmill   MPH -- 2.7 2.7 3 4    Grade -- 1 1 0.5 0.5   Minutes -- 15 15 15 15    METs -- 3.44 3.44 3.5 4.34     NuStep   Level -- 5 5 -- 5   Minutes -- 15 15 -- 15   METs -- 4.9 4.9 -- 4.6     REL-XR   Level -- 5 5 7 5    Minutes -- 15 15 15 15    METs -- 4.8 4.8 -- --     T5 Nustep   Level -- -- -- -- 5   Minutes -- -- -- -- 15   METs -- -- -- -- 3.9     Biostep-RELP   Level -- -- -- 4 --   Minutes -- -- -- 15 --   METs -- -- -- 4 --     Home Exercise Plan   Plans to continue exercise at -- -- Home (comment)  walking and stationary bike Home (comment)  walking and stationary bike Home (comment)  walking and stationary bike   Frequency -- -- Add 2 additional days to program exercise sessions. Add 2 additional days to program exercise sessions. Add 2 additional days to program exercise sessions.   Initial Home Exercises Provided -- -- 07/06/23 07/06/23 07/06/23     Oxygen   Maintain Oxygen Saturation -- 88% or higher 88% or higher 88% or higher 88% or higher    Row Name 08/11/23 1200             Response to Exercise   Blood Pressure (Admit) 122/76       Blood Pressure (Exit) 106/60       Heart Rate (Admit) 69 bpm       Heart Rate (Exercise) 121 bpm       Heart Rate (Exit) 91 bpm       Oxygen Saturation (Admit) 98 %       Oxygen Saturation (Exercise) 93 %       Oxygen Saturation (Exit) 93 %       Rating of Perceived Exertion (Exercise) 13       Perceived Dyspnea (Exercise) 0       Symptoms none       Duration Progress to 30 minutes of  aerobic without signs/symptoms of physical distress       Intensity THRR unchanged         Progression   Progression Continue to progress workloads to maintain intensity without  signs/symptoms of physical distress.       Average METs 4.28  Resistance Training   Training Prescription Yes       Weight 8 lb       Reps 10-15         Interval Training   Interval Training No         Treadmill   MPH 3       Grade 0.5       Minutes 15       METs 3.5         NuStep   Level 7       Minutes 15       METs 6.3         T5 Nustep   Level 7  T6       Minutes 15       METs 4         Home Exercise Plan   Plans to continue exercise at Home (comment)  walking and stationary bike       Frequency Add 2 additional days to program exercise sessions.       Initial Home Exercises Provided 07/06/23         Oxygen   Maintain Oxygen Saturation 88% or higher                Exercise Comments:   Exercise Comments     Row Name 06/15/23 1723           Exercise Comments First full day of exercise!  Patient was oriented to gym and equipment including functions, settings, policies, and procedures.  Patient's individual exercise prescription and treatment plan were reviewed.  All starting workloads were established based on the results of the 6 minute walk test done at initial orientation visit.  The plan for exercise progression was also introduced and progression will be customized based on patient's performance and goals.                Exercise Goals and Review:   Exercise Goals     Row Name 06/13/23 1531             Exercise Goals   Increase Physical Activity Yes       Intervention Provide advice, education, support and counseling about physical activity/exercise needs.;Develop an individualized exercise prescription for aerobic and resistive training based on initial evaluation findings, risk stratification, comorbidities and participant's personal goals.       Expected Outcomes Short Term: Attend rehab on a regular basis to increase amount of physical activity.;Long Term: Add in home exercise to make exercise part of routine and to increase  amount of physical activity.;Long Term: Exercising regularly at least 3-5 days a week.       Increase Strength and Stamina Yes       Intervention Provide advice, education, support and counseling about physical activity/exercise needs.;Develop an individualized exercise prescription for aerobic and resistive training based on initial evaluation findings, risk stratification, comorbidities and participant's personal goals.       Expected Outcomes Short Term: Increase workloads from initial exercise prescription for resistance, speed, and METs.;Short Term: Perform resistance training exercises routinely during rehab and add in resistance training at home;Long Term: Improve cardiorespiratory fitness, muscular endurance and strength as measured by increased METs and functional capacity ( )       Able to understand and use rate of perceived exertion (RPE) scale Yes       Intervention Provide education and explanation on how to use RPE scale       Expected Outcomes  Short Term: Able to use RPE daily in rehab to express subjective intensity level;Long Term:  Able to use RPE to guide intensity level when exercising independently       Able to understand and use Dyspnea scale Yes       Intervention Provide education and explanation on how to use Dyspnea scale       Expected Outcomes Short Term: Able to use Dyspnea scale daily in rehab to express subjective sense of shortness of breath during exertion;Long Term: Able to use Dyspnea scale to guide intensity level when exercising independently       Knowledge and understanding of Target Heart Rate Range (THRR) Yes       Intervention Provide education and explanation of THRR including how the numbers were predicted and where they are located for reference       Expected Outcomes Short Term: Able to state/look up THRR;Long Term: Able to use THRR to govern intensity when exercising independently;Short Term: Able to use daily as guideline for intensity in rehab        Able to check pulse independently Yes       Intervention Provide education and demonstration on how to check pulse in carotid and radial arteries.;Review the importance of being able to check your own pulse for safety during independent exercise       Expected Outcomes Short Term: Able to explain why pulse checking is important during independent exercise;Long Term: Able to check pulse independently and accurately       Understanding of Exercise Prescription Yes       Intervention Provide education, explanation, and written materials on patient's individual exercise prescription       Expected Outcomes Short Term: Able to explain program exercise prescription;Long Term: Able to explain home exercise prescription to exercise independently                Exercise Goals Re-Evaluation :  Exercise Goals Re-Evaluation     Row Name 06/15/23 1724 06/30/23 1429 07/06/23 1733 07/12/23 1456 07/27/23 1440     Exercise Goal Re-Evaluation   Exercise Goals Review Increase Physical Activity;Able to understand and use rate of perceived exertion (RPE) scale;Knowledge and understanding of Target Heart Rate Range (THRR);Understanding of Exercise Prescription;Increase Strength and Stamina;Able to check pulse independently Increase Physical Activity;Increase Strength and Stamina;Understanding of Exercise Prescription Increase Physical Activity;Able to understand and use rate of perceived exertion (RPE) scale;Knowledge and understanding of Target Heart Rate Range (THRR);Understanding of Exercise Prescription;Increase Strength and Stamina;Able to understand and use Dyspnea scale;Able to check pulse independently Increase Physical Activity;Increase Strength and Stamina;Understanding of Exercise Prescription Increase Physical Activity;Increase Strength and Stamina;Understanding of Exercise Prescription   Comments Reviewed RPE and dyspnea scale, THR and program prescription with pt today.  Pt voiced understanding and was  given a copy of goals to take home. Baylie is off to a good start in the program. He did well on the treadmill at a speed of 2.7 mph with an incline of 1%. He also improved to level 5 on both the XR and T4 nustep in just his first week of rehab! We will continue to monitor his progress in the program. Reviewed home exercise with pt today.  Pt plans to walk and use a stationary bike for exercise.  Reviewed THR, pulse, RPE, sign and symptoms, pulse oximetery and when to call 911 or MD.  Also discussed weather considerations and indoor options.  Pt voiced understanding. Joshua Leblanc is doing well in rehab. He has been  able to increase his handweights to 8lb, and level on the XR to level 7. He was also able to increase his speed on the treadmill from 2.7 mph to 3 mph. We will continue to monitor his progress in the program. Joshua Leblanc continues to do well in rehab. He has been able to increase his level on the T5 nustep from level 3 to level 5. He was also able to increase his speed on the treadmill from 3 to 4 mph. We will continue to monitor his progress in the program.   Expected Outcomes Short: Use RPE daily to regulate intensity.  Long: Follow program prescription in THR. Short: Continue to follow current exercise prescription. Long: Continue exercise to improve strength and stamina. Short: buy a stationary bike and add 1-2 days a week of exercise at home on off days of rehab. Long: become independent with exercise routine. Short: Continue to follow current exercise prescription. Long: Continue exercise to improve strength and stamina. Short: Continue to follow current exercise prescription. Long: Continue exercise to improve strength and stamina.    Row Name 08/11/23 1224 08/22/23 1730           Exercise Goal Re-Evaluation   Exercise Goals Review Increase Physical Activity;Increase Strength and Stamina;Understanding of Exercise Prescription Increase Strength and Stamina;Understanding of Exercise Prescription       Comments Joshua Leblanc is doing well in rehab. He was able to increase to level 7 on both the T6 and T4 nustep. He did however slightly decline in workload on the treadmill, but we will continue to encourage and monitor his progression. Joshua Leblanc has had a hard time progressing with his workloads as he would like due to leg pain. He will be getting a stent put in leg tomorrow to help increase blood flow and hopefully reduce pain. He will be out the remainder of the week and plans to start back next week. He is also considering buying a treadmill for at home use to be able to increase to 4-5 days a week of exercise.      Expected Outcomes Short: Increase treadmill workload to previous levels. Long: Continue exercise to increase strength and stamina. Short: get stent put in leg this week. Long: reduced leg pain will help Joshua Leblanc to be able to increase his intensities more efficiently to make the gains he wants to see in his cardiovascular fitness.               Discharge Exercise Prescription (Final Exercise Prescription Changes):  Exercise Prescription Changes - 08/11/23 1200       Response to Exercise   Blood Pressure (Admit) 122/76    Blood Pressure (Exit) 106/60    Heart Rate (Admit) 69 bpm    Heart Rate (Exercise) 121 bpm    Heart Rate (Exit) 91 bpm    Oxygen Saturation (Admit) 98 %    Oxygen Saturation (Exercise) 93 %    Oxygen Saturation (Exit) 93 %    Rating of Perceived Exertion (Exercise) 13    Perceived Dyspnea (Exercise) 0    Symptoms none    Duration Progress to 30 minutes of  aerobic without signs/symptoms of physical distress    Intensity THRR unchanged      Progression   Progression Continue to progress workloads to maintain intensity without signs/symptoms of physical distress.    Average METs 4.28      Resistance Training   Training Prescription Yes    Weight 8 lb    Reps 10-15  Interval Training   Interval Training No      Treadmill   MPH 3    Grade 0.5    Minutes 15     METs 3.5      NuStep   Level 7    Minutes 15    METs 6.3      T5 Nustep   Level 7   T6   Minutes 15    METs 4      Home Exercise Plan   Plans to continue exercise at Home (comment)   walking and stationary bike   Frequency Add 2 additional days to program exercise sessions.    Initial Home Exercises Provided 07/06/23      Oxygen   Maintain Oxygen Saturation 88% or higher             Nutrition:  Target Goals: Understanding of nutrition guidelines, daily intake of sodium 1500mg , cholesterol 200mg , calories 30% from fat and 7% or less from saturated fats, daily to have 5 or more servings of fruits and vegetables.  Education: All About Nutrition: -Group instruction provided by verbal, written material, interactive activities, discussions, models, and posters to present general guidelines for heart healthy nutrition including fat, fiber, MyPlate, the role of sodium in heart healthy nutrition, utilization of the nutrition label, and utilization of this knowledge for meal planning. Follow up email sent as well. Written material given at graduation. Flowsheet Row Cardiac Rehab from 06/13/2023 in [facility] Cardiac and Pulmonary Rehab  Education need identified 06/13/23       Biometrics:  Pre Biometrics - 06/13/23 1532       Pre Biometrics   Height 5\' 10"  (1.778 m)    Weight 179 lb 3.2 oz (81.3 kg)    Waist Circumference 35.5 inches    Hip Circumference 40.5 inches    Waist to Hip Ratio 0.88 %    BMI (Calculated) 25.71    Single Leg Stand 30 seconds              Nutrition Therapy Plan and Nutrition Goals:  Nutrition Therapy & Goals - 06/13/23 1541       Nutrition Therapy   Diet Cardiac, Low Na    Protein (specify units) 90    Fiber 30 grams    Whole Grain Foods 3 servings    Saturated Fats 15 max. grams    Fruits and Vegetables 5 servings/day    Sodium 2 grams      Personal Nutrition Goals   Nutrition Goal Pair a protein or healthy fat with a complex carb     Personal Goal #2 Continue to limit fried foods and fatty meats.    Personal Goal #3 Continue to watch sodium and saturated fat    Comments Patient drinking 48oz of water consistently. Has made a lot of changes to cut back on sodium, processed foods and sugar. Educated on types of fats, sources, and how to read a label. Reviewed 24hr food recall, spoke on ways to meet nutritional needs when appetite is down. Encouraged more balanced plates with a protein or healthy fat paired with a carb. Provided handout with examples and built out several meals and snacks with foods he likes and will eat focusing on reducing saturated fat and pairing proteins and carbs together.      Intervention Plan   Intervention Prescribe, educate and counsel regarding individualized specific dietary modifications aiming towards targeted core components such as weight, hypertension, lipid management, diabetes, heart failure and other comorbidities.;Nutrition  handout(s) given to patient.    Expected Outcomes Short Term Goal: Understand basic principles of dietary content, such as calories, fat, sodium, cholesterol and nutrients.;Short Term Goal: A plan has been developed with personal nutrition goals set during dietitian appointment.;Long Term Goal: Adherence to prescribed nutrition plan.             Nutrition Assessments:  MEDIFICTS Score Key: >=70 Need to make dietary changes  40-70 Heart Healthy Diet <= 40 Therapeutic Level Cholesterol Diet  Flowsheet Row Cardiac Rehab from 06/15/2023 in [facility] Cardiac [facility] Pulmonary Rehab  Picture Your Plate Total Score on Admission 63      Picture Your Plate Scores: <95 Unhealthy dietary pattern with much room for improvement. 41-50 Dietary pattern unlikely to meet recommendations for good health and room for improvement. 51-60 More healthful dietary pattern, with some room for improvement.  >60 Healthy dietary pattern, although there may be some specific behaviors that could be  improved.    Nutrition Goals Re-Evaluation:  Nutrition Goals Re-Evaluation     Row Name 07/20/23 1732 08/22/23 1735           Goals   Comment Joshua Leblanc states that he has been mindful of sodium intake. He does not add salt at the table and has started reading food labels. He reports drinking plenty of fluids. He does not drink sugary drinks but mostly flavored water with low sugar and sodium content. He was for the most part been avoiding fried food. Joshua Leblanc states that he continues to watch what he is eating and maintain a heart healthy diet. He does not eat fast food or fried foods anymore. He also has stopped adding salt to food.      Expected Outcome Short: continue to read food labels and watch sodium intake. Long: maintain heart healthy diet to control cardiac risk factors. Short: read food labels and continue to reduce sodium intake. continue to not eat fast food or fried food. Long: maintain heart health diet long term to help control cardiac risk factors.        Personal Goal #2 Re-Evaluation   Personal Goal #2 Continue to limit fried foods and fatty meats. Continue to limit fried foods and fatty meats.        Personal Goal #3 Re-Evaluation   Personal Goal #3 Continue to watch sodium and saturated fat Continue to watch sodium and saturated fat               Nutrition Goals Discharge (Final Nutrition Goals Re-Evaluation):  Nutrition Goals Re-Evaluation - 08/22/23 1735       Goals   Comment Joshua Leblanc states that he continues to watch what he is eating and maintain a heart healthy diet. He does not eat fast food or fried foods anymore. He also has stopped adding salt to food.    Expected Outcome Short: read food labels and continue to reduce sodium intake. continue to not eat fast food or fried food. Long: maintain heart health diet long term to help control cardiac risk factors.      Personal Goal #2 Re-Evaluation   Personal Goal #2 Continue to limit fried foods and fatty meats.       Personal Goal #3 Re-Evaluation   Personal Goal #3 Continue to watch sodium and saturated fat             Psychosocial: Target Goals: Acknowledge presence or absence of significant depression and/or stress, maximize coping skills, provide positive support system. Participant is able to verbalize  types and ability to use techniques and skills needed for reducing stress and depression.   Education: Stress, Anxiety, and Depression - Group verbal and visual presentation to define topics covered.  Reviews how body is impacted by stress, anxiety, and depression.  Also discusses healthy ways to reduce stress and to treat/manage anxiety and depression.  Written material given at graduation.   Education: Sleep Hygiene -Provides group verbal and written instruction about how sleep can affect your health.  Define sleep hygiene, discuss sleep cycles and impact of sleep habits. Review good sleep hygiene tips.    Initial Review & Psychosocial Screening:  Initial Psych Review & Screening - 06/08/23 1422       Initial Review   Current issues with Current Stress Concerns    Source of Stress Concerns Financial      Family Dynamics   Good Support System? Yes   family and girlfriend     Barriers   Psychosocial barriers to participate in program There are no identifiable barriers or psychosocial needs.;The patient should benefit from training in stress management and relaxation.      Screening Interventions   Interventions Encouraged to exercise;Provide feedback about the scores to participant;To provide support and resources with identified psychosocial needs    Expected Outcomes Short Term goal: Utilizing psychosocial counselor, staff and physician to assist with identification of specific Stressors or current issues interfering with healing process. Setting desired goal for each stressor or current issue identified.;Long Term Goal: Stressors or current issues are controlled or eliminated.;Short  Term goal: Identification and review with participant of any Quality of Life or Depression concerns found by scoring the questionnaire.;Long Term goal: The participant improves quality of Life and PHQ9 Scores as seen by post scores and/or verbalization of changes             Quality of Life Scores:   Quality of Life - 06/13/23 1533       Quality of Life   Select Quality of Life      Quality of Life Scores   Health/Function Pre 13.23 %    Socioeconomic Pre 16.19 %    Psych/Spiritual Pre 15.43 %    Family Pre 24.8 %    GLOBAL Pre 16 %            Scores of 19 and below usually indicate a poorer quality of life in these areas.  A difference of  2-3 points is a clinically meaningful difference.  A difference of 2-3 points in the total score of the Quality of Life Index has been associated with significant improvement in overall quality of life, self-image, physical symptoms, and general health in studies assessing change in quality of life.  PHQ-9: Review Flowsheet       06/13/2023 09/25/2015 05/12/2015  Depression screen PHQ 2/9  Decreased Interest 1 3 2   Down, Depressed, Hopeless 1 3 0  PHQ - 2 Score 2 6 2   Altered sleeping 1 3 1   Tired, decreased energy 1 3 3   Change in appetite 0 0 1  Feeling bad or failure about yourself  0 1 3  Trouble concentrating 0 0 0  Moving slowly or fidgety/restless 0 0 0  Suicidal thoughts 0 0 0  PHQ-9 Score 4 13 10   Difficult doing work/chores Somewhat difficult Somewhat difficult Not difficult at all   Interpretation of Total Score  Total Score Depression Severity:  1-4 = Minimal depression, 5-9 = Mild depression, 10-14 = Moderate depression, 15-19 = Moderately severe  depression, 20-27 = Severe depression   Psychosocial Evaluation and Intervention:  Psychosocial Evaluation - 06/08/23 1437       Psychosocial Evaluation & Interventions   Interventions Encouraged to exercise with the program and follow exercise prescription;Stress  management education;Relaxation education    Comments Joshua 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 He said this was a wake up call and has been making big lifestyle changes. He has his [employer], so he is on hold from working until he starts the program. He then will have to do a physical and undergo a stress test to be able to do his job fully. He states this has been a stressor being off of work since he supports himself. His family and girlfriend have been helpful in motivating him, but he is ready to get back to work and his normal routine. He has made a lot of diet changes and he has cut his smoking from 2 packs a day to about 5 cigarettes. He knows he needs to quit and is trying to. He has a history of anxiety and depression which he states is stable at this time. His biggest concern in getting back to work at full capacity and learning how to live a heart healthy lifestyle    Expected Outcomes Short: attend cardiac rehab for education and exercise. Long; Develop and maintain positive self care habits    Continue Psychosocial Services  Follow up required by staff             Psychosocial Re-Evaluation:  Psychosocial Re-Evaluation     Row Name 07/20/23 1747 08/11/23 1744           Psychosocial Re-Evaluation   Current issues with Current Stress Concerns Current Anxiety/Panic;Current Depression;Current Sleep Concerns      Comments Patient reports that he has no changes in his current stress, sleep, or mental health. He does have a support system with his girlfriend and sister who he can relay on for support. Joshua Leblanc reports that he has been dealing with increased anxiety/panic and depression related to his heart and newly discovered blockage. It has been affecting his sleep as he wakes up in a panic and is having fears of death due to this diagnosis. I provided Joshua Leblanc with our mental health resources in the area as he is seeking out treatment for these psychosocial issues.      Expected  Outcomes Short: continue to attend cardiac rehab to gain mental health benefits from exericse. Long: maintain good mental health habits. Short: Continue to attend cardiac rehab to gain mental health benefits from exercise and find counseling regarding his depression and anxiety. Long: Continue treatment related to depression and anxiety.      Interventions Encouraged to attend Cardiac Rehabilitation for the exercise Encouraged to attend Cardiac Rehabilitation for the exercise;Therapist referral  provided mental health resources      Continue Psychosocial Services  Follow up required by staff Follow up required by staff               Psychosocial Discharge (Final Psychosocial Re-Evaluation):  Psychosocial Re-Evaluation - 08/11/23 1744       Psychosocial Re-Evaluation   Current issues with Current Anxiety/Panic;Current Depression;Current Sleep Concerns    Comments Joshua Leblanc reports that he has been dealing with increased anxiety/panic and depression related to his heart and newly discovered blockage. It has been affecting his sleep as he wakes up in a panic and is having fears of death due to this diagnosis. I  provided Joshua Leblanc with our mental health resources in the area as he is seeking out treatment for these psychosocial issues.    Expected Outcomes Short: Continue to attend cardiac rehab to gain mental health benefits from exercise and find counseling regarding his depression and anxiety. Long: Continue treatment related to depression and anxiety.    Interventions Encouraged to attend Cardiac Rehabilitation for the exercise;Therapist referral   provided mental health resources   Continue Psychosocial Services  Follow up required by staff             Vocational Rehabilitation: Provide vocational rehab assistance to qualifying candidates.   Vocational Rehab Evaluation & Intervention:  Vocational Rehab - 06/13/23 1534       Initial Vocational Rehab Evaluation & Intervention   Assessment  shows need for Vocational Rehabilitation No             Education: Education Goals: Education classes will be provided on a variety of topics geared toward better understanding of heart health and risk factor modification. Participant will state understanding/return demonstration of topics presented as noted by education test scores.  Learning Barriers/Preferences:  Learning Barriers/Preferences - 06/08/23 1421       Learning Barriers/Preferences   Learning Barriers None    Learning Preferences None             General Cardiac Education Topics:  AED/CPR: - Group verbal and written instruction with the use of models to demonstrate the basic use of the AED with the basic ABC's of resuscitation.   Anatomy and Cardiac Procedures: - Group verbal and visual presentation and models provide information about basic cardiac anatomy and function. Reviews the testing methods done to diagnose heart disease and the outcomes of the test results. Describes the treatment choices: Medical Management, Angioplasty, or Coronary Bypass Surgery for treating various heart conditions including Myocardial Infarction, Angina, Valve Disease, and Cardiac Arrhythmias.  Written material given at graduation. Flowsheet Row Cardiac Rehab from 06/13/2023 in [facility] Cardiac and Pulmonary Rehab  Education need identified 06/13/23       Medication Safety: - Group verbal and visual instruction to review commonly prescribed medications for heart and lung disease. Reviews the medication, class of the drug, and side effects. Includes the steps to properly store meds and maintain the prescription regimen.  Written material given at graduation.   Intimacy: - Group verbal instruction through game format to discuss how heart and lung disease can affect sexual intimacy. Written material given at graduation..   Know Your Numbers and Heart Failure: - Group verbal and visual instruction to discuss disease risk factors for  cardiac and pulmonary disease and treatment options.  Reviews associated critical values for Overweight/Obesity, Hypertension, Cholesterol, and Diabetes.  Discusses basics of heart failure: signs/symptoms and treatments.  Introduces Heart Failure Zone chart for action plan for heart failure.  Written material given at graduation. Flowsheet Row Cardiac Rehab from 06/13/2023 in [facility] Cardiac and Pulmonary Rehab  Education need identified 06/13/23       Infection Prevention: - Provides verbal and written material to individual with discussion of infection control including proper hand washing and proper equipment cleaning during exercise session. Flowsheet Row Cardiac Rehab from 06/13/2023 in [facility] Cardiac [facility] Pulmonary Rehab  Date 06/13/23  Educator [facility]  Instruction Review Code 1- Verbalizes Understanding       Falls Prevention: - Provides verbal and written material to individual with discussion of falls prevention and safety. Flowsheet Row Cardiac Rehab from 06/13/2023 in [facility] Cardiac and Pulmonary Rehab  Date 06/13/23  Educator [facility]  Instruction Review Code 1- Verbalizes Understanding       Other: -Provides group and verbal instruction on various topics (see comments)   Knowledge Questionnaire Score:  Knowledge Questionnaire Score - 06/13/23 1534       Knowledge Questionnaire Score   Pre Score 22/26             Core Components/Risk Factors/Patient Goals at Admission:  Personal Goals and Risk Factors at Admission - 06/13/23 1526       Core Components/Risk Factors/Patient Goals on Admission    Weight Management Yes    Intervention Weight Management: Develop a combined nutrition and exercise program designed to reach desired caloric intake, while maintaining appropriate intake of nutrient and fiber, sodium and fats, and appropriate energy expenditure required for the weight goal.;Weight Management: Provide education and appropriate resources to help participant work on and  attain dietary goals.;Weight Management/Obesity: Establish reasonable short term and long term weight goals.    Admit Weight 179 lb 3.2 oz (81.3 kg)    Goal Weight: Short Term 179 lb (81.2 kg)    Goal Weight: Long Term 179 lb (81.2 kg)    Expected Outcomes Short Term: Continue to assess and modify interventions until short term weight is achieved;Long Term: Adherence to nutrition and physical activity/exercise program aimed toward attainment of established weight goal;Weight Maintenance: Understanding of the daily nutrition guidelines, which includes 25-35% calories from fat, 7% or less cal from saturated fats, less than 200mg  cholesterol, less than 1.5gm of sodium, & 5 or more servings of fruits and vegetables daily;Understanding recommendations for meals to include 15-35% energy as protein, 25-35% energy from fat, 35-60% energy from carbohydrates, less than 200mg  of dietary cholesterol, 20-35 gm of total fiber daily;Understanding of distribution of calorie intake throughout the day with the consumption of 4-5 meals/snacks    Tobacco Cessation Yes    Number of packs per day 1/2    Intervention Assist the participant in steps to quit. Provide individualized education and counseling about committing to Tobacco Cessation, relapse prevention, and pharmacological support that can be provided by physician.;Education officer, environmental, assist with locating and accessing local/national Quit Smoking programs, and support quit date choice.    Expected Outcomes Short Term: Will demonstrate readiness to quit, by selecting a quit date.;Short Term: Will quit all tobacco product use, adhering to prevention of relapse plan.;Long Term: Complete abstinence from all tobacco products for at least 12 months from quit date.    Intervention Provide education and support for participant on nutrition & aerobic/resistive exercise along with prescribed medications to achieve LDL 70mg , HDL >40mg .    Expected Outcomes Short Term:  Participant states understanding of desired cholesterol values and is compliant with medications prescribed. Participant is following exercise prescription and nutrition guidelines.;Long Term: Cholesterol controlled with medications as prescribed, with individualized exercise RX and with personalized nutrition plan. Value goals: LDL < 70mg , HDL > 40 mg.             Education:Diabetes - Individual verbal and written instruction to review signs/symptoms of diabetes, desired ranges of glucose level fasting, after meals and with exercise. Acknowledge that pre and post exercise glucose checks will be done for 3 sessions at entry of program.   Core Components/Risk Factors/Patient Goals Review:   Goals and Risk Factor Review     Row Name 06/13/23 1526 07/20/23 1743 08/22/23 1738         Core Components/Risk Factors/Patient Goals Review   Personal Goals Review  Tobacco Cessation Tobacco Cessation Tobacco Cessation;Lipids     Review Joshua Leblanc is a current tobacco user. Intervention for tobacco cessation was provided at the initial medical review. He was asked about readiness to quit and reported he has cut back to a half pack a day and is considering the steps he needs to take to quit smoking . Patient was advised and educated about tobacco cessation using combination therapy, tobacco cessation classes, quit line, and quit smoking apps. Patient demonstrated understanding of this material. Staff will continue to provide encouragement and follow up with the patient throughout the program. Joshua Leblanc reports that he is still smoking about 1/2 pack a day. He has in the past used the patch and been able to cut back more. He is looking possibly using the patch again to aid in trying to cut pack. Other smoking cessation options were discussed with patient. He is also planning to buy fake cigarettes to help with the hand to mouth habit of smoking. Joshua Leblanc reports that he continues to take all cholesterol meds as prescribed.  He follows up with his doctor regularly for required lab work. Joshua Leblanc has cut back to 8 cigarettes a day. He started by stopping smoking indoors, then his vehicle, and now he feels ready to quit. He did get the patch and plans to start using it tomorrow quit all together.     Expected Outcomes -- Short: start using the patch and fake cigarettes to help cut back on smoking. Long: become tobacco free. Short: start  using the patch and follow though with quit date, which is tomorrow 08/23/2023. Long: continue to control cholesterol and become tobacco free long term.              Core Components/Risk Factors/Patient Goals at Discharge (Final Review):   Goals and Risk Factor Review - 08/22/23 1738       Core Components/Risk Factors/Patient Goals Review   Personal Goals Review Tobacco Cessation;Lipids    Review Joshua Leblanc reports that he continues to take all cholesterol meds as prescribed. He follows up with his doctor regularly for required lab work. Joshua Leblanc has cut back to 8 cigarettes a day. He started by stopping smoking indoors, then his vehicle, and now he feels ready to quit. He did get the patch and plans to start using it tomorrow quit all together.    Expected Outcomes Short: start  using the patch and follow though with quit date, which is tomorrow 08/23/2023. Long: continue to control cholesterol and become tobacco free long term.             ITP Comments:  ITP Comments     Row Name 06/08/23 1429 06/13/23 1523 06/15/23 1723 06/29/23 1146 07/27/23 1006   ITP Comments Initial phone call completed. Diagnosis can be found in [facility] 10/12. EP Orientation scheduled for Monday 11/4 1:30.   Karreem is a current tobacco user. Intervention for tobacco cessation was provided at the initial medical review. He was asked about readiness to quit and reported that he is ready to start trying. He has reduced his smoking from 2 packs to 5 cigs a day . Patient was advised and educated about tobacco cessation using  combination therapy, tobacco cessation classes, quit line, and quit smoking apps. Patient demonstrated understanding of this material. Staff will continue to provide encouragement and follow up with the patient throughout the program. Completed and gym orientation. Initial ITP created and sent for review to Dr. Firman Hughes, Medical Director. Joshua Leblanc is a  current tobacco user. Intervention for tobacco cessation was provided at the initial medical review. He was asked about readiness to quit and reported he has cut back to a half pack a day and is considering the steps he needs to take to quit smoking . Patient was advised and educated about tobacco cessation using combination therapy, tobacco cessation classes, quit line, and quit smoking apps. Patient demonstrated understanding of this material. Staff will continue to provide encouragement and follow up with the patient throughout the program. First full day of exercise!  Patient was oriented to gym and equipment including functions, settings, policies, and procedures.  Patient's individual exercise prescription and treatment plan were reviewed.  All starting workloads were established based on the results of the 6 minute walk test done at initial orientation visit.  The plan for exercise progression was also introduced and progression will be customized based on patient's performance and goals. 30 Day review completed. Medical Director ITP review done, changes made as directed, and signed approval by Medical Director.    new to program 30 Day review completed. Medical Director ITP review done, changes made as directed, and signed approval by Medical Director.    Row Name 08/24/23 1313           ITP Comments 30 Day review completed. Medical Director ITP review done, changes made as directed, and signed approval by Medical Director.                Comments:

## 2023-08-25 ENCOUNTER — Ambulatory Visit: Payer: Self-pay

## 2023-08-29 ENCOUNTER — Ambulatory Visit: Payer: Self-pay

## 2023-08-31 ENCOUNTER — Encounter: Payer: BC Managed Care – PPO | Admitting: *Deleted

## 2023-08-31 DIAGNOSIS — I213 ST elevation (STEMI) myocardial infarction of unspecified site: Secondary | ICD-10-CM

## 2023-08-31 DIAGNOSIS — Z955 Presence of coronary angioplasty implant and graft: Secondary | ICD-10-CM

## 2023-08-31 DIAGNOSIS — Z48812 Encounter for surgical aftercare following surgery on the circulatory system: Secondary | ICD-10-CM | POA: Diagnosis not present

## 2023-08-31 NOTE — Progress Notes (Signed)
Daily Session Note  Patient Details  Name: Joshua Leblanc. MRN: [id number] Date of Birth: [birth year] Referring Provider:   Flowsheet Row Cardiac Rehab from 06/13/2023 in [facility] Cardiac and Pulmonary Rehab  Referring Provider Rita Ohara       Encounter Date: 08/31/2023  Check In:  Session Check In - 08/31/23 1724       Check-In   Supervising physician immediately available to respond to emergencies See telemetry face sheet for immediately available ER MD    Location [facility]-Cardiac & Pulmonary Rehab    Staff Present Susann Givens RN,BSN;Kelly Madilyn Fireman BS, ACSM CEP, Exercise Physiologist;Jason Wallace Cullens RDN,LDN    Virtual Visit No    Medication changes reported     No    Fall or balance concerns reported    No    Tobacco Cessation No Change    Current number of cigarettes/nicotine per day     10    Warm-up and Cool-down Performed on first and last piece of equipment    Resistance Training Performed Yes    VAD Patient? No    PAD/SET Patient? No      Pain Assessment   Currently in Pain? No/denies                Social History   Tobacco Use  Smoking Status Every Day   Current packs/day: 1.00   Average packs/day: 1 pack/day for 15.0 years (15.0 ttl pk-yrs)   Types: Cigarettes  Smokeless Tobacco Never    Goals Met:  Independence with exercise equipment Exercise tolerated well Personal goals reviewed No report of concerns or symptoms today Strength training completed today  Goals Unmet:  Not Applicable  Comments: Pt able to follow exercise prescription today without complaint.  Will continue to monitor for progression.    Dr. Bethann Punches is Medical Director for [facility] Cardiac Rehabilitation.  Dr. Vida Rigger is Medical Director for [facility] Pulmonary Rehabilitation.

## 2023-09-01 ENCOUNTER — Encounter: Payer: BC Managed Care – PPO | Admitting: *Deleted

## 2023-09-01 DIAGNOSIS — I213 ST elevation (STEMI) myocardial infarction of unspecified site: Secondary | ICD-10-CM

## 2023-09-01 DIAGNOSIS — Z955 Presence of coronary angioplasty implant and graft: Secondary | ICD-10-CM

## 2023-09-01 DIAGNOSIS — Z48812 Encounter for surgical aftercare following surgery on the circulatory system: Secondary | ICD-10-CM | POA: Diagnosis not present

## 2023-09-01 NOTE — Progress Notes (Signed)
Daily Session Note  Patient Details  Name: Joshua Leblanc. MRN: [id number] Date of Birth: 06/16/1976 Referring Provider:   Flowsheet Row Cardiac Rehab from 06/13/2023 in [facility] Cardiac and Pulmonary Rehab  Referring Provider Rita Ohara       Encounter Date: 09/01/2023  Check In:  Session Check In - 09/01/23 1712       Check-In   Supervising physician immediately available to respond to emergencies See telemetry face sheet for immediately available ER MD    Location [facility]-Cardiac & Pulmonary Rehab    Staff Present Susann Givens RN,BSN;Maxon Manya Silvas BS, Exercise Physiologist;Margaret Best, MS, Exercise Physiologist    Virtual Visit No    Medication changes reported     No    Fall or balance concerns reported    No    Current number of cigarettes/nicotine per day     12    Warm-up and Cool-down Performed on first and last piece of equipment    Resistance Training Performed Yes    VAD Patient? No    PAD/SET Patient? No                Social History   Tobacco Use  Smoking Status Every Day   Current packs/day: 1.00   Average packs/day: 1 pack/day for 15.0 years (15.0 ttl pk-yrs)   Types: Cigarettes  Smokeless Tobacco Never    Goals Met:  Independence with exercise equipment Exercise tolerated well No report of concerns or symptoms today Strength training completed today  Goals Unmet:  Not Applicable  Comments: Pt able to follow exercise prescription today without complaint.  Will continue to monitor for progression.    Dr. Bethann Punches is Medical Director for [facility] Cardiac Rehabilitation.  Dr. Vida Rigger is Medical Director for [facility] Pulmonary Rehabilitation.

## 2023-09-05 ENCOUNTER — Encounter: Payer: BC Managed Care – PPO | Admitting: *Deleted

## 2023-09-05 DIAGNOSIS — Z48812 Encounter for surgical aftercare following surgery on the circulatory system: Secondary | ICD-10-CM | POA: Diagnosis not present

## 2023-09-05 DIAGNOSIS — Z955 Presence of coronary angioplasty implant and graft: Secondary | ICD-10-CM

## 2023-09-05 DIAGNOSIS — I213 ST elevation (STEMI) myocardial infarction of unspecified site: Secondary | ICD-10-CM

## 2023-09-05 NOTE — Progress Notes (Signed)
Daily Session Note  Patient Details  Name: Joshua Leblanc. MRN: [id number] Date of Birth: 06/16/1976 Referring Provider:   Flowsheet Row Cardiac Rehab from 06/13/2023 in [facility] Cardiac and Pulmonary Rehab  Referring Provider Rita Ohara       Encounter Date: 09/05/2023  Check In:  Session Check In - 09/05/23 1712       Check-In   Supervising physician immediately available to respond to emergencies See telemetry face sheet for immediately available ER MD    Location [facility]-Cardiac & Pulmonary Rehab    Staff Present Tommye Standard BS, ACSM CEP, Exercise Physiologist;Moniqua Engebretsen Jewel Baize RN,BSN;Joseph Reino Kent RCP,RRT,BSRT    Virtual Visit No    Medication changes reported     No    Fall or balance concerns reported    No    Tobacco Cessation No Change    Current number of cigarettes/nicotine per day     12    Warm-up and Cool-down Performed on first and last piece of equipment    Resistance Training Performed Yes    VAD Patient? No    PAD/SET Patient? No      Pain Assessment   Currently in Pain? No/denies                Social History   Tobacco Use  Smoking Status Every Day   Current packs/day: 1.00   Average packs/day: 1 pack/day for 15.0 years (15.0 ttl pk-yrs)   Types: Cigarettes  Smokeless Tobacco Never    Goals Met:  Independence with exercise equipment Exercise tolerated well No report of concerns or symptoms today Strength training completed today  Goals Unmet:  Not Applicable  Comments: Pt able to follow exercise prescription today without complaint.  Will continue to monitor for progression.    Dr. Bethann Punches is Medical Director for [facility] Cardiac Rehabilitation.  Dr. Vida Rigger is Medical Director for [facility] Pulmonary Rehabilitation.

## 2023-09-07 ENCOUNTER — Ambulatory Visit: Payer: Self-pay

## 2023-09-12 ENCOUNTER — Encounter: Payer: BC Managed Care – PPO | Attending: Cardiology | Admitting: *Deleted

## 2023-09-12 DIAGNOSIS — Z955 Presence of coronary angioplasty implant and graft: Secondary | ICD-10-CM | POA: Diagnosis present

## 2023-09-12 DIAGNOSIS — I213 ST elevation (STEMI) myocardial infarction of unspecified site: Secondary | ICD-10-CM | POA: Diagnosis present

## 2023-09-12 NOTE — Progress Notes (Signed)
Daily Session Note  Patient Details  Name: Joshua Leblanc. MRN: [id number] Date of Birth: [birth year] Referring Provider:   Flowsheet Row Cardiac Rehab from 06/13/2023 in [facility] Cardiac and Pulmonary Rehab  Referring Provider Rita Ohara       Encounter Date: 09/12/2023  Check In:  Session Check In - 09/12/23 1729       Check-In   Supervising physician immediately available to respond to emergencies See telemetry face sheet for immediately available ER MD    Location [facility]-Cardiac & Pulmonary Rehab    Staff Present Susann Givens RN,BSN;Joseph [facility] [place] BS, ACSM CEP, Exercise Physiologist    Virtual Visit No    Medication changes reported     No    Fall or balance concerns reported    No    Tobacco Cessation Use Increase    Current number of cigarettes/nicotine per day     14    Warm-up and Cool-down Performed on first and last piece of equipment    Resistance Training Performed Yes    VAD Patient? No    PAD/SET Patient? No      Pain Assessment   Currently in Pain? No/denies                Social History   Tobacco Use  Smoking Status Every Day   Current packs/day: 1.00   Average packs/day: 1 pack/day for 15.0 years (15.0 ttl pk-yrs)   Types: Cigarettes  Smokeless Tobacco Never    Goals Met:  Independence with exercise equipment Exercise tolerated well No report of concerns or symptoms today Strength training completed today  Goals Unmet:  Not Applicable  Comments: Pt able to follow exercise prescription today without complaint.  Will continue to monitor for progression.    Dr. Bethann Punches is Medical Director for [facility] Cardiac Rehabilitation.  Dr. Vida Rigger is Medical Director for [facility] Pulmonary Rehabilitation.

## 2023-09-13 ENCOUNTER — Other Ambulatory Visit: Payer: Self-pay

## 2023-09-13 MED ORDER — ASPIRIN 81 MG PO TBEC
81.0000 mg | DELAYED_RELEASE_TABLET | Freq: Every day | ORAL | 0 refills | Status: AC
  Filled 2023-09-13: qty 30, 30d supply, fill #0

## 2023-09-13 MED ORDER — PRASUGREL HCL 10 MG PO TABS
10.0000 mg | ORAL_TABLET | Freq: Every day | ORAL | 5 refills | Status: AC
  Filled 2023-09-13: qty 30, 30d supply, fill #0

## 2023-09-13 MED ORDER — METOPROLOL SUCCINATE ER 25 MG PO TB24
25.0000 mg | ORAL_TABLET | Freq: Every day | ORAL | 5 refills | Status: AC
  Filled 2023-09-13: qty 30, 30d supply, fill #0

## 2023-09-13 MED ORDER — ATORVASTATIN CALCIUM 80 MG PO TABS
80.0000 mg | ORAL_TABLET | Freq: Every day | ORAL | 5 refills | Status: AC
  Filled 2023-09-13: qty 30, 30d supply, fill #0

## 2023-09-15 ENCOUNTER — Encounter: Payer: BC Managed Care – PPO | Admitting: *Deleted

## 2023-09-15 DIAGNOSIS — Z955 Presence of coronary angioplasty implant and graft: Secondary | ICD-10-CM

## 2023-09-15 DIAGNOSIS — I213 ST elevation (STEMI) myocardial infarction of unspecified site: Secondary | ICD-10-CM | POA: Diagnosis not present

## 2023-09-15 NOTE — Progress Notes (Signed)
 Daily Session Note  Patient Details  Name: Joshua Leblanc. MRN: [id number] Date of Birth: 11-Apr-1976 Referring Provider:   Flowsheet Row Cardiac Rehab from 06/13/2023 in [facility] Cardiac and Pulmonary Rehab  Referring Provider Marsa Loge       Encounter Date: 09/15/2023  Check In:  Session Check In - 09/15/23 1709       Check-In   Supervising physician immediately available to respond to emergencies See telemetry face sheet for immediately available ER MD    Location [facility]-Cardiac & Pulmonary Rehab    Staff Present Maxon Conetta BS, Exercise Physiologist;Arlee Santosuosso Tressa RN,BSN;Joseph Rolinda RCP,RRT,BSRT    Virtual Visit No    Medication changes reported     Yes    Comments added gabapentin and metoprolol     Fall or balance concerns reported    No    Tobacco Cessation Use Increase    Current number of cigarettes/nicotine  per day     15    Warm-up and Cool-down Performed on first and last piece of equipment    Resistance Training Performed Yes    VAD Patient? No    PAD/SET Patient? No      Pain Assessment   Currently in Pain? No/denies                Social History   Tobacco Use  Smoking Status Every Day   Current packs/day: 1.00   Average packs/day: 1 pack/day for 15.0 years (15.0 ttl pk-yrs)   Types: Cigarettes  Smokeless Tobacco Never    Goals Met:  Independence with exercise equipment Exercise tolerated well No report of concerns or symptoms today Strength training completed today  Goals Unmet:  Not Applicable  Comments: Pt able to follow exercise prescription today without complaint.  Will continue to monitor for progression.    Dr. Oneil Pinal is Medical Director for [facility] Cardiac Rehabilitation.  Dr. Fuad Aleskerov is Medical Director for [facility] Pulmonary Rehabilitation.

## 2023-09-19 ENCOUNTER — Encounter: Payer: BC Managed Care – PPO | Admitting: *Deleted

## 2023-09-19 DIAGNOSIS — I213 ST elevation (STEMI) myocardial infarction of unspecified site: Secondary | ICD-10-CM | POA: Diagnosis not present

## 2023-09-19 DIAGNOSIS — Z955 Presence of coronary angioplasty implant and graft: Secondary | ICD-10-CM

## 2023-09-19 NOTE — Progress Notes (Signed)
 Daily Session Note  Patient Details  Name: Joshua Leblanc. MRN: [id number] Date of Birth: 04-10-1976 Referring Provider:   Flowsheet Row Cardiac Rehab from 06/13/2023 in [facility] Cardiac and Pulmonary Rehab  Referring Provider Barbar Levine       Encounter Date: 09/19/2023  Check In:  Session Check In - 09/19/23 1706       Check-In   Supervising physician immediately available to respond to emergencies See telemetry face sheet for immediately available ER MD    Location [facility]-Cardiac & Pulmonary Rehab    Staff Present Sue Em RN,BSN;Joseph [facility] BS, ACSM CEP, Exercise Physiologist    Virtual Visit No    Medication changes reported     No    Fall or balance concerns reported    No    Tobacco Cessation No Change    Current number of cigarettes/nicotine  per day     7    Warm-up and Cool-down Performed on first and last piece of equipment    Resistance Training Performed Yes    VAD Patient? No    PAD/SET Patient? No      Pain Assessment   Currently in Pain? No/denies                Social History   Tobacco Use  Smoking Status Every Day   Current packs/day: 1.00   Average packs/day: 1 pack/day for 15.0 years (15.0 ttl pk-yrs)   Types: Cigarettes  Smokeless Tobacco Never    Goals Met:  Independence with exercise equipment Exercise tolerated well No report of concerns or symptoms today  Goals Unmet:  Not Applicable  Comments: Pt able to follow exercise prescription today without complaint.  Will continue to monitor for progression.    Dr. Firman Hughes is Medical Director for [facility] Cardiac Rehabilitation.  Dr. Fuad Aleskerov is Medical Director for [facility] Pulmonary Rehabilitation.

## 2023-09-21 ENCOUNTER — Encounter: Payer: Self-pay | Admitting: *Deleted

## 2023-09-21 DIAGNOSIS — Z955 Presence of coronary angioplasty implant and graft: Secondary | ICD-10-CM

## 2023-09-21 DIAGNOSIS — I213 ST elevation (STEMI) myocardial infarction of unspecified site: Secondary | ICD-10-CM

## 2023-09-21 NOTE — Progress Notes (Signed)
Cardiac Individual Treatment Plan  Patient Details  Name: Ponciano Shealy. MRN: [id number] Date of Birth: March 09, 1976 Referring Provider:   Flowsheet Row Cardiac Rehab from 06/13/2023 in [facility] Cardiac and Pulmonary Rehab  Referring Provider Rita Ohara       Initial Encounter Date:  Flowsheet Row Cardiac Rehab from 06/13/2023 in [facility] Cardiac and Pulmonary Rehab  Date 06/13/23       Visit Diagnosis: ST elevation myocardial infarction (STEMI), unspecified artery [facility])  Status post coronary artery stent placement  Patient's Home Medications on Admission:  Current Outpatient Medications:    aspirin EC (ASPIRIN 81) 81 MG tablet, Take 1 tablet (81 mg total) by mouth daily., Disp: 60 tablet, Rfl: 0   aspirin EC (ASPIRIN 81) 81 MG tablet, Take 1 tablet (81 mg total) by mouth daily., Disp: 60 tablet, Rfl: 0   aspirin EC 81 MG tablet, Take 1 tablet (81 mg total) by mouth daily. Swallow whole., Disp: 60 tablet, Rfl: 0   atorvastatin (LIPITOR) 80 MG tablet, Take 1 tablet (80 mg total) by mouth daily., Disp: 30 tablet, Rfl: 5   atorvastatin (LIPITOR) 80 MG tablet, Take 1 tablet (80 mg total) by mouth once daily, Disp: 30 tablet, Rfl: 5   atorvastatin (LIPITOR) 80 MG tablet, Take 1 tablet (80 mg total) by mouth daily., Disp: 30 tablet, Rfl: 5   atorvastatin (LIPITOR) 80 MG tablet, Take 1 tablet (80 mg total) by mouth daily., Disp: 30 tablet, Rfl: 5   atorvastatin (LIPITOR) 80 MG tablet, Take 1 tablet (80 mg total) by mouth daily., Disp: 30 tablet, Rfl: 5   cephALEXin (KEFLEX) 500 MG capsule, Take by mouth., Disp: , Rfl:    diazepam (VALIUM) 5 MG tablet, Take 0.5 to 1 tab every 8 hours as needed for anxiety., Disp: , Rfl:    FLUoxetine (PROZAC) 20 MG capsule, Take by mouth., Disp: , Rfl:    lisinopril (ZESTRIL) 2.5 MG tablet, Take 1 tablet (2.5 mg total) by mouth daily., Disp: 30 tablet, Rfl: 5   lisinopril (ZESTRIL) 2.5 MG tablet, Take 1 tablet (2.5 mg total) by mouth once daily,  Disp: 30 tablet, Rfl: 5   lisinopril (ZESTRIL) 2.5 MG tablet, Take 1 tablet (2.5 mg total) by mouth daily., Disp: 30 tablet, Rfl: 5   lisinopril (ZESTRIL) 2.5 MG tablet, Take 1 tablet (2.5 mg total) by mouth daily., Disp: 30 tablet, Rfl: 5   metoprolol succinate (TOPROL-XL) 25 MG 24 hr tablet, Take 1 tablet (25 mg total) by mouth once daily., Disp: 30 tablet, Rfl: 5   metoprolol succinate (TOPROL-XL) 25 MG 24 hr tablet, Take 1 tablet (25 mg total) by mouth once daily, Disp: 30 tablet, Rfl: 5   metoprolol succinate (TOPROL-XL) 25 MG 24 hr tablet, Take 1 tablet (25 mg total) by mouth daily., Disp: 30 tablet, Rfl: 5   metoprolol succinate (TOPROL-XL) 25 MG 24 hr tablet, Take 1 tablet (25 mg total) by mouth daily., Disp: 30 tablet, Rfl: 5   metoprolol succinate (TOPROL-XL) 25 MG 24 hr tablet, Take 1 tablet (25 mg total) by mouth once daily, Disp: 30 tablet, Rfl: 5   nicotine (NICODERM CQ - DOSED IN MG/24 HOURS) 21 mg/24hr patch, Place 1 patch (21 mg total) onto the skin daily., Disp: , Rfl:    prasugrel (EFFIENT) 10 MG TABS tablet, Take 1 tablet (10 mg total) by mouth once daily., Disp: 30 tablet, Rfl: 5   prasugrel (EFFIENT) 10 MG TABS tablet, Take 1 tablet (10 mg total) by mouth  once daily, Disp: 30 tablet, Rfl: 5   prasugrel (EFFIENT) 10 MG TABS tablet, Take 1 tablet (10 mg total) by mouth daily., Disp: 30 tablet, Rfl: 5   prasugrel (EFFIENT) 10 MG TABS tablet, Take 1 tablet (10 mg total) by mouth daily., Disp: 30 tablet, Rfl: 5   prasugrel (EFFIENT) 10 MG TABS tablet, Take 1 tablet (10 mg total) by mouth once daily, Disp: 30 tablet, Rfl: 5   spironolactone (ALDACTONE) 25 MG tablet, Take 0.5 tablets (12.5 mg total) by mouth daily., Disp: 30 tablet, Rfl: 0  Past Medical History: Past Medical History:  Diagnosis Date   Anxiety    Depression    Headache    Neck pain     Tobacco Use: Social History   Tobacco Use  Smoking Status Every Day   Current packs/day: 1.00   Average packs/day: 1  pack/day for 15.0 years (15.0 ttl pk-yrs)   Types: Cigarettes  Smokeless Tobacco Never    Labs: Review Flowsheet       Latest Ref Rng & Units 05/21/2023  Labs for ITP Cardiac and Pulmonary Rehab  Cholestrol 0 - 200 mg/dL 324   LDL (calc) 0 - 99 mg/dL 94   HDL-C >40 mg/dL 32   Trlycerides <102 mg/dL 725   Hemoglobin D6U 4.8 - 5.6 % 5.4      Exercise Target Goals: Exercise Program Goal: Individual exercise prescription set using results from initial 6 min walk test and THRR while considering  patient's activity barriers and safety.   Exercise Prescription Goal: Initial exercise prescription builds to 30-45 minutes a day of aerobic activity, 2-3 days per week.  Home exercise guidelines will be given to patient during program as part of exercise prescription that the participant will acknowledge.   Education: Aerobic Exercise: - Group verbal and visual presentation on the components of exercise prescription. Introduces F.I.T.T principle from ACSM for exercise prescriptions.  Reviews F.I.T.T. principles of aerobic exercise including progression. Written material given at graduation.   Education: Resistance Exercise: - Group verbal and visual presentation on the components of exercise prescription. Introduces F.I.T.T principle from ACSM for exercise prescriptions  Reviews F.I.T.T. principles of resistance exercise including progression. Written material given at graduation.    Education: Exercise & Equipment Safety: - Individual verbal instruction and demonstration of equipment use and safety with use of the equipment. Flowsheet Row Cardiac Rehab from 06/13/2023 in [facility] Cardiac and Pulmonary Rehab  Date 06/13/23  Educator [facility]  Instruction Review Code 1- Verbalizes Understanding       Education: Exercise Physiology & General Exercise Guidelines: - Group verbal and written instruction with models to review the exercise physiology of the cardiovascular system and associated  critical values. Provides general exercise guidelines with specific guidelines to those with heart or lung disease.    Education: Flexibility, Balance, Mind/Body Relaxation: - Group verbal and visual presentation with interactive activity on the components of exercise prescription. Introduces F.I.T.T principle from ACSM for exercise prescriptions. Reviews F.I.T.T. principles of flexibility and balance exercise training including progression. Also discusses the mind body connection.  Reviews various relaxation techniques to help reduce and manage stress (i.e. Deep breathing, progressive muscle relaxation, and visualization). Balance handout provided to take home. Written material given at graduation.   Activity Barriers & Risk Stratification:  Activity Barriers & Cardiac Risk Stratification - 06/13/23 1535       Activity Barriers & Cardiac Risk Stratification   Activity Barriers None    Cardiac Risk Stratification Moderate  6 Minute Walk:  6 Minute Walk     Row Name 06/13/23 1527         6 Minute Walk   Phase Initial     Distance 1280 feet     Walk Time 6 minutes     # of Rest Breaks 0     MPH 2.42     METS 4.3     RPE 9     Perceived Dyspnea  0     VO2 Peak 15.1     Symptoms No     Resting HR 70 bpm     Resting BP 112/62     Resting Oxygen Saturation  99 %     Exercise Oxygen Saturation  during 6 min walk 98 %     Max Ex. HR 88 bpm     Max Ex. BP 146/70     2 Minute Post BP 120/70              Oxygen Initial Assessment:   Oxygen Re-Evaluation:   Oxygen Discharge (Final Oxygen Re-Evaluation):   Initial Exercise Prescription:  Initial Exercise Prescription - 06/13/23 1500       Date of Initial Exercise RX and Referring Provider   Date 06/13/23    Referring Provider Rita Ohara      Oxygen   Maintain Oxygen Saturation 88% or higher      Treadmill   MPH 2.7    Grade 1    Minutes 15    METs 4.3      NuStep   Level 3     SPM 80    Minutes 15    METs 4.3      Elliptical   Level 1    Speed 3    Minutes 15    METs 4.3      REL-XR   Level 3    Speed 50    Minutes 15    METs 4.3      T5 Nustep   Level 3    SPM 80    Minutes 15    METs 4.3      Prescription Details   Frequency (times per week) 3    Duration Progress to 30 minutes of continuous aerobic without signs/symptoms of physical distress      Intensity   THRR 40-80% of Max Heartrate 111-153    Ratings of Perceived Exertion 11-13    Perceived Dyspnea 0-4      Progression   Progression Continue to progress workloads to maintain intensity without signs/symptoms of physical distress.      Resistance Training   Training Prescription Yes    Weight 4    Reps 10-15             Perform Capillary Blood Glucose checks as needed.  Exercise Prescription Changes:   Exercise Prescription Changes     Row Name 06/13/23 1500 06/30/23 1400 07/06/23 1700 07/12/23 1400 07/27/23 1400     Response to Exercise   Blood Pressure (Admit) 112/62 120/60 -- 108/72 120/70   Blood Pressure (Exercise) 146/70 150/80 -- 146/70 142/70   Blood Pressure (Exit) 120/70 110/70 -- 104/70 104/64   Heart Rate (Admit) 70 bpm 69 bpm -- 80 bpm 86 bpm   Heart Rate (Exercise) 88 bpm 122 bpm -- 111 bpm 127 bpm   Heart Rate (Exit) 71 bpm 87 bpm -- 89 bpm 91 bpm   Oxygen Saturation (Admit) 99 % -- -- -- --  Oxygen Saturation (Exercise) 98 % -- -- -- --   Oxygen Saturation (Exit) 98 % -- -- -- --   Rating of Perceived Exertion (Exercise) 9 13 -- 13 13   Perceived Dyspnea (Exercise) 0 -- -- 0 0   Symptoms none none -- none none   Comments 6 MWT results First full week of exercise -- -- --   Duration -- Continue with 30 min of aerobic exercise without signs/symptoms of physical distress. -- Progress to 30 minutes of  aerobic without signs/symptoms of physical distress Progress to 30 minutes of  aerobic without signs/symptoms of physical distress   Intensity -- THRR  unchanged THRR unchanged THRR unchanged THRR unchanged     Progression   Progression -- Continue to progress workloads to maintain intensity without signs/symptoms of physical distress. Continue to progress workloads to maintain intensity without signs/symptoms of physical distress. Continue to progress workloads to maintain intensity without signs/symptoms of physical distress. Continue to progress workloads to maintain intensity without signs/symptoms of physical distress.   Average METs -- 3.84 3.84 3.75 3.93     Resistance Training   Training Prescription -- Yes Yes Yes Yes   Weight -- 4 lb 4 lb 8 lb 8 lb   Reps -- 10-15 10-15 10-15 10-15     Interval Training   Interval Training -- No No No No     Treadmill   MPH -- 2.7 2.7 3 4    Grade -- 1 1 0.5 0.5   Minutes -- 15 15 15 15    METs -- 3.44 3.44 3.5 4.34     NuStep   Level -- 5 5 -- 5   Minutes -- 15 15 -- 15   METs -- 4.9 4.9 -- 4.6     REL-XR   Level -- 5 5 7 5    Minutes -- 15 15 15 15    METs -- 4.8 4.8 -- --     T5 Nustep   Level -- -- -- -- 5   Minutes -- -- -- -- 15   METs -- -- -- -- 3.9     Biostep-RELP   Level -- -- -- 4 --   Minutes -- -- -- 15 --   METs -- -- -- 4 --     Home Exercise Plan   Plans to continue exercise at -- -- Home (comment)  walking and stationary bike Home (comment)  walking and stationary bike Home (comment)  walking and stationary bike   Frequency -- -- Add 2 additional days to program exercise sessions. Add 2 additional days to program exercise sessions. Add 2 additional days to program exercise sessions.   Initial Home Exercises Provided -- -- 07/06/23 07/06/23 07/06/23     Oxygen   Maintain Oxygen Saturation -- 88% or higher 88% or higher 88% or higher 88% or higher    Row Name 08/11/23 1200 08/25/23 0700 09/07/23 1100 09/20/23 1500       Response to Exercise   Blood Pressure (Admit) 122/76 120/70 112/62 104/60    Blood Pressure (Exit) 106/60 100/60 114/60 106/60    Heart  Rate (Admit) 69 bpm 73 bpm 93 bpm 80 bpm    Heart Rate (Exercise) 121 bpm 93 bpm 106 bpm 120 bpm    Heart Rate (Exit) 91 bpm 86 bpm 90 bpm 95 bpm    Oxygen Saturation (Admit) 98 % 98 % 97 % 96 %    Oxygen Saturation (Exercise) 93 % 96 % 92 % 92 %  Oxygen Saturation (Exit) 93 % 96 % 96 % 93 %    Rating of Perceived Exertion (Exercise) 13 15 13 14     Perceived Dyspnea (Exercise) 0 -- 0 0    Symptoms none leg pain none none    Duration Progress to 30 minutes of  aerobic without signs/symptoms of physical distress Continue with 30 min of aerobic exercise without signs/symptoms of physical distress. Continue with 30 min of aerobic exercise without signs/symptoms of physical distress. Continue with 30 min of aerobic exercise without signs/symptoms of physical distress.    Intensity THRR unchanged THRR unchanged THRR unchanged THRR unchanged      Progression   Progression Continue to progress workloads to maintain intensity without signs/symptoms of physical distress. Continue to progress workloads to maintain intensity without signs/symptoms of physical distress. Continue to progress workloads to maintain intensity without signs/symptoms of physical distress. Continue to progress workloads to maintain intensity without signs/symptoms of physical distress.    Average METs 4.28 3.25 3.82 3.97      Resistance Training   Training Prescription Yes Yes Yes Yes    Weight 8 lb 8 lb 8 lb 8 lb    Reps 10-15 10-15 10-15 10-15      Interval Training   Interval Training No No No No      Treadmill   MPH 3 3 3.6 3.6    Grade 0.5 0.5 1.5 1.5    Minutes 15 15 15 15     METs 3.5 3.5 4.5 4.5      NuStep   Level 7 -- 5 7    Minutes 15 -- 15 15    METs 6.3 -- 3.7 5.3      T5 Nustep   Level 7  T6 6 6 6     Minutes 15 15 15 15     METs 4 3 3.6 3.6      Biostep-RELP   Level -- -- -- 5    Minutes -- -- -- 15    METs -- -- -- 3      Home Exercise Plan   Plans to continue exercise at Home (comment)   walking and stationary bike Home (comment)  walking and stationary bike Home (comment)  walking and stationary bike Home (comment)  walking and stationary bike    Frequency Add 2 additional days to program exercise sessions. Add 2 additional days to program exercise sessions. Add 2 additional days to program exercise sessions. Add 2 additional days to program exercise sessions.    Initial Home Exercises Provided 07/06/23 07/06/23 07/06/23 07/06/23      Oxygen   Maintain Oxygen Saturation 88% or higher 88% or higher 88% or higher 88% or higher             Exercise Comments:   Exercise Comments     Row Name 06/15/23 1723           Exercise Comments First full day of exercise!  Patient was oriented to gym and equipment including functions, settings, policies, and procedures.  Patient's individual exercise prescription and treatment plan were reviewed.  All starting workloads were established based on the results of the 6 minute walk test done at initial orientation visit.  The plan for exercise progression was also introduced and progression will be customized based on patient's performance and goals.                Exercise Goals and Review:   Exercise Goals     Row Name  06/13/23 1531             Exercise Goals   Increase Physical Activity Yes       Intervention Provide advice, education, support and counseling about physical activity/exercise needs.;Develop an individualized exercise prescription for aerobic and resistive training based on initial evaluation findings, risk stratification, comorbidities and participant's personal goals.       Expected Outcomes Short Term: Attend rehab on a regular basis to increase amount of physical activity.;Long Term: Add in home exercise to make exercise part of routine and to increase amount of physical activity.;Long Term: Exercising regularly at least 3-5 days a week.       Increase Strength and Stamina Yes       Intervention Provide  advice, education, support and counseling about physical activity/exercise needs.;Develop an individualized exercise prescription for aerobic and resistive training based on initial evaluation findings, risk stratification, comorbidities and participant's personal goals.       Expected Outcomes Short Term: Increase workloads from initial exercise prescription for resistance, speed, and METs.;Short Term: Perform resistance training exercises routinely during rehab and add in resistance training at home;Long Term: Improve cardiorespiratory fitness, muscular endurance and strength as measured by increased METs and functional capacity ( )       Able to understand and use rate of perceived exertion (RPE) scale Yes       Intervention Provide education and explanation on how to use RPE scale       Expected Outcomes Short Term: Able to use RPE daily in rehab to express subjective intensity level;Long Term:  Able to use RPE to guide intensity level when exercising independently       Able to understand and use Dyspnea scale Yes       Intervention Provide education and explanation on how to use Dyspnea scale       Expected Outcomes Short Term: Able to use Dyspnea scale daily in rehab to express subjective sense of shortness of breath during exertion;Long Term: Able to use Dyspnea scale to guide intensity level when exercising independently       Knowledge and understanding of Target Heart Rate Range (THRR) Yes       Intervention Provide education and explanation of THRR including how the numbers were predicted and where they are located for reference       Expected Outcomes Short Term: Able to state/look up THRR;Long Term: Able to use THRR to govern intensity when exercising independently;Short Term: Able to use daily as guideline for intensity in rehab       Able to check pulse independently Yes       Intervention Provide education and demonstration on how to check pulse in carotid and radial arteries.;Review  the importance of being able to check your own pulse for safety during independent exercise       Expected Outcomes Short Term: Able to explain why pulse checking is important during independent exercise;Long Term: Able to check pulse independently and accurately       Understanding of Exercise Prescription Yes       Intervention Provide education, explanation, and written materials on patient's individual exercise prescription       Expected Outcomes Short Term: Able to explain program exercise prescription;Long Term: Able to explain home exercise prescription to exercise independently                Exercise Goals Re-Evaluation :  Exercise Goals Re-Evaluation     Row Name 06/15/23 1724 06/30/23 1429 07/06/23  1733 07/12/23 1456 07/27/23 1440     Exercise Goal Re-Evaluation   Exercise Goals Review Increase Physical Activity;Able to understand and use rate of perceived exertion (RPE) scale;Knowledge and understanding of Target Heart Rate Range (THRR);Understanding of Exercise Prescription;Increase Strength and Stamina;Able to check pulse independently Increase Physical Activity;Increase Strength and Stamina;Understanding of Exercise Prescription Increase Physical Activity;Able to understand and use rate of perceived exertion (RPE) scale;Knowledge and understanding of Target Heart Rate Range (THRR);Understanding of Exercise Prescription;Increase Strength and Stamina;Able to understand and use Dyspnea scale;Able to check pulse independently Increase Physical Activity;Increase Strength and Stamina;Understanding of Exercise Prescription Increase Physical Activity;Increase Strength and Stamina;Understanding of Exercise Prescription   Comments Reviewed RPE and dyspnea scale, THR and program prescription with pt today.  Pt voiced understanding and was given a copy of goals to take home. Yavuz is off to a good start in the program. He did well on the treadmill at a speed of 2.7 mph with an incline of 1%.  He also improved to level 5 on both the XR and T4 nustep in just his first week of rehab! We will continue to monitor his progress in the program. Reviewed home exercise with pt today.  Pt plans to walk and use a stationary bike for exercise.  Reviewed THR, pulse, RPE, sign and symptoms, pulse oximetery and when to call 911 or MD.  Also discussed weather considerations and indoor options.  Pt voiced understanding. Reita Cliche is doing well in rehab. He has been able to increase his handweights to 8lb, and level on the XR to level 7. He was also able to increase his speed on the treadmill from 2.7 mph to 3 mph. We will continue to monitor his progress in the program. Reita Cliche continues to do well in rehab. He has been able to increase his level on the T5 nustep from level 3 to level 5. He was also able to increase his speed on the treadmill from 3 to 4 mph. We will continue to monitor his progress in the program.   Expected Outcomes Short: Use RPE daily to regulate intensity.  Long: Follow program prescription in THR. Short: Continue to follow current exercise prescription. Long: Continue exercise to improve strength and stamina. Short: buy a stationary bike and add 1-2 days a week of exercise at home on off days of rehab. Long: become independent with exercise routine. Short: Continue to follow current exercise prescription. Long: Continue exercise to improve strength and stamina. Short: Continue to follow current exercise prescription. Long: Continue exercise to improve strength and stamina.    Row Name 08/11/23 1224 08/22/23 1730 08/25/23 0756 08/31/23 1747 09/07/23 1123     Exercise Goal Re-Evaluation   Exercise Goals Review Increase Physical Activity;Increase Strength and Stamina;Understanding of Exercise Prescription Increase Strength and Stamina;Understanding of Exercise Prescription Increase Strength and Stamina;Understanding of Exercise Prescription;Increase Physical Activity Increase Physical Activity;Increase  Strength and Stamina;Understanding of Exercise Prescription;Improve claudication pain tolerance and improve walking ability Increase Physical Activity;Increase Strength and Stamina;Understanding of Exercise Prescription   Comments Reita Cliche is doing well in rehab. He was able to increase to level 7 on both the T6 and T4 nustep. He did however slightly decline in workload on the treadmill, but we will continue to encourage and monitor his progression. Reita Cliche has had a hard time progressing with his workloads as he would like due to leg pain. He will be getting a stent put in leg tomorrow to help increase blood flow and hopefully reduce pain. He will be out  the remainder of the week and plans to start back next week. He is also considering buying a treadmill for at home use to be able to increase to 4-5 days a week of exercise. Reita Cliche attended one session since the last review. He had a workload of a speed of with an incline of 0.5% on the treadmill and exercised at level 6 on the T5 nustep. Reita Cliche has been having issues with leg pain, which is interferring with wanting to progress his workloads, and he is getting a stent in his leg this week in hopes to reduce pain and increase blood flow. We will continue to monitor his progress in the program. Reita Cliche had a stent placed in his leg last week due to a great amount of PAD pain he experienced often with activity. This was his first exercise session since the stent placement and he was happy to report that he was having no pain in his leg while walking. His PAD pain had been his main limiting factor in exercise progression. Now that he is not having that pain he should be able to progress more with his workloads to help him increase his strength and stamina with respect to his cardiovascular fitness. Reita Cliche is doing well in rehab. He is doing much better after his stent procedure. He was able to increase his speed and his incline on the treadmill to 3. and 1.5% grade.  We will continue to monitor his progress in the program.   Expected Outcomes Short: Increase treadmill workload to previous levels. Long: Continue exercise to increase strength and stamina. Short: get stent put in leg this week. Long: reduced leg pain will help Bobby to be able to increase his intensities more efficiently to make the gains he wants to see in his cardiovascular fitness. Short: Continue to progressively increase treadmill and nustep workloads once leg pain is reduced. Long: Continue exercise to improve strength and stamina. Short: begin to progress with workload intensities since pain in leg seems to have been resolved with stent placement. Long: contine to gain strength and stamina and become independent with exercise routine. Short: Continue to increase treadmill workloads. Long: Continue exercise to increase strength and stamina.    Row Name 09/20/23 1538             Exercise Goal Re-Evaluation   Exercise Goals Review Increase Physical Activity;Increase Strength and Stamina;Understanding of Exercise Prescription       Comments Reita Cliche continues to do well in rehab. He was able to increase his level on the biostep from 4 to 5. He was also able to maintain both his speed and incline on the treadmill, as well as his workload on the T4 nustep. We will continue to monitor his progress in the program.       Expected Outcomes Short: Continue to increase treadmill workloads. Long: Continue exercise to increase strength and stamina.                Discharge Exercise Prescription (Final Exercise Prescription Changes):  Exercise Prescription Changes - 09/20/23 1500       Response to Exercise   Blood Pressure (Admit) 104/60    Blood Pressure (Exit) 106/60    Heart Rate (Admit) 80 bpm    Heart Rate (Exercise) 120 bpm    Heart Rate (Exit) 95 bpm    Oxygen Saturation (Admit) 96 %    Oxygen Saturation (Exercise) 92 %    Oxygen Saturation (Exit) 93 %    Rating of  Perceived Exertion  (Exercise) 14    Perceived Dyspnea (Exercise) 0    Symptoms none    Duration Continue with 30 min of aerobic exercise without signs/symptoms of physical distress.    Intensity THRR unchanged      Progression   Progression Continue to progress workloads to maintain intensity without signs/symptoms of physical distress.    Average METs 3.97      Resistance Training   Training Prescription Yes    Weight 8 lb    Reps 10-15      Interval Training   Interval Training No      Treadmill   MPH 3.6    Grade 1.5    Minutes 15    METs 4.5      NuStep   Level 7    Minutes 15    METs 5.3      T5 Nustep   Level 6    Minutes 15    METs 3.6      Biostep-RELP   Level 5    Minutes 15    METs 3      Home Exercise Plan   Plans to continue exercise at Home (comment)   walking and stationary bike   Frequency Add 2 additional days to program exercise sessions.    Initial Home Exercises Provided 07/06/23      Oxygen   Maintain Oxygen Saturation 88% or higher             Nutrition:  Target Goals: Understanding of nutrition guidelines, daily intake of sodium 1500mg , cholesterol 200mg , calories 30% from fat and 7% or less from saturated fats, daily to have 5 or more servings of fruits and vegetables.  Education: All About Nutrition: -Group instruction provided by verbal, written material, interactive activities, discussions, models, and posters to present general guidelines for heart healthy nutrition including fat, fiber, MyPlate, the role of sodium in heart healthy nutrition, utilization of the nutrition label, and utilization of this knowledge for meal planning. Follow up email sent as well. Written material given at graduation. Flowsheet Row Cardiac Rehab from 06/13/2023 in [facility] Re[facility]  Education need identified 06/13/23       Biometrics:  Pre Biometrics - 06/13/23 1532       Pre Biometrics   Height 5\' 10"  (1.778 m)    Weight 179 lb 3.2 oz (81.3 kg)     Waist Circumference 35.5 inches    Hip Circumference 40.5 inches    Waist to Hip Ratio 0.88 %    BMI (Calculated) 25.71    Single Leg Stand 30 seconds              Nutrition Therapy Plan and Nutrition Goals:  Nutrition Therapy & Goals - 06/13/23 1541       Nutrition Therapy   Diet Cardiac, Low Na    Protein (specify units) 90    Fiber 30 grams    Whole Grain Foods 3 servings    Saturated Fats 15 max. grams    Fruits and Vegetables 5 servings/day    Sodium 2 grams      Personal Nutrition Goals   Nutrition Goal Pair a protein or healthy fat with a complex carb    Personal Goal #2 Continue to limit fried foods and fatty meats.    Personal Goal #3 Continue to watch sodium and saturated fat    Comments Patient drinking 48oz of water consistently. Has made a lot of changes to cut back on  sodium, processed foods and sugar. Educated on types of fats, sources, and how to read a label. Reviewed 24hr food recall, spoke on ways to meet nutritional needs when appetite is down. Encouraged more balanced plates with a protein or healthy fat paired with a carb. Provided handout with examples and built out several meals and snacks with foods he likes and will eat focusing on reducing saturated fat and pairing proteins and carbs together.      Intervention Plan   Intervention Prescribe, educate and counsel regarding individualized specific dietary modifications aiming towards targeted core components such as weight, hypertension, lipid management, diabetes, heart failure and other comorbidities.;Nutrition handout(s) given to patient.    Expected Outcomes Short Term Goal: Understand basic principles of dietary content, such as calories, fat, sodium, cholesterol and nutrients.;Short Term Goal: A plan has been developed with personal nutrition goals set during dietitian appointment.;Long Term Goal: Adherence to prescribed nutrition plan.             Nutrition Assessments:  MEDIFICTS Score  Key: >=70 Need to make dietary changes  40-70 Heart Healthy Diet <= 40 Therapeutic Level Cholesterol Diet  Flowsheet Row Cardiac Rehab from 06/15/2023 in [facility] Cardiac and Pulmonary Rehab  Picture Your Plate Total Score on Admission 63      Picture Your Plate Scores: <16 Unhealthy dietary pattern with much room for improvement. 41-50 Dietary pattern unlikely to meet recommendations for good health and room for improvement. 51-60 More healthful dietary pattern, with some room for improvement.  >60 Healthy dietary pattern, although there may be some specific behaviors that could be improved.    Nutrition Goals Re-Evaluation:  Nutrition Goals Re-Evaluation     Row Name 07/20/23 1732 08/22/23 1735 09/19/23 1717         Goals   Comment Reita Cliche states that he has been mindful of sodium intake. He does not add salt at the table and has started reading food labels. He reports drinking plenty of fluids. He does not drink sugary drinks but mostly flavored water with low sugar and sodium content. He was for the most part been avoiding fried food. Reita Cliche states that he continues to watch what he is eating and maintain a heart healthy diet. He does not eat fast food or fried foods anymore. He also has stopped adding salt to food. Reita Cliche is working on staying away from [employer] and eating more vegetables. He states that food is very expensive and is trying to find food for cheaper that is not bad for him.     Expected Outcome Short: continue to read food labels and watch sodium intake. Long: maintain heart healthy diet to control cardiac risk factors. Short: read food labels and continue to reduce sodium intake. continue to not eat fast food or fried food. Long: maintain heart health diet long term to help control cardiac risk factors. Short: eat more vegetables. Long: maintain intake of healthier foods independently.       Personal Goal #2 Re-Evaluation   Personal Goal #2 Continue to limit fried foods  and fatty meats. Continue to limit fried foods and fatty meats. --       Personal Goal #3 Re-Evaluation   Personal Goal #3 Continue to watch sodium and saturated fat Continue to watch sodium and saturated fat --              Nutrition Goals Discharge (Final Nutrition Goals Re-Evaluation):  Nutrition Goals Re-Evaluation - 09/19/23 1717       Goals  Comment Reita Cliche is working on staying away from [employer] and eating more vegetables. He states that food is very expensive and is trying to find food for cheaper that is not bad for him.    Expected Outcome Short: eat more vegetables. Long: maintain intake of healthier foods independently.             Psychosocial: Target Goals: Acknowledge presence or absence of significant depression and/or stress, maximize coping skills, provide positive support system. Participant is able to verbalize types and ability to use techniques and skills needed for reducing stress and depression.   Education: Stress, Anxiety, and Depression - Group verbal and visual presentation to define topics covered.  Reviews how body is impacted by stress, anxiety, and depression.  Also discusses healthy ways to reduce stress and to treat/manage anxiety and depression.  Written material given at graduation.   Education: Sleep Hygiene -Provides group verbal and written instruction about how sleep can affect your health.  Define sleep hygiene, discuss sleep cycles and impact of sleep habits. Review good sleep hygiene tips.    Initial Review & Psychosocial Screening:  Initial Psych Review & Screening - 06/08/23 1422       Initial Review   Current issues with Current Stress Concerns    Source of Stress Concerns Financial      Family Dynamics   Good Support System? Yes   family and girlfriend     Barriers   Psychosocial barriers to participate in program There are no identifiable barriers or psychosocial needs.;The patient should benefit from training in stress  management and relaxation.      Screening Interventions   Interventions Encouraged to exercise;Provide feedback about the scores to participant;To provide support and resources with identified psychosocial needs    Expected Outcomes Short Term goal: Utilizing psychosocial counselor, staff and physician to assist with identification of specific Stressors or current issues interfering with healing process. Setting desired goal for each stressor or current issue identified.;Long Term Goal: Stressors or current issues are controlled or eliminated.;Short Term goal: Identification and review with participant of any Quality of Life or Depression concerns found by scoring the questionnaire.;Long Term goal: The participant improves quality of Life and PHQ9 Scores as seen by post scores and/or verbalization of changes             Quality of Life Scores:   Quality of Life - 06/13/23 1533       Quality of Life   Select Quality of Life      Quality of Life Scores   Health/Function Pre 13.23 %    Socioeconomic Pre 16.19 %    Psych/Spiritual Pre 15.43 %    Family Pre 24.8 %    GLOBAL Pre 16 %            Scores of 19 and below usually indicate a poorer quality of life in these areas.  A difference of  2-3 points is a clinically meaningful difference.  A difference of 2-3 points in the total score of the Quality of Life Index has been associated with significant improvement in overall quality of life, self-image, physical symptoms, and general health in studies assessing change in quality of life.  PHQ-9: Review Flowsheet       06/13/2023 09/25/2015 05/12/2015  Depression screen PHQ 2/9  Decreased Interest 1 3 2   Down, Depressed, Hopeless 1 3 0  PHQ - 2 Score 2 6 2   Altered sleeping 1 3 1   Tired, decreased energy  1 3 3   Change in appetite 0 0 1  Feeling bad or failure about yourself  0 1 3  Trouble concentrating 0 0 0  Moving slowly or fidgety/restless 0 0 0  Suicidal thoughts 0 0 0   PHQ-9 Score 4 13 10   Difficult doing work/chores Somewhat difficult Somewhat difficult Not difficult at all   Interpretation of Total Score  Total Score Depression Severity:  1-4 = Minimal depression, 5-9 = Mild depression, 10-14 = Moderate depression, 15-19 = Moderately severe depression, 20-27 = Severe depression   Psychosocial Evaluation and Intervention:  Psychosocial Evaluation - 06/08/23 1437       Psychosocial Evaluation & Interventions   Interventions Encouraged to exercise with the program and follow exercise prescription;Stress management education;Relaxation education    Comments 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 He said this was a wake up call and has been making big lifestyle changes. He has his [employer], so he is on hold from working until he starts the program. He then will have to do a physical and undergo a stress test to be able to do his job fully. He states this has been a stressor being off of work since he supports himself. His family and girlfriend have been helpful in motivating him, but he is ready to get back to work and his normal routine. He has made a lot of diet changes and he has cut his smoking from 2 packs a day to about 5 cigarettes. He knows he needs to quit and is trying to. He has a history of anxiety and depression which he states is stable at this time. His biggest concern in getting back to work at full capacity and learning how to live a heart healthy lifestyle    Expected Outcomes Short: attend cardiac rehab for education and exercise. Long; Develop and maintain positive self care habits    Continue Psychosocial Services  Follow up required by staff             Psychosocial Re-Evaluation:  Psychosocial Re-Evaluation     Row Name 07/20/23 1747 08/11/23 1744 09/19/23 1720         Psychosocial Re-Evaluation   Current issues with Current Stress Concerns Current Anxiety/Panic;Current Depression;Current Sleep Concerns Current  Anxiety/Panic;Current Depression;Current Sleep Concerns     Comments Patient reports that he has no changes in his current stress, sleep, or mental health. He does have a support system with his girlfriend and sister who he can relay on for support. Reita Cliche reports that he has been dealing with increased anxiety/panic and depression related to his heart and newly discovered blockage. It has been affecting his sleep as he wakes up in a panic and is having fears of death due to this diagnosis. I provided Reita Cliche with our mental health resources in the area as he is seeking out treatment for these psychosocial issues. Reita Cliche states his anxiety is down alot and able to wrap his mind arounds having a heart event. He is ready to exercise at home and is looking for equipment to help his health and his mood.     Expected Outcomes Short: continue to attend cardiac rehab to gain mental health benefits from exericse. Long: maintain good mental health habits. Short: Continue to attend cardiac rehab to gain mental health benefits from exercise and find counseling regarding his depression and anxiety. Long: Continue treatment related to depression and anxiety. Short: Continue to attend [facility] regularly for regular exercise and social  engagement. Long: Continue to improve symptoms and manage a positive mental state.     Interventions Encouraged to attend Cardiac Rehabilitation for the exercise Encouraged to attend Cardiac Rehabilitation for the exercise;Therapist referral  provided mental health resources Encouraged to attend Cardiac Rehabilitation for the exercise     Continue Psychosocial Services  Follow up required by staff Follow up required by staff Follow up required by staff              Psychosocial Discharge (Final Psychosocial Re-Evaluation):  Psychosocial Re-Evaluation - 09/19/23 1720       Psychosocial Re-Evaluation   Current issues with Current Anxiety/Panic;Current Depression;Current Sleep Concerns     Comments Reita Cliche states his anxiety is down alot and able to wrap his mind arounds having a heart event. He is ready to exercise at home and is looking for equipment to help his health and his mood.    Expected Outcomes Short: Continue to attend [facility] regularly for regular exercise and social engagement. Long: Continue to improve symptoms and manage a positive mental state.    Interventions Encouraged to attend Cardiac Rehabilitation for the exercise    Continue Psychosocial Services  Follow up required by staff             Vocational Rehabilitation: Provide vocational rehab assistance to qualifying candidates.   Vocational Rehab Evaluation & Intervention:  Vocational Rehab - 06/13/23 1534       Initial Vocational Rehab Evaluation & Intervention   Assessment shows need for Vocational Rehabilitation No             Education: Education Goals: Education classes will be provided on a variety of topics geared toward better understanding of heart health and risk factor modification. Participant will state understanding/return demonstration of topics presented as noted by education test scores.  Learning Barriers/Preferences:  Learning Barriers/Preferences - 06/08/23 1421       Learning Barriers/Preferences   Learning Barriers None    Learning Preferences None             General Cardiac Education Topics:  AED/CPR: - Group verbal and written instruction with the use of models to demonstrate the basic use of the AED with the basic ABC's of resuscitation.   Anatomy and Cardiac Procedures: - Group verbal and visual presentation and models provide information about basic cardiac anatomy and function. Reviews the testing methods done to diagnose heart disease and the outcomes of the test results. Describes the treatment choices: Medical Management, Angioplasty, or Coronary Bypass Surgery for treating various heart conditions including Myocardial Infarction, Angina, Valve  Disease, and Cardiac Arrhythmias.  Written material given at graduation. Flowsheet Row Cardiac Rehab from 06/13/2023 in [facility] Cardiac and Pulmonary Rehab  Education need identified 06/13/23       Medication Safety: - Group verbal and visual instruction to review commonly prescribed medications for heart and lung disease. Reviews the medication, class of the drug, and side effects. Includes the steps to properly store meds and maintain the prescription regimen.  Written material given at graduation.   Intimacy: - Group verbal instruction through game format to discuss how heart and lung disease can affect sexual intimacy. Written material given at graduation..   Know Your Numbers and Heart Failure: - Group verbal and visual instruction to discuss disease risk factors for cardiac and pulmonary disease and treatment options.  Reviews associated critical values for Overweight/Obesity, Hypertension, Cholesterol, and Diabetes.  Discusses basics of heart failure: signs/symptoms and treatments.  Introduces Heart Failure Zone chart  for action plan for heart failure.  Written material given at graduation. Flowsheet Row Cardiac Rehab from 06/13/2023 in [facility] Cardiac and Pulmonary Rehab  Education need identified 06/13/23       Infection Prevention: - Provides verbal and written material to individual with discussion of infection control including proper hand washing and proper equipment cleaning during exercise session. Flowsheet Row Cardiac Rehab from 06/13/2023 in [facility] Cardiac and Pulmonary Rehab  Date 06/13/23  Educator [facility]  Instruction Review Code 1- Verbalizes Understanding       Falls Prevention: - Provides verbal and written material to individual with discussion of falls prevention and safety. Flowsheet Row Cardiac Rehab from 06/13/2023 in [facility] Cardiac and Pulmonary Rehab  Date 06/13/23  Educator [facility]  Instruction Review Code 1- Verbalizes Understanding       Other: -Provides group  and verbal instruction on various topics (see comments)   Knowledge Questionnaire Score:  Knowledge Questionnaire Score - 06/13/23 1534       Knowledge Questionnaire Score   Pre Score 22/26             Core Components/Risk Factors/Patient Goals at Admission:  Personal Goals and Risk Factors at Admission - 06/13/23 1526       Core Components/Risk Factors/Patient Goals on Admission    Weight Management Yes    Intervention Weight Management: Develop a combined nutrition and exercise program designed to reach desired caloric intake, while maintaining appropriate intake of nutrient and fiber, sodium and fats, and appropriate energy expenditure required for the weight goal.;Weight Management: Provide education and appropriate resources to help participant work on and attain dietary goals.;Weight Management/Obesity: Establish reasonable short term and long term weight goals.    Admit Weight 179 lb 3.2 oz (81.3 kg)    Goal Weight: Short Term 179 lb (81.2 kg)    Goal Weight: Long Term 179 lb (81.2 kg)    Expected Outcomes Short Term: Continue to assess and modify interventions until short term weight is achieved;Long Term: Adherence to nutrition and physical activity/exercise program aimed toward attainment of established weight goal;Weight Maintenance: Understanding of the daily nutrition guidelines, which includes 25-35% calories from fat, 7% or less cal from saturated fats, less than 200mg  cholesterol, less than 1.5gm of sodium, & 5 or more servings of fruits and vegetables daily;Understanding recommendations for meals to include 15-35% energy as protein, 25-35% energy from fat, 35-60% energy from carbohydrates, less than 200mg  of dietary cholesterol, 20-35 gm of total fiber daily;Understanding of distribution of calorie intake throughout the day with the consumption of 4-5 meals/snacks    Tobacco Cessation Yes    Number of packs per day 1/2    Intervention Assist the participant in steps to  quit. Provide individualized education and counseling about committing to Tobacco Cessation, relapse prevention, and pharmacological support that can be provided by physician.;Education officer, environmental, assist with locating and accessing local/national Quit Smoking programs, and support quit date choice.    Expected Outcomes Short Term: Will demonstrate readiness to quit, by selecting a quit date.;Short Term: Will quit all tobacco product use, adhering to prevention of relapse plan.;Long Term: Complete abstinence from all tobacco products for at least 12 months from quit date.    Intervention Provide education and support for participant on nutrition & aerobic/resistive exercise along with prescribed medications to achieve LDL 70mg , HDL >40mg .    Expected Outcomes Short Term: Participant states understanding of desired cholesterol values and is compliant with medications prescribed. Participant is following exercise prescription and nutrition guidelines.;Long  Term: Cholesterol controlled with medications as prescribed, with individualized exercise RX and with personalized nutrition plan. Value goals: LDL < 70mg , HDL > 40 mg.             Education:Diabetes - Individual verbal and written instruction to review signs/symptoms of diabetes, desired ranges of glucose level fasting, after meals and with exercise. Acknowledge that pre and post exercise glucose checks will be done for 3 sessions at entry of program.   Core Components/Risk Factors/Patient Goals Review:   Goals and Risk Factor Review     Row Name 06/13/23 1526 07/20/23 1743 08/22/23 1738 09/19/23 1712       Core Components/Risk Factors/Patient Goals Review   Personal Goals Review Tobacco Cessation Tobacco Cessation Tobacco Cessation;Lipids Tobacco Cessation    Review Reita Cliche is a current tobacco user. Intervention for tobacco cessation was provided at the initial medical review. He was asked about readiness to quit and reported he has  cut back to a half pack a day and is considering the steps he needs to take to quit smoking . Patient was advised and educated about tobacco cessation using combination therapy, tobacco cessation classes, quit line, and quit smoking apps. Patient demonstrated understanding of this material. Staff will continue to provide encouragement and follow up with the patient throughout the program. Reita Cliche reports that he is still smoking about 1/2 pack a day. He has in the past used the patch and been able to cut back more. He is looking possibly using the patch again to aid in trying to cut pack. Other smoking cessation options were discussed with patient. He is also planning to buy fake cigarettes to help with the hand to mouth habit of smoking. Reita Cliche reports that he continues to take all cholesterol meds as prescribed. He follows up with his doctor regularly for required lab work. Reita Cliche has cut back to 8 cigarettes a day. He started by stopping smoking indoors, then his vehicle, and now he feels ready to quit. He did get the patch and plans to start using it tomorrow quit all together. Reita Cliche has cut back on his smoking. Today he has got down to 7 cigarettes in the last 24 hours. He is trying some candies and patches this week. He is working on quitting all together and wants to as soon as he can.    Expected Outcomes -- Short: start using the patch and fake cigarettes to help cut back on smoking. Long: become tobacco free. Short: start  using the patch and follow though with quit date, which is tomorrow 08/23/2023. Long: continue to control cholesterol and become tobacco free long term. Short: reduce smoking even further. Long: quit tobacco all together.             Core Components/Risk Factors/Patient Goals at Discharge (Final Review):   Goals and Risk Factor Review - 09/19/23 1712       Core Components/Risk Factors/Patient Goals Review   Personal Goals Review Tobacco Cessation    Review Reita Cliche has cut back on  his smoking. Today he has got down to 7 cigarettes in the last 24 hours. He is trying some candies and patches this week. He is working on quitting all together and wants to as soon as he can.    Expected Outcomes Short: reduce smoking even further. Long: quit tobacco all together.             ITP Comments:  ITP Comments     Row Name 06/08/23 1429 06/13/23 1523  06/15/23 1723 06/29/23 1146 07/27/23 1006   ITP Comments Initial phone call completed. Diagnosis can be found in [facility] 10/12. EP Orientation scheduled for Monday 11/4 1:30.   Cannen is a current tobacco user. Intervention for tobacco cessation was provided at the initial medical review. He was asked about readiness to quit and reported that he is ready to start trying. He has reduced his smoking from 2 packs to 5 cigs a day . Patient was advised and educated about tobacco cessation using combination therapy, tobacco cessation classes, quit line, and quit smoking apps. Patient demonstrated understanding of this material. Staff will continue to provide encouragement and follow up with the patient throughout the program. Completed and gym orientation. Initial ITP created and sent for review to Dr. Bethann Punches, Medical Director. Reita Cliche is a current tobacco user. Intervention for tobacco cessation was provided at the initial medical review. He was asked about readiness to quit and reported he has cut back to a half pack a day and is considering the steps he needs to take to quit smoking . Patient was advised and educated about tobacco cessation using combination therapy, tobacco cessation classes, quit line, and quit smoking apps. Patient demonstrated understanding of this material. Staff will continue to provide encouragement and follow up with the patient throughout the program. First full day of exercise!  Patient was oriented to gym and equipment including functions, settings, policies, and procedures.  Patient's individual exercise prescription  and treatment plan were reviewed.  All starting workloads were established based on the results of the 6 minute walk test done at initial orientation visit.  The plan for exercise progression was also introduced and progression will be customized based on patient's performance and goals. 30 Day review completed. Medical Director ITP review done, changes made as directed, and signed approval by Medical Director.    new to program 30 Day review completed. Medical Director ITP review done, changes made as directed, and signed approval by Medical Director.    Row Name 08/24/23 1313 09/21/23 1129         ITP Comments 30 Day review completed. Medical Director ITP review done, changes made as directed, and signed approval by Medical Director. 30 Day review completed. Medical Director ITP review done, changes made as directed, and signed approval by Medical Director.               Comments:

## 2023-09-22 ENCOUNTER — Encounter: Payer: BC Managed Care – PPO | Admitting: *Deleted

## 2023-09-22 DIAGNOSIS — I213 ST elevation (STEMI) myocardial infarction of unspecified site: Secondary | ICD-10-CM

## 2023-09-22 DIAGNOSIS — Z955 Presence of coronary angioplasty implant and graft: Secondary | ICD-10-CM

## 2023-09-22 NOTE — Progress Notes (Signed)
Daily Session Note  Patient Details  Name: Joshua Leblanc. MRN: [id number] Date of Birth: [birth year] Referring Provider:   Flowsheet Row Cardiac Rehab from 06/13/2023 in [facility] Cardiac and Pulmonary Rehab  Referring Provider Rita Ohara       Encounter Date: 09/22/2023  Check In:  Session Check In - 09/22/23 1711       Check-In   Supervising physician immediately available to respond to emergencies See telemetry face sheet for immediately available ER MD    Location [facility]-Cardiac & Pulmonary Rehab    Staff Present Susann Givens RN,BSN;Joseph [facility] BS, Exercise Physiologist    Virtual Visit No    Medication changes reported     No    Fall or balance concerns reported    No    Tobacco Cessation Use Increase    Current number of cigarettes/nicotine per day     11    Warm-up and Cool-down Performed on first and last piece of equipment    Resistance Training Performed Yes    VAD Patient? No    PAD/SET Patient? No      Pain Assessment   Currently in Pain? No/denies                Social History   Tobacco Use  Smoking Status Every Day   Current packs/day: 1.00   Average packs/day: 1 pack/day for 15.0 years (15.0 ttl pk-yrs)   Types: Cigarettes  Smokeless Tobacco Never    Goals Met:  Independence with exercise equipment Exercise tolerated well No report of concerns or symptoms today  Goals Unmet:  Not Applicable  Comments: Pt able to follow exercise prescription today without complaint.  Will continue to monitor for progression.    Dr. Bethann Punches is Medical Director for [facility] Cardiac Rehabilitation.  Dr. Vida Rigger is Medical Director for [facility] Pulmonary Rehabilitation.

## 2023-10-12 ENCOUNTER — Encounter: Payer: Self-pay | Attending: Cardiology | Admitting: *Deleted

## 2023-10-12 DIAGNOSIS — Z955 Presence of coronary angioplasty implant and graft: Secondary | ICD-10-CM | POA: Insufficient documentation

## 2023-10-12 DIAGNOSIS — I213 ST elevation (STEMI) myocardial infarction of unspecified site: Secondary | ICD-10-CM | POA: Diagnosis present

## 2023-10-12 NOTE — Progress Notes (Signed)
 Daily Session Note  Patient Details  Name: Joshua Leblanc. MRN: [id number] Date of Birth: 18-Jul-1976 Referring Provider:   Flowsheet Row Cardiac Rehab from 06/13/2023 in [facility] Cardiac and Pulmonary Rehab  Referring Provider Rita Ohara       Encounter Date: 10/12/2023  Check In:  Session Check In - 10/12/23 1706       Check-In   Supervising physician immediately available to respond to emergencies See telemetry face sheet for immediately available ER MD    Location [facility]-Cardiac & Pulmonary Rehab    Staff Present Susann Givens RN,BSN;Joseph [facility] BS, ACSM CEP, Exercise Physiologist    Virtual Visit No    Medication changes reported     No    Fall or balance concerns reported    No    Tobacco Cessation No Change    Current number of cigarettes/nicotine per day     11    Warm-up and Cool-down Performed on first and last piece of equipment    Resistance Training Performed Yes    VAD Patient? No    PAD/SET Patient? No      Pain Assessment   Currently in Pain? No/denies                Social History   Tobacco Use  Smoking Status Every Day   Current packs/day: 1.00   Average packs/day: 1 pack/day for 15.0 years (15.0 ttl pk-yrs)   Types: Cigarettes  Smokeless Tobacco Never    Goals Met:  Independence with exercise equipment Exercise tolerated well Personal goals reviewed No report of concerns or symptoms today Strength training completed today  Goals Unmet:  Not Applicable  Comments: Pt able to follow exercise prescription today without complaint.  Will continue to monitor for progression.     Dr. Bethann Punches is Medical Director for [facility] Cardiac Rehabilitation.  Dr. Vida Rigger is Medical Director for [facility] Pulmonary Rehabilitation.

## 2023-10-13 ENCOUNTER — Encounter: Payer: Self-pay | Admitting: *Deleted

## 2023-10-13 DIAGNOSIS — I213 ST elevation (STEMI) myocardial infarction of unspecified site: Secondary | ICD-10-CM

## 2023-10-13 NOTE — Progress Notes (Signed)
 Daily Session Note  Patient Details  Name: Joshua Leblanc. MRN: [id number] Date of Birth: [birth year] Referring Provider:   Flowsheet Row Cardiac Rehab from 06/13/2023 in [facility] Cardiac and Pulmonary Rehab  Referring Provider Rita Ohara       Encounter Date: 10/13/2023  Check In:  Session Check In - 10/13/23 1717       Check-In   Supervising physician immediately available to respond to emergencies See telemetry face sheet for immediately available ER MD    Location [facility]-Cardiac & Pulmonary Rehab    Staff Present Susann Givens RN,BSN;Katara Griner PhD, RN,CNS,CEN;Joseph Hollace Kinnier    Virtual Visit No    Medication changes reported     No    Fall or balance concerns reported    No    Tobacco Cessation No Change    Warm-up and Cool-down Performed on first and last piece of equipment    Resistance Training Performed Yes    VAD Patient? No    PAD/SET Patient? No      Pain Assessment   Currently in Pain? No/denies                Social History   Tobacco Use  Smoking Status Every Day   Current packs/day: 1.00   Average packs/day: 1 pack/day for 15.0 years (15.0 ttl pk-yrs)   Types: Cigarettes  Smokeless Tobacco Never    Goals Met:  Independence with exercise equipment Exercise tolerated well No report of concerns or symptoms today Strength training completed today  Goals Unmet:  Not Applicable  Comments: Pt able to follow exercise prescription today without complaint.  Will continue to monitor for progression.    Dr. Bethann Punches is Medical Director for [facility] Cardiac Rehabilitation.  Dr. Vida Rigger is Medical Director for [facility] Pulmonary Rehabilitation.

## 2023-10-17 ENCOUNTER — Encounter: Payer: Self-pay | Admitting: *Deleted

## 2023-10-17 DIAGNOSIS — I213 ST elevation (STEMI) myocardial infarction of unspecified site: Secondary | ICD-10-CM | POA: Diagnosis not present

## 2023-10-17 DIAGNOSIS — Z955 Presence of coronary angioplasty implant and graft: Secondary | ICD-10-CM

## 2023-10-17 NOTE — Progress Notes (Signed)
 Daily Session Note  Patient Details  Name: Joshua Leblanc. MRN: [id number] Date of Birth: [birth year] Referring Provider:   Flowsheet Row Cardiac Rehab from 06/13/2023 in [facility] Cardiac and Pulmonary Rehab  Referring Provider Rita Ohara       Encounter Date: 10/17/2023  Check In:  Session Check In - 10/17/23 1711       Check-In   Supervising physician immediately available to respond to emergencies See telemetry face sheet for immediately available ER MD    Location [facility]-Cardiac & Pulmonary Rehab    Staff Present Susann Givens RN,BSN;Joseph [facility] [place] BS, ACSM CEP, Exercise Physiologist    Virtual Visit No    Medication changes reported     No    Fall or balance concerns reported    No    Tobacco Cessation Use Increase    Current number of cigarettes/nicotine per day     14    Warm-up and Cool-down Performed on first and last piece of equipment    Resistance Training Performed Yes    VAD Patient? No    PAD/SET Patient? No      Pain Assessment   Currently in Pain? No/denies                Social History   Tobacco Use  Smoking Status Every Day   Current packs/day: 1.00   Average packs/day: 1 pack/day for 15.0 years (15.0 ttl pk-yrs)   Types: Cigarettes  Smokeless Tobacco Never    Goals Met:  Independence with exercise equipment Exercise tolerated well No report of concerns or symptoms today Strength training completed today  Goals Unmet:  Not Applicable  Comments: Pt able to follow exercise prescription today without complaint.  Will continue to monitor for progression.    Dr. Bethann Punches is Medical Director for [facility] Cardiac Rehabilitation.  Dr. Vida Rigger is Medical Director for [facility] Pulmonary Rehabilitation.

## 2023-10-19 ENCOUNTER — Encounter: Payer: Self-pay | Admitting: *Deleted

## 2023-10-19 VITALS — Ht 70.0 in | Wt 174.6 lb

## 2023-10-19 DIAGNOSIS — I213 ST elevation (STEMI) myocardial infarction of unspecified site: Secondary | ICD-10-CM

## 2023-10-19 DIAGNOSIS — Z955 Presence of coronary angioplasty implant and graft: Secondary | ICD-10-CM

## 2023-10-19 NOTE — Progress Notes (Signed)
 Cardiac Individual Treatment Plan  Patient Details  Name: Joshua Leblanc. MRN: [id number] Date of Birth: 02-26-1976 Referring Provider:   Flowsheet Row Cardiac Rehab from 06/13/2023 in [facility] Cardiac and Pulmonary Rehab  Referring Provider Rita Ohara       Initial Encounter Date:  Flowsheet Row Cardiac Rehab from 06/13/2023 in [facility] Cardiac and Pulmonary Rehab  Date 06/13/23       Visit Diagnosis: ST elevation myocardial infarction (STEMI), unspecified artery [facility])  Status post coronary artery stent placement  Patient's Home Medications on Admission:  Current Outpatient Medications:    aspirin EC (ASPIRIN 81) 81 MG tablet, Take 1 tablet (81 mg total) by mouth daily., Disp: 60 tablet, Rfl: 0   aspirin EC (ASPIRIN 81) 81 MG tablet, Take 1 tablet (81 mg total) by mouth daily., Disp: 60 tablet, Rfl: 0   aspirin EC 81 MG tablet, Take 1 tablet (81 mg total) by mouth daily. Swallow whole., Disp: 60 tablet, Rfl: 0   atorvastatin (LIPITOR) 80 MG tablet, Take 1 tablet (80 mg total) by mouth daily., Disp: 30 tablet, Rfl: 5   atorvastatin (LIPITOR) 80 MG tablet, Take 1 tablet (80 mg total) by mouth once daily, Disp: 30 tablet, Rfl: 5   atorvastatin (LIPITOR) 80 MG tablet, Take 1 tablet (80 mg total) by mouth daily., Disp: 30 tablet, Rfl: 5   atorvastatin (LIPITOR) 80 MG tablet, Take 1 tablet (80 mg total) by mouth daily., Disp: 30 tablet, Rfl: 5   atorvastatin (LIPITOR) 80 MG tablet, Take 1 tablet (80 mg total) by mouth daily., Disp: 30 tablet, Rfl: 5   cephALEXin (KEFLEX) 500 MG capsule, Take by mouth., Disp: , Rfl:    diazepam (VALIUM) 5 MG tablet, Take 0.5 to 1 tab every 8 hours as needed for anxiety., Disp: , Rfl:    FLUoxetine (PROZAC) 20 MG capsule, Take by mouth., Disp: , Rfl:    lisinopril (ZESTRIL) 2.5 MG tablet, Take 1 tablet (2.5 mg total) by mouth daily., Disp: 30 tablet, Rfl: 5   lisinopril (ZESTRIL) 2.5 MG tablet, Take 1 tablet (2.5 mg total) by mouth once daily,  Disp: 30 tablet, Rfl: 5   lisinopril (ZESTRIL) 2.5 MG tablet, Take 1 tablet (2.5 mg total) by mouth daily., Disp: 30 tablet, Rfl: 5   lisinopril (ZESTRIL) 2.5 MG tablet, Take 1 tablet (2.5 mg total) by mouth daily., Disp: 30 tablet, Rfl: 5   metoprolol succinate (TOPROL-XL) 25 MG 24 hr tablet, Take 1 tablet (25 mg total) by mouth once daily., Disp: 30 tablet, Rfl: 5   metoprolol succinate (TOPROL-XL) 25 MG 24 hr tablet, Take 1 tablet (25 mg total) by mouth once daily, Disp: 30 tablet, Rfl: 5   metoprolol succinate (TOPROL-XL) 25 MG 24 hr tablet, Take 1 tablet (25 mg total) by mouth daily., Disp: 30 tablet, Rfl: 5   metoprolol succinate (TOPROL-XL) 25 MG 24 hr tablet, Take 1 tablet (25 mg total) by mouth daily., Disp: 30 tablet, Rfl: 5   metoprolol succinate (TOPROL-XL) 25 MG 24 hr tablet, Take 1 tablet (25 mg total) by mouth once daily, Disp: 30 tablet, Rfl: 5   nicotine (NICODERM CQ - DOSED IN MG/24 HOURS) 21 mg/24hr patch, Place 1 patch (21 mg total) onto the skin daily., Disp: , Rfl:    prasugrel (EFFIENT) 10 MG TABS tablet, Take 1 tablet (10 mg total) by mouth once daily., Disp: 30 tablet, Rfl: 5   prasugrel (EFFIENT) 10 MG TABS tablet, Take 1 tablet (10 mg total) by mouth  once daily, Disp: 30 tablet, Rfl: 5   prasugrel (EFFIENT) 10 MG TABS tablet, Take 1 tablet (10 mg total) by mouth daily., Disp: 30 tablet, Rfl: 5   prasugrel (EFFIENT) 10 MG TABS tablet, Take 1 tablet (10 mg total) by mouth daily., Disp: 30 tablet, Rfl: 5   prasugrel (EFFIENT) 10 MG TABS tablet, Take 1 tablet (10 mg total) by mouth once daily, Disp: 30 tablet, Rfl: 5   spironolactone (ALDACTONE) 25 MG tablet, Take 0.5 tablets (12.5 mg total) by mouth daily., Disp: 30 tablet, Rfl: 0  Past Medical History: Past Medical History:  Diagnosis Date   Anxiety    Depression    Headache    Neck pain     Tobacco Use: Social History   Tobacco Use  Smoking Status Every Day   Current packs/day: 1.00   Average packs/day: 1  pack/day for 15.0 years (15.0 ttl pk-yrs)   Types: Cigarettes  Smokeless Tobacco Never    Labs: Review Flowsheet       Latest Ref Rng & Units 05/21/2023  Labs for ITP Cardiac and Pulmonary Rehab  Cholestrol 0 - 200 mg/dL 409   LDL (calc) 0 - 99 mg/dL 94   HDL-C >81 mg/dL 32   Trlycerides <191 mg/dL 478   Hemoglobin G9F 4.8 - 5.6 % 5.4      Exercise Target Goals: Exercise Program Goal: Individual exercise prescription set using results from initial 6 min walk test and THRR while considering  patient's activity barriers and safety.   Exercise Prescription Goal: Initial exercise prescription builds to 30-45 minutes a day of aerobic activity, 2-3 days per week.  Home exercise guidelines will be given to patient during program as part of exercise prescription that the participant will acknowledge.   Education: Aerobic Exercise: - Group verbal and visual presentation on the components of exercise prescription. Introduces F.I.T.T principle from ACSM for exercise prescriptions.  Reviews F.I.T.T. principles of aerobic exercise including progression. Written material given at graduation.   Education: Resistance Exercise: - Group verbal and visual presentation on the components of exercise prescription. Introduces F.I.T.T principle from ACSM for exercise prescriptions  Reviews F.I.T.T. principles of resistance exercise including progression. Written material given at graduation.    Education: Exercise & Equipment Safety: - Individual verbal instruction and demonstration of equipment use and safety with use of the equipment. Flowsheet Row Cardiac Rehab from 06/13/2023 in [facility] Cardiac and Pulmonary Rehab  Date 06/13/23  Educator [facility]  Instruction Review Code 1- Verbalizes Understanding       Education: Exercise Physiology & General Exercise Guidelines: - Group verbal and written instruction with models to review the exercise physiology of the cardiovascular system and associated  critical values. Provides general exercise guidelines with specific guidelines to those with heart or lung disease.    Education: Flexibility, Balance, Mind/Body Relaxation: - Group verbal and visual presentation with interactive activity on the components of exercise prescription. Introduces F.I.T.T principle from ACSM for exercise prescriptions. Reviews F.I.T.T. principles of flexibility and balance exercise training including progression. Also discusses the mind body connection.  Reviews various relaxation techniques to help reduce and manage stress (i.e. Deep breathing, progressive muscle relaxation, and visualization). Balance handout provided to take home. Written material given at graduation.   Activity Barriers & Risk Stratification:  Activity Barriers & Cardiac Risk Stratification - 06/13/23 1535       Activity Barriers & Cardiac Risk Stratification   Activity Barriers None    Cardiac Risk Stratification Moderate  6 Minute Walk:  6 Minute Walk     Row Name 06/13/23 1527         6 Minute Walk   Phase Initial     Distance 1280 feet     Walk Time 6 minutes     # of Rest Breaks 0     MPH 2.42     METS 4.3     RPE 9     Perceived Dyspnea  0     VO2 Peak 15.1     Symptoms No     Resting HR 70 bpm     Resting BP 112/62     Resting Oxygen Saturation  99 %     Exercise Oxygen Saturation  during 6 min walk 98 %     Max Ex. HR 88 bpm     Max Ex. BP 146/70     2 Minute Post BP 120/70              Oxygen Initial Assessment:   Oxygen Re-Evaluation:   Oxygen Discharge (Final Oxygen Re-Evaluation):   Initial Exercise Prescription:  Initial Exercise Prescription - 06/13/23 1500       Date of Initial Exercise RX and Referring Provider   Date 06/13/23    Referring Provider Rita Ohara      Oxygen   Maintain Oxygen Saturation 88% or higher      Treadmill   MPH 2.7    Grade 1    Minutes 15    METs 4.3      NuStep   Level 3     SPM 80    Minutes 15    METs 4.3      Elliptical   Level 1    Speed 3    Minutes 15    METs 4.3      REL-XR   Level 3    Speed 50    Minutes 15    METs 4.3      T5 Nustep   Level 3    SPM 80    Minutes 15    METs 4.3      Prescription Details   Frequency (times per week) 3    Duration Progress to 30 minutes of continuous aerobic without signs/symptoms of physical distress      Intensity   THRR 40-80% of Max Heartrate 111-153    Ratings of Perceived Exertion 11-13    Perceived Dyspnea 0-4      Progression   Progression Continue to progress workloads to maintain intensity without signs/symptoms of physical distress.      Resistance Training   Training Prescription Yes    Weight 4    Reps 10-15             Perform Capillary Blood Glucose checks as needed.  Exercise Prescription Changes:   Exercise Prescription Changes     Row Name 06/13/23 1500 06/30/23 1400 07/06/23 1700 07/12/23 1400 07/27/23 1400     Response to Exercise   Blood Pressure (Admit) 112/62 120/60 -- 108/72 120/70   Blood Pressure (Exercise) 146/70 150/80 -- 146/70 142/70   Blood Pressure (Exit) 120/70 110/70 -- 104/70 104/64   Heart Rate (Admit) 70 bpm 69 bpm -- 80 bpm 86 bpm   Heart Rate (Exercise) 88 bpm 122 bpm -- 111 bpm 127 bpm   Heart Rate (Exit) 71 bpm 87 bpm -- 89 bpm 91 bpm   Oxygen Saturation (Admit) 99 % -- -- -- --  Oxygen Saturation (Exercise) 98 % -- -- -- --   Oxygen Saturation (Exit) 98 % -- -- -- --   Rating of Perceived Exertion (Exercise) 9 13 -- 13 13   Perceived Dyspnea (Exercise) 0 -- -- 0 0   Symptoms none none -- none none   Comments 6 MWT results First full week of exercise -- -- --   Duration -- Continue with 30 min of aerobic exercise without signs/symptoms of physical distress. -- Progress to 30 minutes of  aerobic without signs/symptoms of physical distress Progress to 30 minutes of  aerobic without signs/symptoms of physical distress   Intensity -- THRR  unchanged THRR unchanged THRR unchanged THRR unchanged     Progression   Progression -- Continue to progress workloads to maintain intensity without signs/symptoms of physical distress. Continue to progress workloads to maintain intensity without signs/symptoms of physical distress. Continue to progress workloads to maintain intensity without signs/symptoms of physical distress. Continue to progress workloads to maintain intensity without signs/symptoms of physical distress.   Average METs -- 3.84 3.84 3.75 3.93     Resistance Training   Training Prescription -- Yes Yes Yes Yes   Weight -- 4 lb 4 lb 8 lb 8 lb   Reps -- 10-15 10-15 10-15 10-15     Interval Training   Interval Training -- No No No No     Treadmill   MPH -- 2.7 2.7 3 4    Grade -- 1 1 0.5 0.5   Minutes -- 15 15 15 15    METs -- 3.44 3.44 3.5 4.34     NuStep   Level -- 5 5 -- 5   Minutes -- 15 15 -- 15   METs -- 4.9 4.9 -- 4.6     REL-XR   Level -- 5 5 7 5    Minutes -- 15 15 15 15    METs -- 4.8 4.8 -- --     T5 Nustep   Level -- -- -- -- 5   Minutes -- -- -- -- 15   METs -- -- -- -- 3.9     Biostep-RELP   Level -- -- -- 4 --   Minutes -- -- -- 15 --   METs -- -- -- 4 --     Home Exercise Plan   Plans to continue exercise at -- -- Home (comment)  walking and stationary bike Home (comment)  walking and stationary bike Home (comment)  walking and stationary bike   Frequency -- -- Add 2 additional days to program exercise sessions. Add 2 additional days to program exercise sessions. Add 2 additional days to program exercise sessions.   Initial Home Exercises Provided -- -- 07/06/23 07/06/23 07/06/23     Oxygen   Maintain Oxygen Saturation -- 88% or higher 88% or higher 88% or higher 88% or higher    Row Name 08/11/23 1200 08/25/23 0700 09/07/23 1100 09/20/23 1500 10/05/23 0800     Response to Exercise   Blood Pressure (Admit) 122/76 120/70 112/62 104/60 118/66   Blood Pressure (Exit) 106/60 100/60 114/60  106/60 98/58   Heart Rate (Admit) 69 bpm 73 bpm 93 bpm 80 bpm 83 bpm   Heart Rate (Exercise) 121 bpm 93 bpm 106 bpm 120 bpm 103 bpm   Heart Rate (Exit) 91 bpm 86 bpm 90 bpm 95 bpm 80 bpm   Oxygen Saturation (Admit) 98 % 98 % 97 % 96 % 96 %   Oxygen Saturation (Exercise) 93 % 96 %  92 % 92 % 95 %   Oxygen Saturation (Exit) 93 % 96 % 96 % 93 % 97 %   Rating of Perceived Exertion (Exercise) 13 15 13 14 13    Perceived Dyspnea (Exercise) 0 -- 0 0 0   Symptoms none leg pain none none none   Duration Progress to 30 minutes of  aerobic without signs/symptoms of physical distress Continue with 30 min of aerobic exercise without signs/symptoms of physical distress. Continue with 30 min of aerobic exercise without signs/symptoms of physical distress. Continue with 30 min of aerobic exercise without signs/symptoms of physical distress. Continue with 30 min of aerobic exercise without signs/symptoms of physical distress.   Intensity THRR unchanged THRR unchanged THRR unchanged THRR unchanged THRR unchanged     Progression   Progression Continue to progress workloads to maintain intensity without signs/symptoms of physical distress. Continue to progress workloads to maintain intensity without signs/symptoms of physical distress. Continue to progress workloads to maintain intensity without signs/symptoms of physical distress. Continue to progress workloads to maintain intensity without signs/symptoms of physical distress. Continue to progress workloads to maintain intensity without signs/symptoms of physical distress.   Average METs 4.28 3.25 3.82 3.97 4.65     Resistance Training   Training Prescription Yes Yes Yes Yes Yes   Weight 8 lb 8 lb 8 lb 8 lb 8 lb   Reps 10-15 10-15 10-15 10-15 10-15     Interval Training   Interval Training No No No No No     Treadmill   MPH 3 3 3.6 3.6 3.3   Grade 0.5 0.5 1.5 1.5 0.5   Minutes 15 15 15 15 15    METs 3.5 3.5 4.5 4.5 3.75     NuStep   Level 7 -- 5 7 7     Minutes 15 -- 15 15 15    METs 6.3 -- 3.7 5.3 --     REL-XR   Level -- -- -- -- 8   Minutes -- -- -- -- 15   METs -- -- -- -- 6.7     T5 Nustep   Level 7  T6 6 6 6  --   Minutes 15 15 15 15  --   METs 4 3 3.6 3.6 --     Biostep-RELP   Level -- -- -- 5 --   Minutes -- -- -- 15 --   METs -- -- -- 3 --     Home Exercise Plan   Plans to continue exercise at Home (comment)  walking and stationary bike Home (comment)  walking and stationary bike Home (comment)  walking and stationary bike Home (comment)  walking and stationary bike Home (comment)  walking and stationary bike   Frequency Add 2 additional days to program exercise sessions. Add 2 additional days to program exercise sessions. Add 2 additional days to program exercise sessions. Add 2 additional days to program exercise sessions. Add 2 additional days to program exercise sessions.   Initial Home Exercises Provided 07/06/23 07/06/23 07/06/23 07/06/23 07/06/23     Oxygen   Maintain Oxygen Saturation 88% or higher 88% or higher 88% or higher 88% or higher 88% or higher            Exercise Comments:   Exercise Comments     Row Name 06/15/23 1723           Exercise Comments First full day of exercise!  Patient was oriented to gym and equipment including functions, settings, policies, and procedures.  Patient's individual  exercise prescription and treatment plan were reviewed.  All starting workloads were established based on the results of the 6 minute walk test done at initial orientation visit.  The plan for exercise progression was also introduced and progression will be customized based on patient's performance and goals.                Exercise Goals and Review:   Exercise Goals     Row Name 06/13/23 1531             Exercise Goals   Increase Physical Activity Yes       Intervention Provide advice, education, support and counseling about physical activity/exercise needs.;Develop an individualized  exercise prescription for aerobic and resistive training based on initial evaluation findings, risk stratification, comorbidities and participant's personal goals.       Expected Outcomes Short Term: Attend rehab on a regular basis to increase amount of physical activity.;Long Term: Add in home exercise to make exercise part of routine and to increase amount of physical activity.;Long Term: Exercising regularly at least 3-5 days a week.       Increase Strength and Stamina Yes       Intervention Provide advice, education, support and counseling about physical activity/exercise needs.;Develop an individualized exercise prescription for aerobic and resistive training based on initial evaluation findings, risk stratification, comorbidities and participant's personal goals.       Expected Outcomes Short Term: Increase workloads from initial exercise prescription for resistance, speed, and METs.;Short Term: Perform resistance training exercises routinely during rehab and add in resistance training at home;Long Term: Improve cardiorespiratory fitness, muscular endurance and strength as measured by increased METs and functional capacity ( )       Able to understand and use rate of perceived exertion (RPE) scale Yes       Intervention Provide education and explanation on how to use RPE scale       Expected Outcomes Short Term: Able to use RPE daily in rehab to express subjective intensity level;Long Term:  Able to use RPE to guide intensity level when exercising independently       Able to understand and use Dyspnea scale Yes       Intervention Provide education and explanation on how to use Dyspnea scale       Expected Outcomes Short Term: Able to use Dyspnea scale daily in rehab to express subjective sense of shortness of breath during exertion;Long Term: Able to use Dyspnea scale to guide intensity level when exercising independently       Knowledge and understanding of Target Heart Rate Range (THRR) Yes        Intervention Provide education and explanation of THRR including how the numbers were predicted and where they are located for reference       Expected Outcomes Short Term: Able to state/look up THRR;Long Term: Able to use THRR to govern intensity when exercising independently;Short Term: Able to use daily as guideline for intensity in rehab       Able to check pulse independently Yes       Intervention Provide education and demonstration on how to check pulse in carotid and radial arteries.;Review the importance of being able to check your own pulse for safety during independent exercise       Expected Outcomes Short Term: Able to explain why pulse checking is important during independent exercise;Long Term: Able to check pulse independently and accurately       Understanding of Exercise Prescription Yes  Intervention Provide education, explanation, and written materials on patient's individual exercise prescription       Expected Outcomes Short Term: Able to explain program exercise prescription;Long Term: Able to explain home exercise prescription to exercise independently                Exercise Goals Re-Evaluation :  Exercise Goals Re-Evaluation     Row Name 06/15/23 1724 06/30/23 1429 07/06/23 1733 07/12/23 1456 07/27/23 1440     Exercise Goal Re-Evaluation   Exercise Goals Review Increase Physical Activity;Able to understand and use rate of perceived exertion (RPE) scale;Knowledge and understanding of Target Heart Rate Range (THRR);Understanding of Exercise Prescription;Increase Strength and Stamina;Able to check pulse independently Increase Physical Activity;Increase Strength and Stamina;Understanding of Exercise Prescription Increase Physical Activity;Able to understand and use rate of perceived exertion (RPE) scale;Knowledge and understanding of Target Heart Rate Range (THRR);Understanding of Exercise Prescription;Increase Strength and Stamina;Able to understand and use Dyspnea  scale;Able to check pulse independently Increase Physical Activity;Increase Strength and Stamina;Understanding of Exercise Prescription Increase Physical Activity;Increase Strength and Stamina;Understanding of Exercise Prescription   Comments Reviewed RPE and dyspnea scale, THR and program prescription with pt today.  Pt voiced understanding and was given a copy of goals to take home. Bellamy is off to a good start in the program. He did well on the treadmill at a speed of 2.7 mph with an incline of 1%. He also improved to level 5 on both the XR and T4 nustep in just his first week of rehab! We will continue to monitor his progress in the program. Reviewed home exercise with pt today.  Pt plans to walk and use a stationary bike for exercise.  Reviewed THR, pulse, RPE, sign and symptoms, pulse oximetery and when to call 911 or MD.  Also discussed weather considerations and indoor options.  Pt voiced understanding. Joshua Leblanc is doing well in rehab. He has been able to increase his handweights to 8lb, and level on the XR to level 7. He was also able to increase his speed on the treadmill from 2.7 mph to 3 mph. We will continue to monitor his progress in the program. Joshua Leblanc continues to do well in rehab. He has been able to increase his level on the T5 nustep from level 3 to level 5. He was also able to increase his speed on the treadmill from 3 to 4 mph. We will continue to monitor his progress in the program.   Expected Outcomes Short: Use RPE daily to regulate intensity.  Long: Follow program prescription in THR. Short: Continue to follow current exercise prescription. Long: Continue exercise to improve strength and stamina. Short: buy a stationary bike and add 1-2 days a week of exercise at home on off days of rehab. Long: become independent with exercise routine. Short: Continue to follow current exercise prescription. Long: Continue exercise to improve strength and stamina. Short: Continue to follow current exercise  prescription. Long: Continue exercise to improve strength and stamina.    Row Name 08/11/23 1224 08/22/23 1730 08/25/23 0756 08/31/23 1747 09/07/23 1123     Exercise Goal Re-Evaluation   Exercise Goals Review Increase Physical Activity;Increase Strength and Stamina;Understanding of Exercise Prescription Increase Strength and Stamina;Understanding of Exercise Prescription Increase Strength and Stamina;Understanding of Exercise Prescription;Increase Physical Activity Increase Physical Activity;Increase Strength and Stamina;Understanding of Exercise Prescription;Improve claudication pain tolerance and improve walking ability Increase Physical Activity;Increase Strength and Stamina;Understanding of Exercise Prescription   Comments Joshua Leblanc is doing well in rehab. He was able to  increase to level 7 on both the T6 and T4 nustep. He did however slightly decline in workload on the treadmill, but we will continue to encourage and monitor his progression. Joshua Leblanc has had a hard time progressing with his workloads as he would like due to leg pain. He will be getting a stent put in leg tomorrow to help increase blood flow and hopefully reduce pain. He will be out the remainder of the week and plans to start back next week. He is also considering buying a treadmill for at home use to be able to increase to 4-5 days a week of exercise. Joshua Leblanc attended one session since the last review. He had a workload of a speed of with an incline of 0.5% on the treadmill and exercised at level 6 on the T5 nustep. Joshua Leblanc has been having issues with leg pain, which is interferring with wanting to progress his workloads, and he is getting a stent in his leg this week in hopes to reduce pain and increase blood flow. We will continue to monitor his progress in the program. Joshua Leblanc had a stent placed in his leg last week due to a great amount of PAD pain he experienced often with activity. This was his first exercise session since the stent  placement and he was happy to report that he was having no pain in his leg while walking. His PAD pain had been his main limiting factor in exercise progression. Now that he is not having that pain he should be able to progress more with his workloads to help him increase his strength and stamina with respect to his cardiovascular fitness. Joshua Leblanc is doing well in rehab. He is doing much better after his stent procedure. He was able to increase his speed and his incline on the treadmill to 3. and 1.5% grade. We will continue to monitor his progress in the program.   Expected Outcomes Short: Increase treadmill workload to previous levels. Long: Continue exercise to increase strength and stamina. Short: get stent put in leg this week. Long: reduced leg pain will help Joshua Leblanc to be able to increase his intensities more efficiently to make the gains he wants to see in his cardiovascular fitness. Short: Continue to progressively increase treadmill and nustep workloads once leg pain is reduced. Long: Continue exercise to improve strength and stamina. Short: begin to progress with workload intensities since pain in leg seems to have been resolved with stent placement. Long: contine to gain strength and stamina and become independent with exercise routine. Short: Continue to increase treadmill workloads. Long: Continue exercise to increase strength and stamina.    Row Name 09/20/23 1538 10/05/23 0816 10/12/23 1734         Exercise Goal Re-Evaluation   Exercise Goals Review Increase Physical Activity;Increase Strength and Stamina;Understanding of Exercise Prescription Increase Physical Activity;Increase Strength and Stamina;Understanding of Exercise Prescription Increase Physical Activity;Able to understand and use rate of perceived exertion (RPE) scale;Increase Strength and Stamina     Comments Joshua Leblanc continues to do well in rehab. He was able to increase his level on the biostep from 4 to 5. He was also able to  maintain both his speed and incline on the treadmill, as well as his workload on the T4 nustep. We will continue to monitor his progress in the program. Joshua Leblanc is doing well in rehab. He was able to increase to level 8 on the XR, from level 5. He was also able to maintain a level 7 on  the T4 nustep. We will continue to monitor his progrss in the program. Joshua Leblanc reports that he recently bought a stationary bike to use at home and has also started walking with his daughter. He was encouraged to become consistent with home exercise now that he has his new piece of equipment.     Expected Outcomes Short: Continue to increase treadmill workloads. Long: Continue exercise to increase strength and stamina. Short: Continue to increase treadmill workloads. Long: Continue exercise to increase strength and stamina. Short: start using new stationary bike at home for exercise. Long: maintain independent exercise routine upon graduation from cardiac rehab.              Discharge Exercise Prescription (Final Exercise Prescription Changes):  Exercise Prescription Changes - 10/05/23 0800       Response to Exercise   Blood Pressure (Admit) 118/66    Blood Pressure (Exit) 98/58    Heart Rate (Admit) 83 bpm    Heart Rate (Exercise) 103 bpm    Heart Rate (Exit) 80 bpm    Oxygen Saturation (Admit) 96 %    Oxygen Saturation (Exercise) 95 %    Oxygen Saturation (Exit) 97 %    Rating of Perceived Exertion (Exercise) 13    Perceived Dyspnea (Exercise) 0    Symptoms none    Duration Continue with 30 min of aerobic exercise without signs/symptoms of physical distress.    Intensity THRR unchanged      Progression   Progression Continue to progress workloads to maintain intensity without signs/symptoms of physical distress.    Average METs 4.65      Resistance Training   Training Prescription Yes    Weight 8 lb    Reps 10-15      Interval Training   Interval Training No      Treadmill   MPH 3.3    Grade 0.5     Minutes 15    METs 3.75      NuStep   Level 7    Minutes 15      REL-XR   Level 8    Minutes 15    METs 6.7      Home Exercise Plan   Plans to continue exercise at Home (comment)   walking and stationary bike   Frequency Add 2 additional days to program exercise sessions.    Initial Home Exercises Provided 07/06/23      Oxygen   Maintain Oxygen Saturation 88% or higher             Nutrition:  Target Goals: Understanding of nutrition guidelines, daily intake of sodium 1500mg , cholesterol 200mg , calories 30% from fat and 7% or less from saturated fats, daily to have 5 or more servings of fruits and vegetables.  Education: All About Nutrition: -Group instruction provided by verbal, written material, interactive activities, discussions, models, and posters to present general guidelines for heart healthy nutrition including fat, fiber, MyPlate, the role of sodium in heart healthy nutrition, utilization of the nutrition label, and utilization of this knowledge for meal planning. Follow up email sent as well. Written material given at graduation. Flowsheet Row Cardiac Rehab from 06/13/2023 in [facility] Cardiac [facility]  Education need identified 06/13/23       Biometrics:  Pre Biometrics - 06/13/23 1532       Pre Biometrics   Height 5\' 10"  (1.778 m)    Weight 179 lb 3.2 oz (81.3 kg)    Waist Circumference 35.5 inches  Hip Circumference 40.5 inches    Waist to Hip Ratio 0.88 %    BMI (Calculated) 25.71    Single Leg Stand 30 seconds              Nutrition Therapy Plan and Nutrition Goals:  Nutrition Therapy & Goals - 06/13/23 1541       Nutrition Therapy   Diet Cardiac, Low Na    Protein (specify units) 90    Fiber 30 grams    Whole Grain Foods 3 servings    Saturated Fats 15 max. grams    Fruits and Vegetables 5 servings/day    Sodium 2 grams      Personal Nutrition Goals   Nutrition Goal Pair a protein or healthy fat with a complex carb     Personal Goal #2 Continue to limit fried foods and fatty meats.    Personal Goal #3 Continue to watch sodium and saturated fat    Comments Patient drinking 48oz of water consistently. Has made a lot of changes to cut back on sodium, processed foods and sugar. Educated on types of fats, sources, and how to read a label. Reviewed 24hr food recall, spoke on ways to meet nutritional needs when appetite is down. Encouraged more balanced plates with a protein or healthy fat paired with a carb. Provided handout with examples and built out several meals and snacks with foods he likes and will eat focusing on reducing saturated fat and pairing proteins and carbs together.      Intervention Plan   Intervention Prescribe, educate and counsel regarding individualized specific dietary modifications aiming towards targeted core components such as weight, hypertension, lipid management, diabetes, heart failure and other comorbidities.;Nutrition handout(s) given to patient.    Expected Outcomes Short Term Goal: Understand basic principles of dietary content, such as calories, fat, sodium, cholesterol and nutrients.;Short Term Goal: A plan has been developed with personal nutrition goals set during dietitian appointment.;Long Term Goal: Adherence to prescribed nutrition plan.             Nutrition Assessments:  MEDIFICTS Score Key: >=70 Need to make dietary changes  40-70 Heart Healthy Diet <= 40 Therapeutic Level Cholesterol Diet  Flowsheet Row Cardiac Rehab from 06/15/2023 in [facility] Cardiac and Pulmonary Rehab  Picture Your Plate Total Score on Admission 63      Picture Your Plate Scores: <40 Unhealthy dietary pattern with much room for improvement. 41-50 Dietary pattern unlikely to meet recommendations for good health and room for improvement. 51-60 More healthful dietary pattern, with some room for improvement.  >60 Healthy dietary pattern, although there may be some specific behaviors that could be  improved.    Nutrition Goals Re-Evaluation:  Nutrition Goals Re-Evaluation     Row Name 07/20/23 1732 08/22/23 1735 09/19/23 1717 10/12/23 1726       Goals   Comment Joshua Leblanc states that he has been mindful of sodium intake. He does not add salt at the table and has started reading food labels. He reports drinking plenty of fluids. He does not drink sugary drinks but mostly flavored water with low sugar and sodium content. He was for the most part been avoiding fried food. Joshua Leblanc states that he continues to watch what he is eating and maintain a heart healthy diet. He does not eat fast food or fried foods anymore. He also has stopped adding salt to food. Joshua Leblanc is working on staying away from [employer] and eating more vegetables. He states that food is very  expensive and is trying to find food for cheaper that is not bad for him. Joshua Leblanc coninues to not eat fried foods or fast food. He reads food labels and watches the amount of sodium he intakes. He has started to incorporate more vegeables into his diet.    Expected Outcome Short: continue to read food labels and watch sodium intake. Long: maintain heart healthy diet to control cardiac risk factors. Short: read food labels and continue to reduce sodium intake. continue to not eat fast food or fried food. Long: maintain heart health diet long term to help control cardiac risk factors. Short: eat more vegetables. Long: maintain intake of healthier foods independently. Short: continue to read food labels and monitor sodium. Long: maintain heart healthy diet.      Personal Goal #2 Re-Evaluation   Personal Goal #2 Continue to limit fried foods and fatty meats. Continue to limit fried foods and fatty meats. -- --      Personal Goal #3 Re-Evaluation   Personal Goal #3 Continue to watch sodium and saturated fat Continue to watch sodium and saturated fat -- --             Nutrition Goals Discharge (Final Nutrition Goals Re-Evaluation):  Nutrition Goals  Re-Evaluation - 10/12/23 1726       Goals   Comment Joshua Leblanc coninues to not eat fried foods or fast food. He reads food labels and watches the amount of sodium he intakes. He has started to incorporate more vegeables into his diet.    Expected Outcome Short: continue to read food labels and monitor sodium. Long: maintain heart healthy diet.             Psychosocial: Target Goals: Acknowledge presence or absence of significant depression and/or stress, maximize coping skills, provide positive support system. Participant is able to verbalize types and ability to use techniques and skills needed for reducing stress and depression.   Education: Stress, Anxiety, and Depression - Group verbal and visual presentation to define topics covered.  Reviews how body is impacted by stress, anxiety, and depression.  Also discusses healthy ways to reduce stress and to treat/manage anxiety and depression.  Written material given at graduation.   Education: Sleep Hygiene -Provides group verbal and written instruction about how sleep can affect your health.  Define sleep hygiene, discuss sleep cycles and impact of sleep habits. Review good sleep hygiene tips.    Initial Review & Psychosocial Screening:  Initial Psych Review & Screening - 06/08/23 1422       Initial Review   Current issues with Current Stress Concerns    Source of Stress Concerns [employer]      Family Dynamics   Good Support System? Yes   family and girlfriend     Barriers   Psychosocial barriers to participate in program There are no identifiable barriers or psychosocial needs.;The patient should benefit from training in stress management and relaxation.      Screening Interventions   Interventions Encouraged to exercise;Provide feedback about the scores to participant;To provide support and resources with identified psychosocial needs    Expected Outcomes Short Term goal: Utilizing psychosocial counselor, staff and physician to  assist with identification of specific Stressors or current issues interfering with healing process. Setting desired goal for each stressor or current issue identified.;Long Term Goal: Stressors or current issues are controlled or eliminated.;Short Term goal: Identification and review with participant of any Quality of Life or Depression concerns found by scoring the questionnaire.;Long Term goal:  The participant improves quality of Life and PHQ9 Scores as seen by post scores and/or verbalization of changes             Quality of Life Scores:   Quality of Life - 06/13/23 1533       Quality of Life   Select Quality of Life      Quality of Life Scores   Health/Function Pre 13.23 %    Socioeconomic Pre 16.19 %    Psych/Spiritual Pre 15.43 %    Family Pre 24.8 %    GLOBAL Pre 16 %            Scores of 19 and below usually indicate a poorer quality of life in these areas.  A difference of  2-3 points is a clinically meaningful difference.  A difference of 2-3 points in the total score of the Quality of Life Index has been associated with significant improvement in overall quality of life, self-image, physical symptoms, and general health in studies assessing change in quality of life.  PHQ-9: Review Flowsheet       06/13/2023 09/25/2015 05/12/2015  Depression screen PHQ 2/9  Decreased Interest 1 3 2   Down, Depressed, Hopeless 1 3 0  PHQ - 2 Score 2 6 2   Altered sleeping 1 3 1   Tired, decreased energy 1 3 3   Change in appetite 0 0 1  Feeling bad or failure about yourself  0 1 3  Trouble concentrating 0 0 0  Moving slowly or fidgety/restless 0 0 0  Suicidal thoughts 0 0 0  PHQ-9 Score 4 13 10   Difficult doing work/chores Somewhat difficult Somewhat difficult Not difficult at all   Interpretation of Total Score  Total Score Depression Severity:  1-4 = Minimal depression, 5-9 = Mild depression, 10-14 = Moderate depression, 15-19 = Moderately severe depression, 20-27 = Severe  depression   Psychosocial Evaluation and Intervention:  Psychosocial Evaluation - 06/08/23 1437       Psychosocial Evaluation & Interventions   Interventions Encouraged to exercise with the program and follow exercise prescription;Stress management education;Relaxation education    Comments 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 He said this was a wake up call and has been making big lifestyle changes. He has his CDC, so he is on hold from working until he starts the program. He then will have to do a physical and undergo a stress test to be able to do his job fully. He states this has been a stressor being off of work since he supports himself. His family and girlfriend have been helpful in motivating him, but he is ready to get back to work and his normal routine. He has made a lot of diet changes and he has cut his smoking from 2 packs a day to about 5 cigarettes. He knows he needs to quit and is trying to. He has a history of anxiety and depression which he states is stable at this time. His biggest concern in getting back to work at full capacity and learning how to live a heart healthy lifestyle    Expected Outcomes Short: attend cardiac rehab for education and exercise. Long; Develop and maintain positive self care habits    Continue Psychosocial Services  Follow up required by staff             Psychosocial Re-Evaluation:  Psychosocial Re-Evaluation     Row Name 07/20/23 1747 08/11/23 1744 09/19/23 1720 10/12/23 1732  Psychosocial Re-Evaluation   Current issues with Current Stress Concerns Current Anxiety/Panic;Current Depression;Current Sleep Concerns Current Anxiety/Panic;Current Depression;Current Sleep Concerns Current Anxiety/Panic;Current Depression    Comments Patient reports that he has no changes in his current stress, sleep, or mental health. He does have a support system with his girlfriend and sister who he can relay on for support. Joshua Leblanc reports  that he has been dealing with increased anxiety/panic and depression related to his heart and newly discovered blockage. It has been affecting his sleep as he wakes up in a panic and is having fears of death due to this diagnosis. I provided Joshua Leblanc with our mental health resources in the area as he is seeking out treatment for these psychosocial issues. Joshua Leblanc states his anxiety is down alot and able to wrap his mind arounds having a heart event. He is ready to exercise at home and is looking for equipment to help his health and his mood. Joshua Leblanc reports that he is now sleeping really good. He gets 8-9 hours of sleep a night. He reports no new changes or concerns in his stress and mental health. He states he has normal work stresses but is able to [employer] with daily life.    Expected Outcomes Short: continue to attend cardiac rehab to gain mental health benefits from exericse. Long: maintain good mental health habits. Short: Continue to attend cardiac rehab to gain mental health benefits from exercise and find counseling regarding his depression and anxiety. Long: Continue treatment related to depression and anxiety. Short: Continue to attend [facility] regularly for regular exercise and social engagement. Long: Continue to improve symptoms and manage a positive mental state. Short: Continue to attend [facility] for mental health benefits of exercise. Long: Continue to improve symptoms and manage a positive mental state.    Interventions Encouraged to attend Cardiac Rehabilitation for the exercise Encouraged to attend Cardiac Rehabilitation for the exercise;Therapist referral  provided mental health resources Encouraged to attend Cardiac Rehabilitation for the exercise Encouraged to attend Cardiac Rehabilitation for the exercise    Continue Psychosocial Services  Follow up required by staff Follow up required by staff Follow up required by staff Follow up required by staff             Psychosocial Discharge  (Final Psychosocial Re-Evaluation):  Psychosocial Re-Evaluation - 10/12/23 1732       Psychosocial Re-Evaluation   Current issues with Current Anxiety/Panic;Current Depression    Comments Joshua Leblanc reports that he is now sleeping really good. He gets 8-9 hours of sleep a night. He reports no new changes or concerns in his stress and mental health. He states he has normal work stresses but is able to [employer] with daily life.    Expected Outcomes Short: Continue to attend [facility] for mental health benefits of exercise. Long: Continue to improve symptoms and manage a positive mental state.    Interventions Encouraged to attend Cardiac Rehabilitation for the exercise    Continue Psychosocial Services  Follow up required by staff             Vocational Rehabilitation: Provide vocational rehab assistance to qualifying candidates.   Vocational Rehab Evaluation & Intervention:  Vocational Rehab - 06/13/23 1534       Initial Vocational Rehab Evaluation & Intervention   Assessment shows need for Vocational Rehabilitation No             Education: Education Goals: Education classes will be provided on a variety of topics geared toward better understanding of heart  health and risk factor modification. Participant will state understanding/return demonstration of topics presented as noted by education test scores.  Learning Barriers/Preferences:  Learning Barriers/Preferences - 06/08/23 1421       Learning Barriers/Preferences   Learning Barriers None    Learning Preferences None             General Cardiac Education Topics:  AED/CPR: - Group verbal and written instruction with the use of models to demonstrate the basic use of the AED with the basic ABC's of resuscitation.   Anatomy and Cardiac Procedures: - Group verbal and visual presentation and models provide information about basic cardiac anatomy and function. Reviews the testing methods done to diagnose heart disease  and the outcomes of the test results. Describes the treatment choices: Medical Management, Angioplasty, or Coronary Bypass Surgery for treating various heart conditions including Myocardial Infarction, Angina, Valve Disease, and Cardiac Arrhythmias.  Written material given at graduation. Flowsheet Row Cardiac Rehab from 06/13/2023 in [facility] Cardiac and Pulmonary Rehab  Education need identified 06/13/23       Medication Safety: - Group verbal and visual instruction to review commonly prescribed medications for heart and lung disease. Reviews the medication, class of the drug, and side effects. Includes the steps to properly store meds and maintain the prescription regimen.  Written material given at graduation.   Intimacy: - Group verbal instruction through game format to discuss how heart and lung disease can affect sexual intimacy. Written material given at graduation..   Know Your Numbers and Heart Failure: - Group verbal and visual instruction to discuss disease risk factors for cardiac and pulmonary disease and treatment options.  Reviews associated critical values for Overweight/Obesity, Hypertension, Cholesterol, and Diabetes.  Discusses basics of heart failure: signs/symptoms and treatments.  Introduces Heart Failure Zone chart for action plan for heart failure.  Written material given at graduation. Flowsheet Row Cardiac Rehab from 06/13/2023 in [facility] Cardiac and Pulmonary Rehab  Education need identified 06/13/23       Infection Prevention: - Provides verbal and written material to individual with discussion of infection control including proper hand washing and proper equipment cleaning during exercise session. Flowsheet Row Cardiac Rehab from 06/13/2023 in [facility] Cardiac and Pulmonary Rehab  Date 06/13/23  Educator [facility]  Instruction Review Code 1- Verbalizes Understanding       Falls Prevention: - Provides verbal and written material to individual with discussion of falls  prevention and safety. Flowsheet Row Cardiac Rehab from 06/13/2023 in [facility] Cardiac and Pulmonary Rehab  Date 06/13/23  Educator [facility]  Instruction Review Code 1- Verbalizes Understanding       Other: -Provides group and verbal instruction on various topics (see comments)   Knowledge Questionnaire Score:  Knowledge Questionnaire Score - 06/13/23 1534       Knowledge Questionnaire Score   Pre Score 22/26             Core Components/Risk Factors/Patient Goals at Admission:  Personal Goals and Risk Factors at Admission - 06/13/23 1526       Core Components/Risk Factors/Patient Goals on Admission    Weight Management Yes    Intervention Weight Management: Develop a combined nutrition and exercise program designed to reach desired caloric intake, while maintaining appropriate intake of nutrient and fiber, sodium and fats, and appropriate energy expenditure required for the weight goal.;Weight Management: Provide education and appropriate resources to help participant work on and attain dietary goals.;Weight Management/Obesity: Establish reasonable short term and long term weight goals.  Admit Weight 179 lb 3.2 oz (81.3 kg)    Goal Weight: Short Term 179 lb (81.2 kg)    Goal Weight: Long Term 179 lb (81.2 kg)    Expected Outcomes Short Term: Continue to assess and modify interventions until short term weight is achieved;Long Term: Adherence to nutrition and physical activity/exercise program aimed toward attainment of established weight goal;Weight Maintenance: Understanding of the daily nutrition guidelines, which includes 25-35% calories from fat, 7% or less cal from saturated fats, less than 200mg  cholesterol, less than 1.5gm of sodium, & 5 or more servings of fruits and vegetables daily;Understanding recommendations for meals to include 15-35% energy as protein, 25-35% energy from fat, 35-60% energy from carbohydrates, less than 200mg  of dietary cholesterol, 20-35 gm of total fiber  daily;Understanding of distribution of calorie intake throughout the day with the consumption of 4-5 meals/snacks    Tobacco Cessation Yes    Number of packs per day 1/2    Intervention Assist the participant in steps to quit. Provide individualized education and counseling about committing to Tobacco Cessation, relapse prevention, and pharmacological support that can be provided by physician.;Education officer, environmental, assist with locating and accessing local/national Quit Smoking programs, and support quit date choice.    Expected Outcomes Short Term: Will demonstrate readiness to quit, by selecting a quit date.;Short Term: Will quit all tobacco product use, adhering to prevention of relapse plan.;Long Term: Complete abstinence from all tobacco products for at least 12 months from quit date.    Intervention Provide education and support for participant on nutrition & aerobic/resistive exercise along with prescribed medications to achieve LDL 70mg , HDL >40mg .    Expected Outcomes Short Term: Participant states understanding of desired cholesterol values and is compliant with medications prescribed. Participant is following exercise prescription and nutrition guidelines.;Long Term: Cholesterol controlled with medications as prescribed, with individualized exercise RX and with personalized nutrition plan. Value goals: LDL < 70mg , HDL > 40 mg.             Education:Diabetes - Individual verbal and written instruction to review signs/symptoms of diabetes, desired ranges of glucose level fasting, after meals and with exercise. Acknowledge that pre and post exercise glucose checks will be done for 3 sessions at entry of program.   Core Components/Risk Factors/Patient Goals Review:   Goals and Risk Factor Review     Row Name 06/13/23 1526 07/20/23 1743 08/22/23 1738 09/19/23 1712 10/12/23 1729     Core Components/Risk Factors/Patient Goals Review   Personal Goals Review Tobacco Cessation  Tobacco Cessation Tobacco Cessation;Lipids Tobacco Cessation Lipids;Tobacco Cessation   Review Joshua Leblanc is a current tobacco user. Intervention for tobacco cessation was provided at the initial medical review. He was asked about readiness to quit and reported he has cut back to a half pack a day and is considering the steps he needs to take to quit smoking . Patient was advised and educated about tobacco cessation using combination therapy, tobacco cessation classes, quit line, and quit smoking apps. Patient demonstrated understanding of this material. Staff will continue to provide encouragement and follow up with the patient throughout the program. Joshua Leblanc reports that he is still smoking about 1/2 pack a day. He has in the past used the patch and been able to cut back more. He is looking possibly using the patch again to aid in trying to cut pack. Other smoking cessation options were discussed with patient. He is also planning to buy fake cigarettes to help with the hand to mouth habit  of smoking. Joshua Leblanc reports that he continues to take all cholesterol meds as prescribed. He follows up with his doctor regularly for required lab work. Joshua Leblanc has cut back to 8 cigarettes a day. He started by stopping smoking indoors, then his vehicle, and now he feels ready to quit. He did get the patch and plans to start using it tomorrow quit all together. Joshua Leblanc has cut back on his smoking. Today he has got down to 7 cigarettes in the last 24 hours. He is trying some candies and patches this week. He is working on quitting all together and wants to as soon as he can. Joshua Leblanc states that he takes all his cholesterol meds and follows up with his doctor to [facility] his lipid levels. He reports no concerns with this. Smoking cessation continues to be a struggle. He is back up to about 11-12 cigarettes a day. He recently talked to his doctor about using gum and lozenges and plans to start using them. He is also planning to use the Quit line  for help with smoking cessation.   Expected Outcomes -- Short: start using the patch and fake cigarettes to help cut back on smoking. Long: become tobacco free. Short: start  using the patch and follow though with quit date, which is tomorrow 08/23/2023. Long: continue to control cholesterol and become tobacco free long term. Short: reduce smoking even further. Long: quit tobacco all together. Short: call the quit line. Long: quit tobacco use            Core Components/Risk Factors/Patient Goals at Discharge (Final Review):   Goals and Risk Factor Review - 10/12/23 1729       Core Components/Risk Factors/Patient Goals Review   Personal Goals Review Lipids;Tobacco Cessation    Review Joshua Leblanc states that he takes all his cholesterol meds and follows up with his doctor to [facility] his lipid levels. He reports no concerns with this. Smoking cessation continues to be a struggle. He is back up to about 11-12 cigarettes a day. He recently talked to his doctor about using gum and lozenges and plans to start using them. He is also planning to use the Quit line for help with smoking cessation.    Expected Outcomes Short: call the quit line. Long: quit tobacco use             ITP Comments:  ITP Comments     Row Name 06/08/23 1429 06/13/23 1523 06/15/23 1723 06/29/23 1146 07/27/23 1006   ITP Comments Initial phone call completed. Diagnosis can be found in [facility] 10/12. EP Orientation scheduled for Monday 11/4 1:30.   Joshua Leblanc is a current tobacco user. Intervention for tobacco cessation was provided at the initial medical review. He was asked about readiness to quit and reported that he is ready to start trying. He has reduced his smoking from 2 packs to 5 cigs a day . Patient was advised and educated about tobacco cessation using combination therapy, tobacco cessation classes, quit line, and quit smoking apps. Patient demonstrated understanding of this material. Staff will continue to provide encouragement  and follow up with the patient throughout the program. Completed and gym orientation. Initial ITP created and sent for review to Dr. Bethann Punches, Medical Director. Joshua Leblanc is a current tobacco user. Intervention for tobacco cessation was provided at the initial medical review. He was asked about readiness to quit and reported he has cut back to a half pack a day and is considering the steps he needs to  take to quit smoking . Patient was advised and educated about tobacco cessation using combination therapy, tobacco cessation classes, quit line, and quit smoking apps. Patient demonstrated understanding of this material. Staff will continue to provide encouragement and follow up with the patient throughout the program. First full day of exercise!  Patient was oriented to gym and equipment including functions, settings, policies, and procedures.  Patient's individual exercise prescription and treatment plan were reviewed.  All starting workloads were established based on the results of the 6 minute walk test done at initial orientation visit.  The plan for exercise progression was also introduced and progression will be customized based on patient's performance and goals. 30 Day review completed. Medical Director ITP review done, changes made as directed, and signed approval by Medical Director.    new to program 30 Day review completed. Medical Director ITP review done, changes made as directed, and signed approval by Medical Director.    Row Name 08/24/23 1313 09/21/23 1129 10/19/23 1029       ITP Comments 30 Day review completed. Medical Director ITP review done, changes made as directed, and signed approval by Medical Director. 30 Day review completed. Medical Director ITP review done, changes made as directed, and signed approval by Medical Director. 30 Day review completed. Medical Director ITP review done, changes made as directed, and signed approval by Medical Director.              Comments:  30 day review

## 2023-10-19 NOTE — Progress Notes (Signed)
 Daily Session Note  Patient Details  Name: Joshua Leblanc. MRN: [id number] Date of Birth: [birth year] Referring Provider:   Flowsheet Row Cardiac Rehab from 06/13/2023 in [facility] Cardiac and Pulmonary Rehab  Referring Provider Rita Ohara       Encounter Date: 10/19/2023  Check In:  Session Check In - 10/19/23 1717       Check-In   Supervising physician immediately available to respond to emergencies See telemetry face sheet for immediately available ER MD    Location [facility]-Cardiac & Pulmonary Rehab    Staff Present Susann Givens RN,BSN;Joseph [facility] BS, ACSM CEP, Exercise Physiologist    Virtual Visit No    Medication changes reported     No    Fall or balance concerns reported    No    Tobacco Cessation Use Increase    Current number of cigarettes/nicotine per day     15    Warm-up and Cool-down Performed on first and last piece of equipment    Resistance Training Performed Yes    VAD Patient? No    PAD/SET Patient? No      Pain Assessment   Currently in Pain? No/denies                Social History   Tobacco Use  Smoking Status Every Day   Current packs/day: 1.00   Average packs/day: 1 pack/day for 15.0 years (15.0 ttl pk-yrs)   Types: Cigarettes  Smokeless Tobacco Never    Goals Met:  Independence with exercise equipment Exercise tolerated well No report of concerns or symptoms today Strength training completed today  Goals Unmet:  Not Applicable  Comments: Pt able to follow exercise prescription today without complaint.  Will continue to monitor for progression.   6 Minute Walk     Row Name 06/13/23 1527 10/19/23 1730       6 Minute Walk   Phase Initial Discharge    Distance 1280 feet 1590 feet    Distance % Change -- 24 %    Distance Feet Change -- 310 ft    Walk Time 6 minutes 6 minutes    # of Rest Breaks 0 0    MPH 2.42 3.01    METS 4.3 4.89    RPE 9 12    Perceived Dyspnea  0 1    VO2 Peak  15.1 17.11    Symptoms No No    Resting HR 70 bpm 76 bpm    Resting BP 112/62 140/78    Resting Oxygen Saturation  99 % 96 %    Exercise Oxygen Saturation  during 6 min walk 98 % 91 %    Max Ex. HR 88 bpm 91 bpm    Max Ex. BP 146/70 140/68    2 Minute Post BP 120/70 120/70                Dr. Bethann Punches is Medical Director for [facility] Cardiac Rehabilitation.  Dr. Vida Rigger is Medical Director for [facility] Pulmonary Rehabilitation.

## 2023-10-19 NOTE — Patient Instructions (Signed)
 Discharge Patient Instructions  Patient Details  Name: Joshua Leblanc. MRN: [id number] Date of Birth: 05-15-1976 Referring Provider:  [facility] [place] [facility]   Number of Visits: 36  Reason for Discharge:  Patient reached a stable level of exercise. Patient independent in their exercise. Patient has met program and personal goals.  Smoking History:  Social History   Tobacco Use  Smoking Status Every Day   Current packs/day: 1.00   Average packs/day: 1 pack/day for 15.0 years (15.0 ttl pk-yrs)   Types: Cigarettes  Smokeless Tobacco Never    Diagnosis:  ST elevation myocardial infarction (STEMI), unspecified artery (HCC)  Status post coronary artery stent placement  Initial Exercise Prescription:  Initial Exercise Prescription - 06/13/23 1500       Date of Initial Exercise RX and Referring Provider   Date 06/13/23    Referring Provider Rita Ohara      Oxygen   Maintain Oxygen Saturation 88% or higher      Treadmill   MPH 2.7    Grade 1    Minutes 15    METs 4.3      NuStep   Level 3    SPM 80    Minutes 15    METs 4.3      Elliptical   Level 1    Speed 3    Minutes 15    METs 4.3      REL-XR   Level 3    Speed 50    Minutes 15    METs 4.3      T5 Nustep   Level 3    SPM 80    Minutes 15    METs 4.3      Prescription Details   Frequency (times per week) 3    Duration Progress to 30 minutes of continuous aerobic without signs/symptoms of physical distress      Intensity   THRR 40-80% of Max Heartrate 111-153    Ratings of Perceived Exertion 11-13    Perceived Dyspnea 0-4      Progression   Progression Continue to progress workloads to maintain intensity without signs/symptoms of physical distress.      Resistance Training   Training Prescription Yes    Weight 4    Reps 10-15             Discharge Exercise Prescription (Final Exercise Prescription Changes):  Exercise Prescription Changes - 10/05/23 0800        Response to Exercise   Blood Pressure (Admit) 118/66    Blood Pressure (Exit) 98/58    Heart Rate (Admit) 83 bpm    Heart Rate (Exercise) 103 bpm    Heart Rate (Exit) 80 bpm    Oxygen Saturation (Admit) 96 %    Oxygen Saturation (Exercise) 95 %    Oxygen Saturation (Exit) 97 %    Rating of Perceived Exertion (Exercise) 13    Perceived Dyspnea (Exercise) 0    Symptoms none    Duration Continue with 30 min of aerobic exercise without signs/symptoms of physical distress.    Intensity THRR unchanged      Progression   Progression Continue to progress workloads to maintain intensity without signs/symptoms of physical distress.    Average METs 4.65      Resistance Training   Training Prescription Yes    Weight 8 lb    Reps 10-15      Interval Training   Interval Training No      Treadmill  MPH 3.3    Grade 0.5    Minutes 15    METs 3.75      NuStep   Level 7    Minutes 15      REL-XR   Level 8    Minutes 15    METs 6.7      Home Exercise Plan   Plans to continue exercise at Home (comment)   walking and stationary bike   Frequency Add 2 additional days to program exercise sessions.    Initial Home Exercises Provided 07/06/23      Oxygen   Maintain Oxygen Saturation 88% or higher             Functional Capacity:  6 Minute Walk     Row Name 06/13/23 1527 10/19/23 1730       6 Minute Walk   Phase Initial Discharge    Distance 1280 feet 1590 feet    Distance % Change -- 24 %    Distance Feet Change -- 310 ft    Walk Time 6 minutes 6 minutes    # of Rest Breaks 0 0    MPH 2.42 3.01    METS 4.3 4.89    RPE 9 12    Perceived Dyspnea  0 1    VO2 Peak 15.1 17.11    Symptoms No No    Resting HR 70 bpm 76 bpm    Resting BP 112/62 140/78    Resting Oxygen Saturation  99 % 96 %    Exercise Oxygen Saturation  during 6 min walk 98 % 91 %    Max Ex. HR 88 bpm 91 bpm    Max Ex. BP 146/70 140/68    2 Minute Post BP 120/70 120/70            Nutrition &  Weight - Outcomes:  Pre Biometrics - 06/13/23 1532       Pre Biometrics   Height 5\' 10"  (1.778 m)    Weight 179 lb 3.2 oz (81.3 kg)    Waist Circumference 35.5 inches    Hip Circumference 40.5 inches    Waist to Hip Ratio 0.88 %    BMI (Calculated) 25.71    Single Leg Stand 30 seconds             Post Biometrics - 10/19/23 1734        Post  Biometrics   Height 5\' 10"  (1.778 m)    Weight 174 lb 9.6 oz (79.2 kg)    Waist Circumference 34 inches    Hip Circumference 39 inches    Waist to Hip Ratio 0.87 %    BMI (Calculated) 25.05    Single Leg Stand 30 seconds             Nutrition:  Nutrition Therapy & Goals - 06/13/23 1541       Nutrition Therapy   Diet Cardiac, Low Na    Protein (specify units) 90    Fiber 30 grams    Whole Grain Foods 3 servings    Saturated Fats 15 max. grams    Fruits and Vegetables 5 servings/day    Sodium 2 grams      Personal Nutrition Goals   Nutrition Goal Pair a protein or healthy fat with a complex carb    Personal Goal #2 Continue to limit fried foods and fatty meats.    Personal Goal #3 Continue to watch sodium and saturated fat    Comments Patient  drinking 48oz of water consistently. Has made a lot of changes to cut back on sodium, processed foods and sugar. Educated on types of fats, sources, and how to read a label. Reviewed 24hr food recall, spoke on ways to meet nutritional needs when appetite is down. Encouraged more balanced plates with a protein or healthy fat paired with a carb. Provided handout with examples and built out several meals and snacks with foods he likes and will eat focusing on reducing saturated fat and pairing proteins and carbs together.      Intervention Plan   Intervention Prescribe, educate and counsel regarding individualized specific dietary modifications aiming towards targeted core components such as weight, hypertension, lipid management, diabetes, heart failure and other comorbidities.;Nutrition  handout(s) given to patient.    Expected Outcomes Short Term Goal: Understand basic principles of dietary content, such as calories, fat, sodium, cholesterol and nutrients.;Short Term Goal: A plan has been developed with personal nutrition goals set during dietitian appointment.;Long Term Goal: Adherence to prescribed nutrition plan.             Nutrition Discharge:   Education Questionnaire Score:  Knowledge Questionnaire Score - 06/13/23 1534       Knowledge Questionnaire Score   Pre Score 22/26             Goals reviewed with patient; copy given to patient.

## 2023-10-20 ENCOUNTER — Encounter: Payer: Self-pay | Admitting: *Deleted

## 2023-10-20 DIAGNOSIS — I213 ST elevation (STEMI) myocardial infarction of unspecified site: Secondary | ICD-10-CM

## 2023-10-20 DIAGNOSIS — Z955 Presence of coronary angioplasty implant and graft: Secondary | ICD-10-CM

## 2023-10-20 NOTE — Progress Notes (Signed)
 Daily Session Note  Patient Details  Name: Joshua Leblanc. MRN: [id number] Date of Birth: 04-05-1976 Referring Provider:   Flowsheet Row Cardiac Rehab from 06/13/2023 in [facility] Cardiac and Pulmonary Rehab  Referring Provider Rita Ohara       Encounter Date: 10/20/2023  Check In:  Session Check In - 10/20/23 1706       Check-In   Supervising physician immediately available to respond to emergencies See telemetry face sheet for immediately available ER MD    Location [facility]-Cardiac & Pulmonary Rehab    Staff Present Susann Givens RN,BSN;Joseph [facility] BS, Exercise Physiologist    Virtual Visit No    Medication changes reported     No    Fall or balance concerns reported    No    Tobacco Cessation No Change    Current number of cigarettes/nicotine per day     15    Warm-up and Cool-down Performed on first and last piece of equipment    Resistance Training Performed Yes    VAD Patient? No    PAD/SET Patient? No      Pain Assessment   Currently in Pain? No/denies                Social History   Tobacco Use  Smoking Status Every Day   Current packs/day: 1.00   Average packs/day: 1 pack/day for 15.0 years (15.0 ttl pk-yrs)   Types: Cigarettes  Smokeless Tobacco Never    Goals Met:  Independence with exercise equipment Exercise tolerated well No report of concerns or symptoms today Strength training completed today  Goals Unmet:  Not Applicable  Comments: Pt able to follow exercise prescription today without complaint.  Will continue to monitor for progression.    Dr. Bethann Punches is Medical Director for [facility] Cardiac Rehabilitation.  Dr. Vida Rigger is Medical Director for [facility] Pulmonary Rehabilitation.

## 2023-10-24 ENCOUNTER — Encounter: Payer: Self-pay | Admitting: *Deleted

## 2023-10-24 DIAGNOSIS — Z955 Presence of coronary angioplasty implant and graft: Secondary | ICD-10-CM

## 2023-10-24 DIAGNOSIS — I213 ST elevation (STEMI) myocardial infarction of unspecified site: Secondary | ICD-10-CM

## 2023-10-24 NOTE — Progress Notes (Signed)
 Daily Session Note  Patient Details  Name: Joshua Leblanc. MRN: [id number] Date of Birth: 1976-07-22 Referring Provider:   Flowsheet Row Cardiac Rehab from 06/13/2023 in [facility] Cardiac and Pulmonary Rehab  Referring Provider Rita Ohara       Encounter Date: 10/24/2023  Check In:  Session Check In - 10/24/23 1726       Check-In   Supervising physician immediately available to respond to emergencies See telemetry face sheet for immediately available ER MD    Location [facility]-Cardiac & Pulmonary Rehab    Staff Present Elige Ko RCP,RRT,BSRT;Franz Svec Jewel Baize RN,BSN;Laureen Manson Passey, BS, RRT, CPFT;Kelly Madilyn Fireman BS, ACSM CEP, Exercise Physiologist    Virtual Visit No    Medication changes reported     No    Fall or balance concerns reported    No    Tobacco Cessation No Change    Current number of cigarettes/nicotine per day     15    Warm-up and Cool-down Performed on first and last piece of equipment    Resistance Training Performed Yes    VAD Patient? No    PAD/SET Patient? No      Pain Assessment   Currently in Pain? No/denies                Social History   Tobacco Use  Smoking Status Every Day   Current packs/day: 1.00   Average packs/day: 1 pack/day for 15.0 years (15.0 ttl pk-yrs)   Types: Cigarettes  Smokeless Tobacco Never    Goals Met:  Independence with exercise equipment Exercise tolerated well No report of concerns or symptoms today Strength training completed today  Goals Unmet:  Not Applicable  Comments: Pt able to follow exercise prescription today without complaint.  Will continue to monitor for progression.    Dr. Bethann Punches is Medical Director for [facility] Cardiac Rehabilitation.  Dr. Vida Rigger is Medical Director for [facility] Pulmonary Rehabilitation.

## 2023-10-26 ENCOUNTER — Encounter: Payer: Self-pay | Admitting: *Deleted

## 2023-10-26 DIAGNOSIS — I213 ST elevation (STEMI) myocardial infarction of unspecified site: Secondary | ICD-10-CM

## 2023-10-26 DIAGNOSIS — Z955 Presence of coronary angioplasty implant and graft: Secondary | ICD-10-CM

## 2023-10-26 NOTE — Progress Notes (Unsigned)
 Daily Session Note  Patient Details  Name: Joshua Leblanc. MRN: [id number] Date of Birth: [birth year] Referring Provider:   Flowsheet Row Cardiac Rehab from 06/13/2023 in [facility] Cardiac and Pulmonary Rehab  Referring Provider Rita Ohara       Encounter Date: 10/26/2023  Check In:  Session Check In - 10/26/23 1714       Check-In   Supervising physician immediately available to respond to emergencies See telemetry face sheet for immediately available ER MD    Location [facility]-Cardiac & Pulmonary Rehab    Staff Present Susann Givens RN,BSN;Joseph [facility] BS, ACSM CEP, Exercise Physiologist    Virtual Visit No    Medication changes reported     No    Fall or balance concerns reported    No    Tobacco Cessation No Change    Current number of cigarettes/nicotine per day     15    Warm-up and Cool-down Performed on first and last piece of equipment    Resistance Training Performed Yes    VAD Patient? No    PAD/SET Patient? No      Pain Assessment   Currently in Pain? No/denies                Social History   Tobacco Use  Smoking Status Every Day   Current packs/day: 1.00   Average packs/day: 1 pack/day for 15.0 years (15.0 ttl pk-yrs)   Types: Cigarettes  Smokeless Tobacco Never    Goals Met:  Independence with exercise equipment Exercise tolerated well No report of concerns or symptoms today Strength training completed today  Goals Unmet:  Not Applicable  Comments: Pt able to follow exercise prescription today without complaint.  Will continue to monitor for progression.    Dr. Bethann Punches is Medical Director for [facility] Cardiac Rehabilitation.  Dr. Vida Rigger is Medical Director for [facility] Pulmonary Rehabilitation.

## 2023-10-27 ENCOUNTER — Encounter: Payer: Self-pay | Admitting: *Deleted

## 2023-10-27 DIAGNOSIS — I213 ST elevation (STEMI) myocardial infarction of unspecified site: Secondary | ICD-10-CM

## 2023-10-27 DIAGNOSIS — Z955 Presence of coronary angioplasty implant and graft: Secondary | ICD-10-CM

## 2023-10-27 NOTE — Progress Notes (Signed)
 Daily Session Note  Patient Details  Name: Joshua Leblanc. MRN: [id number] Date of Birth: November 19, 1975 Referring Provider:   Flowsheet Row Cardiac Rehab from 06/13/2023 in [facility] Cardiac and Pulmonary Rehab  Referring Provider Rita Ohara       Encounter Date: 10/27/2023  Check In:  Session Check In - 10/27/23 1724       Check-In   Supervising physician immediately available to respond to emergencies Virtual Visit, No Supervising Physician Required.    Location [facility]-Cardiac & Pulmonary Rehab    Staff Present Susann Givens RN,BSN;Joseph [facility] BS, Exercise Physiologist    Virtual Visit No    Medication changes reported     No    Fall or balance concerns reported    No    Tobacco Cessation No Change    Current number of cigarettes/nicotine per day     15    Warm-up and Cool-down Performed on first and last piece of equipment    Resistance Training Performed Yes    VAD Patient? No    PAD/SET Patient? No      Pain Assessment   Currently in Pain? No/denies                Social History   Tobacco Use  Smoking Status Every Day   Current packs/day: 1.00   Average packs/day: 1 pack/day for 15.0 years (15.0 ttl pk-yrs)   Types: Cigarettes  Smokeless Tobacco Never    Goals Met:  Independence with exercise equipment Exercise tolerated well No report of concerns or symptoms today Strength training completed today  Goals Unmet:  Not Applicable  Comments:  Leevon graduated today from  rehab with 35 sessions completed.  Details of the patient's exercise prescription and what He needs to do in order to continue the prescription and progress were discussed with patient.  Patient was given a copy of prescription and goals.  Patient verbalized understanding. Alim plans to continue to exercise by walking and using stationary bike.   Dr. Bethann Punches is Medical Director for [facility] Cardiac Rehabilitation.  Dr. Vida Rigger is  Medical Director for [facility] Pulmonary Rehabilitation.

## 2023-10-27 NOTE — Progress Notes (Signed)
 Discharge Summary   Joshua Leblanc.  DOB: Dec 10, 1975  Joshua Leblanc graduated today from  rehab with 35 sessions completed.  Details of the patient's exercise prescription and what He needs to do in order to continue the prescription and progress were discussed with patient.  Patient was given a copy of prescription and goals.  Patient verbalized understanding. Joshua Leblanc plans to continue to exercise by walking and using stationary bike.   6 Minute Walk     Row Name 06/13/23 1527 10/19/23 1730       6 Minute Walk   Phase Initial Discharge    Distance 1280 feet 1590 feet    Distance % Change -- 24 %    Distance Feet Change -- 310 ft    Walk Time 6 minutes 6 minutes    # of Rest Breaks 0 0    MPH 2.42 3.01    METS 4.3 4.89    RPE 9 12    Perceived Dyspnea  0 1    VO2 Peak 15.1 17.11    Symptoms No No    Resting HR 70 bpm 76 bpm    Resting BP 112/62 140/78    Resting Oxygen Saturation  99 % 96 %    Exercise Oxygen Saturation  during 6 min walk 98 % 91 %    Max Ex. HR 88 bpm 91 bpm    Max Ex. BP 146/70 140/68    2 Minute Post BP 120/70 120/70

## 2023-10-27 NOTE — Progress Notes (Signed)
 Cardiac Individual Treatment Plan  Patient Details  Name: Joshua Leblanc. MRN: [id number] Date of Birth: Mar 08, 1976 Referring Provider:   Flowsheet Row Cardiac Rehab from 06/13/2023 in [facility] Cardiac and Pulmonary Rehab  Referring Provider Rita Ohara       Initial Encounter Date:  Flowsheet Row Cardiac Rehab from 06/13/2023 in [facility] Cardiac and Pulmonary Rehab  Date 06/13/23       Visit Diagnosis: ST elevation myocardial infarction (STEMI), unspecified artery [facility])  Status post coronary artery stent placement  Patient's Home Medications on Admission:  Current Outpatient Medications:    aspirin EC (ASPIRIN 81) 81 MG tablet, Take 1 tablet (81 mg total) by mouth daily., Disp: 60 tablet, Rfl: 0   aspirin EC (ASPIRIN 81) 81 MG tablet, Take 1 tablet (81 mg total) by mouth daily., Disp: 60 tablet, Rfl: 0   aspirin EC 81 MG tablet, Take 1 tablet (81 mg total) by mouth daily. Swallow whole., Disp: 60 tablet, Rfl: 0   atorvastatin (LIPITOR) 80 MG tablet, Take 1 tablet (80 mg total) by mouth daily., Disp: 30 tablet, Rfl: 5   atorvastatin (LIPITOR) 80 MG tablet, Take 1 tablet (80 mg total) by mouth once daily, Disp: 30 tablet, Rfl: 5   atorvastatin (LIPITOR) 80 MG tablet, Take 1 tablet (80 mg total) by mouth daily., Disp: 30 tablet, Rfl: 5   atorvastatin (LIPITOR) 80 MG tablet, Take 1 tablet (80 mg total) by mouth daily., Disp: 30 tablet, Rfl: 5   atorvastatin (LIPITOR) 80 MG tablet, Take 1 tablet (80 mg total) by mouth daily., Disp: 30 tablet, Rfl: 5   cephALEXin (KEFLEX) 500 MG capsule, Take by mouth., Disp: , Rfl:    diazepam (VALIUM) 5 MG tablet, Take 0.5 to 1 tab every 8 hours as needed for anxiety., Disp: , Rfl:    FLUoxetine (PROZAC) 20 MG capsule, Take by mouth., Disp: , Rfl:    lisinopril (ZESTRIL) 2.5 MG tablet, Take 1 tablet (2.5 mg total) by mouth daily., Disp: 30 tablet, Rfl: 5   lisinopril (ZESTRIL) 2.5 MG tablet, Take 1 tablet (2.5 mg total) by mouth once daily,  Disp: 30 tablet, Rfl: 5   lisinopril (ZESTRIL) 2.5 MG tablet, Take 1 tablet (2.5 mg total) by mouth daily., Disp: 30 tablet, Rfl: 5   lisinopril (ZESTRIL) 2.5 MG tablet, Take 1 tablet (2.5 mg total) by mouth daily., Disp: 30 tablet, Rfl: 5   metoprolol succinate (TOPROL-XL) 25 MG 24 hr tablet, Take 1 tablet (25 mg total) by mouth once daily., Disp: 30 tablet, Rfl: 5   metoprolol succinate (TOPROL-XL) 25 MG 24 hr tablet, Take 1 tablet (25 mg total) by mouth once daily, Disp: 30 tablet, Rfl: 5   metoprolol succinate (TOPROL-XL) 25 MG 24 hr tablet, Take 1 tablet (25 mg total) by mouth daily., Disp: 30 tablet, Rfl: 5   metoprolol succinate (TOPROL-XL) 25 MG 24 hr tablet, Take 1 tablet (25 mg total) by mouth daily., Disp: 30 tablet, Rfl: 5   metoprolol succinate (TOPROL-XL) 25 MG 24 hr tablet, Take 1 tablet (25 mg total) by mouth once daily, Disp: 30 tablet, Rfl: 5   nicotine (NICODERM CQ - DOSED IN MG/24 HOURS) 21 mg/24hr patch, Place 1 patch (21 mg total) onto the skin daily., Disp: , Rfl:    prasugrel (EFFIENT) 10 MG TABS tablet, Take 1 tablet (10 mg total) by mouth once daily., Disp: 30 tablet, Rfl: 5   prasugrel (EFFIENT) 10 MG TABS tablet, Take 1 tablet (10 mg total) by mouth  once daily, Disp: 30 tablet, Rfl: 5   prasugrel (EFFIENT) 10 MG TABS tablet, Take 1 tablet (10 mg total) by mouth daily., Disp: 30 tablet, Rfl: 5   prasugrel (EFFIENT) 10 MG TABS tablet, Take 1 tablet (10 mg total) by mouth daily., Disp: 30 tablet, Rfl: 5   prasugrel (EFFIENT) 10 MG TABS tablet, Take 1 tablet (10 mg total) by mouth once daily, Disp: 30 tablet, Rfl: 5   spironolactone (ALDACTONE) 25 MG tablet, Take 0.5 tablets (12.5 mg total) by mouth daily., Disp: 30 tablet, Rfl: 0  Past Medical History: Past Medical History:  Diagnosis Date   Anxiety    Depression    Headache    Neck pain     Tobacco Use: Social History   Tobacco Use  Smoking Status Every Day   Current packs/day: 1.00   Average packs/day: 1  pack/day for 15.0 years (15.0 ttl pk-yrs)   Types: Cigarettes  Smokeless Tobacco Never    Labs: Review Flowsheet       Latest Ref Rng & Units 05/21/2023  Labs for ITP Cardiac and Pulmonary Rehab  Cholestrol 0 - 200 mg/dL 409   LDL (calc) 0 - 99 mg/dL 94   HDL-C >81 mg/dL 32   Trlycerides <191 mg/dL 478   Hemoglobin G9F 4.8 - 5.6 % 5.4      Exercise Target Goals: Exercise Program Goal: Individual exercise prescription set using results from initial 6 min walk test and THRR while considering  patient's activity barriers and safety.   Exercise Prescription Goal: Initial exercise prescription builds to 30-45 minutes a day of aerobic activity, 2-3 days per week.  Home exercise guidelines will be given to patient during program as part of exercise prescription that the participant will acknowledge.   Education: Aerobic Exercise: - Group verbal and visual presentation on the components of exercise prescription. Introduces F.I.T.T principle from ACSM for exercise prescriptions.  Reviews F.I.T.T. principles of aerobic exercise including progression. Written material given at graduation.   Education: Resistance Exercise: - Group verbal and visual presentation on the components of exercise prescription. Introduces F.I.T.T principle from ACSM for exercise prescriptions  Reviews F.I.T.T. principles of resistance exercise including progression. Written material given at graduation.    Education: Exercise & Equipment Safety: - Individual verbal instruction and demonstration of equipment use and safety with use of the equipment. Flowsheet Row Cardiac Rehab from 06/13/2023 in [facility] Cardiac and Pulmonary Rehab  Date 06/13/23  Educator [facility]  Instruction Review Code 1- Verbalizes Understanding       Education: Exercise Physiology & General Exercise Guidelines: - Group verbal and written instruction with models to review the exercise physiology of the cardiovascular system and associated  critical values. Provides general exercise guidelines with specific guidelines to those with heart or lung disease.    Education: Flexibility, Balance, Mind/Body Relaxation: - Group verbal and visual presentation with interactive activity on the components of exercise prescription. Introduces F.I.T.T principle from ACSM for exercise prescriptions. Reviews F.I.T.T. principles of flexibility and balance exercise training including progression. Also discusses the mind body connection.  Reviews various relaxation techniques to help reduce and manage stress (i.e. Deep breathing, progressive muscle relaxation, and visualization). Balance handout provided to take home. Written material given at graduation.   Activity Barriers & Risk Stratification:  Activity Barriers & Cardiac Risk Stratification - 06/13/23 1535       Activity Barriers & Cardiac Risk Stratification   Activity Barriers None    Cardiac Risk Stratification Moderate  6 Minute Walk:  6 Minute Walk     Row Name 06/13/23 1527 10/19/23 1730       6 Minute Walk   Phase Initial Discharge    Distance 1280 feet 1590 feet    Distance % Change -- 24 %    Distance Feet Change -- 310 ft    Walk Time 6 minutes 6 minutes    # of Rest Breaks 0 0    MPH 2.42 3.01    METS 4.3 4.89    RPE 9 12    Perceived Dyspnea  0 1    VO2 Peak 15.1 17.11    Symptoms No No    Resting HR 70 bpm 76 bpm    Resting BP 112/62 140/78    Resting Oxygen Saturation  99 % 96 %    Exercise Oxygen Saturation  during 6 min walk 98 % 91 %    Max Ex. HR 88 bpm 91 bpm    Max Ex. BP 146/70 140/68    2 Minute Post BP 120/70 120/70             Oxygen Initial Assessment:   Oxygen Re-Evaluation:   Oxygen Discharge (Final Oxygen Re-Evaluation):   Initial Exercise Prescription:  Initial Exercise Prescription - 06/13/23 1500       Date of Initial Exercise RX and Referring Provider   Date 06/13/23    Referring Provider Rita Ohara      Oxygen   Maintain Oxygen Saturation 88% or higher      Treadmill   MPH 2.7    Grade 1    Minutes 15    METs 4.3      NuStep   Level 3    SPM 80    Minutes 15    METs 4.3      Elliptical   Level 1    Speed 3    Minutes 15    METs 4.3      REL-XR   Level 3    Speed 50    Minutes 15    METs 4.3      T5 Nustep   Level 3    SPM 80    Minutes 15    METs 4.3      Prescription Details   Frequency (times per week) 3    Duration Progress to 30 minutes of continuous aerobic without signs/symptoms of physical distress      Intensity   THRR 40-80% of Max Heartrate 111-153    Ratings of Perceived Exertion 11-13    Perceived Dyspnea 0-4      Progression   Progression Continue to progress workloads to maintain intensity without signs/symptoms of physical distress.      Resistance Training   Training Prescription Yes    Weight 4    Reps 10-15             Perform Capillary Blood Glucose checks as needed.  Exercise Prescription Changes:   Exercise Prescription Changes     Row Name 06/13/23 1500 06/30/23 1400 07/06/23 1700 07/12/23 1400 07/27/23 1400     Response to Exercise   Blood Pressure (Admit) 112/62 120/60 -- 108/72 120/70   Blood Pressure (Exercise) 146/70 150/80 -- 146/70 142/70   Blood Pressure (Exit) 120/70 110/70 -- 104/70 104/64   Heart Rate (Admit) 70 bpm 69 bpm -- 80 bpm 86 bpm   Heart Rate (Exercise) 88 bpm 122 bpm -- 111 bpm 127 bpm   Heart  Rate (Exit) 71 bpm 87 bpm -- 89 bpm 91 bpm   Oxygen Saturation (Admit) 99 % -- -- -- --   Oxygen Saturation (Exercise) 98 % -- -- -- --   Oxygen Saturation (Exit) 98 % -- -- -- --   Rating of Perceived Exertion (Exercise) 9 13 -- 13 13   Perceived Dyspnea (Exercise) 0 -- -- 0 0   Symptoms none none -- none none   Comments 6 MWT results First full week of exercise -- -- --   Duration -- Continue with 30 min of aerobic exercise without signs/symptoms of physical distress. -- Progress to 30  minutes of  aerobic without signs/symptoms of physical distress Progress to 30 minutes of  aerobic without signs/symptoms of physical distress   Intensity -- THRR unchanged THRR unchanged THRR unchanged THRR unchanged     Progression   Progression -- Continue to progress workloads to maintain intensity without signs/symptoms of physical distress. Continue to progress workloads to maintain intensity without signs/symptoms of physical distress. Continue to progress workloads to maintain intensity without signs/symptoms of physical distress. Continue to progress workloads to maintain intensity without signs/symptoms of physical distress.   Average METs -- 3.84 3.84 3.75 3.93     Resistance Training   Training Prescription -- Yes Yes Yes Yes   Weight -- 4 lb 4 lb 8 lb 8 lb   Reps -- 10-15 10-15 10-15 10-15     Interval Training   Interval Training -- No No No No     Treadmill   MPH -- 2.7 2.7 3 4    Grade -- 1 1 0.5 0.5   Minutes -- 15 15 15 15    METs -- 3.44 3.44 3.5 4.34     NuStep   Level -- 5 5 -- 5   Minutes -- 15 15 -- 15   METs -- 4.9 4.9 -- 4.6     REL-XR   Level -- 5 5 7 5    Minutes -- 15 15 15 15    METs -- 4.8 4.8 -- --     T5 Nustep   Level -- -- -- -- 5   Minutes -- -- -- -- 15   METs -- -- -- -- 3.9     Biostep-RELP   Level -- -- -- 4 --   Minutes -- -- -- 15 --   METs -- -- -- 4 --     Home Exercise Plan   Plans to continue exercise at -- -- Home (comment)  walking and stationary bike Home (comment)  walking and stationary bike Home (comment)  walking and stationary bike   Frequency -- -- Add 2 additional days to program exercise sessions. Add 2 additional days to program exercise sessions. Add 2 additional days to program exercise sessions.   Initial Home Exercises Provided -- -- 07/06/23 07/06/23 07/06/23     Oxygen   Maintain Oxygen Saturation -- 88% or higher 88% or higher 88% or higher 88% or higher    Row Name 08/11/23 1200 08/25/23 0700 09/07/23 1100  09/20/23 1500 10/05/23 0800     Response to Exercise   Blood Pressure (Admit) 122/76 120/70 112/62 104/60 118/66   Blood Pressure (Exit) 106/60 100/60 114/60 106/60 98/58   Heart Rate (Admit) 69 bpm 73 bpm 93 bpm 80 bpm 83 bpm   Heart Rate (Exercise) 121 bpm 93 bpm 106 bpm 120 bpm 103 bpm   Heart Rate (Exit) 91 bpm 86 bpm 90 bpm 95 bpm 80 bpm  Oxygen Saturation (Admit) 98 % 98 % 97 % 96 % 96 %   Oxygen Saturation (Exercise) 93 % 96 % 92 % 92 % 95 %   Oxygen Saturation (Exit) 93 % 96 % 96 % 93 % 97 %   Rating of Perceived Exertion (Exercise) 13 15 13 14 13    Perceived Dyspnea (Exercise) 0 -- 0 0 0   Symptoms none leg pain none none none   Duration Progress to 30 minutes of  aerobic without signs/symptoms of physical distress Continue with 30 min of aerobic exercise without signs/symptoms of physical distress. Continue with 30 min of aerobic exercise without signs/symptoms of physical distress. Continue with 30 min of aerobic exercise without signs/symptoms of physical distress. Continue with 30 min of aerobic exercise without signs/symptoms of physical distress.   Intensity THRR unchanged THRR unchanged THRR unchanged THRR unchanged THRR unchanged     Progression   Progression Continue to progress workloads to maintain intensity without signs/symptoms of physical distress. Continue to progress workloads to maintain intensity without signs/symptoms of physical distress. Continue to progress workloads to maintain intensity without signs/symptoms of physical distress. Continue to progress workloads to maintain intensity without signs/symptoms of physical distress. Continue to progress workloads to maintain intensity without signs/symptoms of physical distress.   Average METs 4.28 3.25 3.82 3.97 4.65     Resistance Training   Training Prescription Yes Yes Yes Yes Yes   Weight 8 lb 8 lb 8 lb 8 lb 8 lb   Reps 10-15 10-15 10-15 10-15 10-15     Interval Training   Interval Training No No No No No      Treadmill   MPH 3 3 3.6 3.6 3.3   Grade 0.5 0.5 1.5 1.5 0.5   Minutes 15 15 15 15 15    METs 3.5 3.5 4.5 4.5 3.75     NuStep   Level 7 -- 5 7 7    Minutes 15 -- 15 15 15    METs 6.3 -- 3.7 5.3 --     REL-XR   Level -- -- -- -- 8   Minutes -- -- -- -- 15   METs -- -- -- -- 6.7     T5 Nustep   Level 7  T6 6 6 6  --   Minutes 15 15 15 15  --   METs 4 3 3.6 3.6 --     Biostep-RELP   Level -- -- -- 5 --   Minutes -- -- -- 15 --   METs -- -- -- 3 --     Home Exercise Plan   Plans to continue exercise at Home (comment)  walking and stationary bike Home (comment)  walking and stationary bike Home (comment)  walking and stationary bike Home (comment)  walking and stationary bike Home (comment)  walking and stationary bike   Frequency Add 2 additional days to program exercise sessions. Add 2 additional days to program exercise sessions. Add 2 additional days to program exercise sessions. Add 2 additional days to program exercise sessions. Add 2 additional days to program exercise sessions.   Initial Home Exercises Provided 07/06/23 07/06/23 07/06/23 07/06/23 07/06/23     Oxygen   Maintain Oxygen Saturation 88% or higher 88% or higher 88% or higher 88% or higher 88% or higher    Row Name 10/20/23 1700             Response to Exercise   Blood Pressure (Admit) 104/62       Blood Pressure (Exit)  104/60       Heart Rate (Admit) 78 bpm       Heart Rate (Exercise) 102 bpm       Heart Rate (Exit) 88 bpm       Oxygen Saturation (Admit) 93 %       Oxygen Saturation (Exercise) 93 %       Oxygen Saturation (Exit) 94 %       Rating of Perceived Exertion (Exercise) 13       Symptoms none       Duration Continue with 30 min of aerobic exercise without signs/symptoms of physical distress.       Intensity THRR unchanged         Progression   Progression Continue to progress workloads to maintain intensity without signs/symptoms of physical distress.       Average METs 4.94          Resistance Training   Training Prescription Yes       Weight 8 lb       Reps 10-15         Interval Training   Interval Training No         Treadmill   MPH 3.5       Grade 0.5       Minutes 15       METs 3.92         NuStep   Level 7       Minutes 15       METs 5.4         REL-XR   Level 8       Minutes 15       METs 6.7         Home Exercise Plan   Plans to continue exercise at Home (comment)  walking and stationary bike       Frequency Add 2 additional days to program exercise sessions.       Initial Home Exercises Provided 07/06/23         Oxygen   Maintain Oxygen Saturation 88% or higher                Exercise Comments:   Exercise Comments     Row Name 06/15/23 1723           Exercise Comments First full day of exercise!  Patient was oriented to gym and equipment including functions, settings, policies, and procedures.  Patient's individual exercise prescription and treatment plan were reviewed.  All starting workloads were established based on the results of the 6 minute walk test done at initial orientation visit.  The plan for exercise progression was also introduced and progression will be customized based on patient's performance and goals.                Exercise Goals and Review:   Exercise Goals     Row Name 06/13/23 1531             Exercise Goals   Increase Physical Activity Yes       Intervention Provide advice, education, support and counseling about physical activity/exercise needs.;Develop an individualized exercise prescription for aerobic and resistive training based on initial evaluation findings, risk stratification, comorbidities and participant's personal goals.       Expected Outcomes Short Term: Attend rehab on a regular basis to increase amount of physical activity.;Long Term: Add in home exercise to make exercise part of routine and to increase amount of physical activity.;Long Term:  Exercising regularly at least 3-5  days a week.       Increase Strength and Stamina Yes       Intervention Provide advice, education, support and counseling about physical activity/exercise needs.;Develop an individualized exercise prescription for aerobic and resistive training based on initial evaluation findings, risk stratification, comorbidities and participant's personal goals.       Expected Outcomes Short Term: Increase workloads from initial exercise prescription for resistance, speed, and METs.;Short Term: Perform resistance training exercises routinely during rehab and add in resistance training at home;Long Term: Improve cardiorespiratory fitness, muscular endurance and strength as measured by increased METs and functional capacity ( )       Able to understand and use rate of perceived exertion (RPE) scale Yes       Intervention Provide education and explanation on how to use RPE scale       Expected Outcomes Short Term: Able to use RPE daily in rehab to express subjective intensity level;Long Term:  Able to use RPE to guide intensity level when exercising independently       Able to understand and use Dyspnea scale Yes       Intervention Provide education and explanation on how to use Dyspnea scale       Expected Outcomes Short Term: Able to use Dyspnea scale daily in rehab to express subjective sense of shortness of breath during exertion;Long Term: Able to use Dyspnea scale to guide intensity level when exercising independently       Knowledge and understanding of Target Heart Rate Range (THRR) Yes       Intervention Provide education and explanation of THRR including how the numbers were predicted and where they are located for reference       Expected Outcomes Short Term: Able to state/look up THRR;Long Term: Able to use THRR to govern intensity when exercising independently;Short Term: Able to use daily as guideline for intensity in rehab       Able to check pulse independently Yes       Intervention Provide  education and demonstration on how to check pulse in carotid and radial arteries.;Review the importance of being able to check your own pulse for safety during independent exercise       Expected Outcomes Short Term: Able to explain why pulse checking is important during independent exercise;Long Term: Able to check pulse independently and accurately       Understanding of Exercise Prescription Yes       Intervention Provide education, explanation, and written materials on patient's individual exercise prescription       Expected Outcomes Short Term: Able to explain program exercise prescription;Long Term: Able to explain home exercise prescription to exercise independently                Exercise Goals Re-Evaluation :  Exercise Goals Re-Evaluation     Row Name 06/15/23 1724 06/30/23 1429 07/06/23 1733 07/12/23 1456 07/27/23 1440     Exercise Goal Re-Evaluation   Exercise Goals Review Increase Physical Activity;Able to understand and use rate of perceived exertion (RPE) scale;Knowledge and understanding of Target Heart Rate Range (THRR);Understanding of Exercise Prescription;Increase Strength and Stamina;Able to check pulse independently Increase Physical Activity;Increase Strength and Stamina;Understanding of Exercise Prescription Increase Physical Activity;Able to understand and use rate of perceived exertion (RPE) scale;Knowledge and understanding of Target Heart Rate Range (THRR);Understanding of Exercise Prescription;Increase Strength and Stamina;Able to understand and use Dyspnea scale;Able to check pulse independently Increase Physical Activity;Increase Strength and Stamina;Understanding of  Exercise Prescription Increase Physical Activity;Increase Strength and Stamina;Understanding of Exercise Prescription   Comments Reviewed RPE and dyspnea scale, THR and program prescription with pt today.  Pt voiced understanding and was given a copy of goals to take home. Joakim is off to a good start  in the program. He did well on the treadmill at a speed of 2.7 mph with an incline of 1%. He also improved to level 5 on both the XR and T4 nustep in just his first week of rehab! We will continue to monitor his progress in the program. Reviewed home exercise with pt today.  Pt plans to walk and use a stationary bike for exercise.  Reviewed THR, pulse, RPE, sign and symptoms, pulse oximetery and when to call 911 or MD.  Also discussed weather considerations and indoor options.  Pt voiced understanding. Reita Cliche is doing well in rehab. He has been able to increase his handweights to 8lb, and level on the XR to level 7. He was also able to increase his speed on the treadmill from 2.7 mph to 3 mph. We will continue to monitor his progress in the program. Reita Cliche continues to do well in rehab. He has been able to increase his level on the T5 nustep from level 3 to level 5. He was also able to increase his speed on the treadmill from 3 to 4 mph. We will continue to monitor his progress in the program.   Expected Outcomes Short: Use RPE daily to regulate intensity.  Long: Follow program prescription in THR. Short: Continue to follow current exercise prescription. Long: Continue exercise to improve strength and stamina. Short: buy a stationary bike and add 1-2 days a week of exercise at home on off days of rehab. Long: become independent with exercise routine. Short: Continue to follow current exercise prescription. Long: Continue exercise to improve strength and stamina. Short: Continue to follow current exercise prescription. Long: Continue exercise to improve strength and stamina.    Row Name 08/11/23 1224 08/22/23 1730 08/25/23 0756 08/31/23 1747 09/07/23 1123     Exercise Goal Re-Evaluation   Exercise Goals Review Increase Physical Activity;Increase Strength and Stamina;Understanding of Exercise Prescription Increase Strength and Stamina;Understanding of Exercise Prescription Increase Strength and  Stamina;Understanding of Exercise Prescription;Increase Physical Activity Increase Physical Activity;Increase Strength and Stamina;Understanding of Exercise Prescription;Improve claudication pain tolerance and improve walking ability Increase Physical Activity;Increase Strength and Stamina;Understanding of Exercise Prescription   Comments Reita Cliche is doing well in rehab. He was able to increase to level 7 on both the T6 and T4 nustep. He did however slightly decline in workload on the treadmill, but we will continue to encourage and monitor his progression. Reita Cliche has had a hard time progressing with his workloads as he would like due to leg pain. He will be getting a stent put in leg tomorrow to help increase blood flow and hopefully reduce pain. He will be out the remainder of the week and plans to start back next week. He is also considering buying a treadmill for at home use to be able to increase to 4-5 days a week of exercise. Reita Cliche attended one session since the last review. He had a workload of a speed of with an incline of 0.5% on the treadmill and exercised at level 6 on the T5 nustep. Reita Cliche has been having issues with leg pain, which is interferring with wanting to progress his workloads, and he is getting a stent in his leg this week in hopes to reduce  pain and increase blood flow. We will continue to monitor his progress in the program. Reita Cliche had a stent placed in his leg last week due to a great amount of PAD pain he experienced often with activity. This was his first exercise session since the stent placement and he was happy to report that he was having no pain in his leg while walking. His PAD pain had been his main limiting factor in exercise progression. Now that he is not having that pain he should be able to progress more with his workloads to help him increase his strength and stamina with respect to his cardiovascular fitness. Reita Cliche is doing well in rehab. He is doing much better after his  stent procedure. He was able to increase his speed and his incline on the treadmill to 3. and 1.5% grade. We will continue to monitor his progress in the program.   Expected Outcomes Short: Increase treadmill workload to previous levels. Long: Continue exercise to increase strength and stamina. Short: get stent put in leg this week. Long: reduced leg pain will help Bobby to be able to increase his intensities more efficiently to make the gains he wants to see in his cardiovascular fitness. Short: Continue to progressively increase treadmill and nustep workloads once leg pain is reduced. Long: Continue exercise to improve strength and stamina. Short: begin to progress with workload intensities since pain in leg seems to have been resolved with stent placement. Long: contine to gain strength and stamina and become independent with exercise routine. Short: Continue to increase treadmill workloads. Long: Continue exercise to increase strength and stamina.    Row Name 09/20/23 1538 10/05/23 0816 10/12/23 1734 10/20/23 1746       Exercise Goal Re-Evaluation   Exercise Goals Review Increase Physical Activity;Increase Strength and Stamina;Understanding of Exercise Prescription Increase Physical Activity;Increase Strength and Stamina;Understanding of Exercise Prescription Increase Physical Activity;Able to understand and use rate of perceived exertion (RPE) scale;Increase Strength and Stamina Increase Physical Activity;Able to understand and use rate of perceived exertion (RPE) scale;Increase Strength and Stamina    Comments Reita Cliche continues to do well in rehab. He was able to increase his level on the biostep from 4 to 5. He was also able to maintain both his speed and incline on the treadmill, as well as his workload on the T4 nustep. We will continue to monitor his progress in the program. Reita Cliche is doing well in rehab. He was able to increase to level 8 on the XR, from level 5. He was also able to maintain a  level 7 on the T4 nustep. We will continue to monitor his progrss in the program. Reita Cliche reports that he recently bought a stationary bike to use at home and has also started walking with his daughter. He was encouraged to become consistent with home exercise now that he has his new piece of equipment. Reita Cliche continues to do well in rehab. He was able to increase his workload on the treadmill with a speed of 3.5 mph and maintained an incline of 0.5%. He also maintained level 8 on the XR and level 7 on the T4 nustep. He is due for his post and will graduate soon. We will continue to monitor his progress in the program.    Expected Outcomes Short: Continue to increase treadmill workloads. Long: Continue exercise to increase strength and stamina. Short: Continue to increase treadmill workloads. Long: Continue exercise to increase strength and stamina. Short: start using new stationary bike at home  for exercise. Long: maintain independent exercise routine upon graduation from cardiac rehab. Short: Improve on post . Long: Continue to increase overall METs and stamina.             Discharge Exercise Prescription (Final Exercise Prescription Changes):  Exercise Prescription Changes - 10/20/23 1700       Response to Exercise   Blood Pressure (Admit) 104/62    Blood Pressure (Exit) 104/60    Heart Rate (Admit) 78 bpm    Heart Rate (Exercise) 102 bpm    Heart Rate (Exit) 88 bpm    Oxygen Saturation (Admit) 93 %    Oxygen Saturation (Exercise) 93 %    Oxygen Saturation (Exit) 94 %    Rating of Perceived Exertion (Exercise) 13    Symptoms none    Duration Continue with 30 min of aerobic exercise without signs/symptoms of physical distress.    Intensity THRR unchanged      Progression   Progression Continue to progress workloads to maintain intensity without signs/symptoms of physical distress.    Average METs 4.94      Resistance Training   Training Prescription Yes    Weight 8 lb     Reps 10-15      Interval Training   Interval Training No      Treadmill   MPH 3.5    Grade 0.5    Minutes 15    METs 3.92      NuStep   Level 7    Minutes 15    METs 5.4      REL-XR   Level 8    Minutes 15    METs 6.7      Home Exercise Plan   Plans to continue exercise at Home (comment)   walking and stationary bike   Frequency Add 2 additional days to program exercise sessions.    Initial Home Exercises Provided 07/06/23      Oxygen   Maintain Oxygen Saturation 88% or higher             Nutrition:  Target Goals: Understanding of nutrition guidelines, daily intake of sodium 1500mg , cholesterol 200mg , calories 30% from fat and 7% or less from saturated fats, daily to have 5 or more servings of fruits and vegetables.  Education: All About Nutrition: -Group instruction provided by verbal, written material, interactive activities, discussions, models, and posters to present general guidelines for heart healthy nutrition including fat, fiber, MyPlate, the role of sodium in heart healthy nutrition, utilization of the nutrition label, and utilization of this knowledge for meal planning. Follow up email sent as well. Written material given at graduation. Flowsheet Row Cardiac Rehab from 06/13/2023 in [facility] Cardiac and Pulmonary Rehab  Education need identified 06/13/23       Biometrics:  Pre Biometrics - 06/13/23 1532       Pre Biometrics   Height 5\' 10"  (1.778 m)    Weight 179 lb 3.2 oz (81.3 kg)    Waist Circumference 35.5 inches    Hip Circumference 40.5 inches    Waist to Hip Ratio 0.88 %    BMI (Calculated) 25.71    Single Leg Stand 30 seconds             Post Biometrics - 10/19/23 1734        Post  Biometrics   Height 5\' 10"  (1.778 m)    Weight 174 lb 9.6 oz (79.2 kg)    Waist Circumference 34 inches    Hip Circumference  39 inches    Waist to Hip Ratio 0.87 %    BMI (Calculated) 25.05    Single Leg Stand 30 seconds             Nutrition  Therapy Plan and Nutrition Goals:  Nutrition Therapy & Goals - 06/13/23 1541       Nutrition Therapy   Diet Cardiac, Low Na    Protein (specify units) 90    Fiber 30 grams    Whole Grain Foods 3 servings    Saturated Fats 15 max. grams    Fruits and Vegetables 5 servings/day    Sodium 2 grams      Personal Nutrition Goals   Nutrition Goal Pair a protein or healthy fat with a complex carb    Personal Goal #2 Continue to limit fried foods and fatty meats.    Personal Goal #3 Continue to watch sodium and saturated fat    Comments Patient drinking 48oz of water consistently. Has made a lot of changes to cut back on sodium, processed foods and sugar. Educated on types of fats, sources, and how to read a label. Reviewed 24hr food recall, spoke on ways to meet nutritional needs when appetite is down. Encouraged more balanced plates with a protein or healthy fat paired with a carb. Provided handout with examples and built out several meals and snacks with foods he likes and will eat focusing on reducing saturated fat and pairing proteins and carbs together.      Intervention Plan   Intervention Prescribe, educate and counsel regarding individualized specific dietary modifications aiming towards targeted core components such as weight, hypertension, lipid management, diabetes, heart failure and other comorbidities.;Nutrition handout(s) given to patient.    Expected Outcomes Short Term Goal: Understand basic principles of dietary content, such as calories, fat, sodium, cholesterol and nutrients.;Short Term Goal: A plan has been developed with personal nutrition goals set during dietitian appointment.;Long Term Goal: Adherence to prescribed nutrition plan.             Nutrition Assessments:  MEDIFICTS Score Key: >=70 Need to make dietary changes  40-70 Heart Healthy Diet <= 40 Therapeutic Level Cholesterol Diet  Flowsheet Row Cardiac Rehab from 10/20/2023 in [facility] Cardiac and Pulmonary Rehab   Picture Your Plate Total Score on Discharge 63      Picture Your Plate Scores: <01 Unhealthy dietary pattern with much room for improvement. 41-50 Dietary pattern unlikely to meet recommendations for good health and room for improvement. 51-60 More healthful dietary pattern, with some room for improvement.  >60 Healthy dietary pattern, although there may be some specific behaviors that could be improved.    Nutrition Goals Re-Evaluation:  Nutrition Goals Re-Evaluation     Row Name 07/20/23 1732 08/22/23 1735 09/19/23 1717 10/12/23 1726       Goals   Comment Reita Cliche states that he has been mindful of sodium intake. He does not add salt at the table and has started reading food labels. He reports drinking plenty of fluids. He does not drink sugary drinks but mostly flavored water with low sugar and sodium content. He was for the most part been avoiding fried food. Reita Cliche states that he continues to watch what he is eating and maintain a heart healthy diet. He does not eat fast food or fried foods anymore. He also has stopped adding salt to food. Reita Cliche is working on staying away from [employer] and eating more vegetables. He states that food is very expensive and is  trying to find food for cheaper that is not bad for him. Bobby coninues to not eat fried foods or fast food. He reads food labels and watches the amount of sodium he intakes. He has started to incorporate more vegeables into his diet.    Expected Outcome Short: continue to read food labels and watch sodium intake. Long: maintain heart healthy diet to control cardiac risk factors. Short: read food labels and continue to reduce sodium intake. continue to not eat fast food or fried food. Long: maintain heart health diet long term to help control cardiac risk factors. Short: eat more vegetables. Long: maintain intake of healthier foods independently. Short: continue to read food labels and monitor sodium. Long: maintain heart healthy diet.       Personal Goal #2 Re-Evaluation   Personal Goal #2 Continue to limit fried foods and fatty meats. Continue to limit fried foods and fatty meats. -- --      Personal Goal #3 Re-Evaluation   Personal Goal #3 Continue to watch sodium and saturated fat Continue to watch sodium and saturated fat -- --             Nutrition Goals Discharge (Final Nutrition Goals Re-Evaluation):  Nutrition Goals Re-Evaluation - 10/12/23 1726       Goals   Comment Bobby coninues to not eat fried foods or fast food. He reads food labels and watches the amount of sodium he intakes. He has started to incorporate more vegeables into his diet.    Expected Outcome Short: continue to read food labels and monitor sodium. Long: maintain heart healthy diet.             Psychosocial: Target Goals: Acknowledge presence or absence of significant depression and/or stress, maximize coping skills, provide positive support system. Participant is able to verbalize types and ability to use techniques and skills needed for reducing stress and depression.   Education: Stress, Anxiety, and Depression - Group verbal and visual presentation to define topics covered.  Reviews how body is impacted by stress, anxiety, and depression.  Also discusses healthy ways to reduce stress and to treat/manage anxiety and depression.  Written material given at graduation.   Education: Sleep Hygiene -Provides group verbal and written instruction about how sleep can affect your health.  Define sleep hygiene, discuss sleep cycles and impact of sleep habits. Review good sleep hygiene tips.    Initial Review & Psychosocial Screening:  Initial Psych Review & Screening - 06/08/23 1422       Initial Review   Current issues with Current Stress Concerns    Source of Stress Concerns Financial      Family Dynamics   Good Support System? Yes   family and girlfriend     Barriers   Psychosocial barriers to participate in program There are no  identifiable barriers or psychosocial needs.;The patient should benefit from training in stress management and relaxation.      Screening Interventions   Interventions Encouraged to exercise;Provide feedback about the scores to participant;To provide support and resources with identified psychosocial needs    Expected Outcomes Short Term goal: Utilizing psychosocial counselor, staff and physician to assist with identification of specific Stressors or current issues interfering with healing process. Setting desired goal for each stressor or current issue identified.;Long Term Goal: Stressors or current issues are controlled or eliminated.;Short Term goal: Identification and review with participant of any Quality of Life or Depression concerns found by scoring the questionnaire.;Long Term goal: The participant improves  quality of Life and PHQ9 Scores as seen by post scores and/or verbalization of changes             Quality of Life Scores:   Quality of Life - 10/20/23 1719       Quality of Life   Select Quality of Life      Quality of Life Scores   Health/Function Pre 13.23 %    Health/Function Post 15.37 %    Health/Function % Change 16.18 %    Socioeconomic Pre 16.19 %    Socioeconomic Post 17.81 %    Socioeconomic % Change  10.01 %    Psych/Spiritual Pre 15.43 %    Psych/Spiritual Post 16.5 %    Psych/Spiritual % Change 6.93 %    Family Pre 24.8 %    Family Post 22.3 %    Family % Change -10.08 %    GLOBAL Pre 16 %    GLOBAL Post 17.14 %    GLOBAL % Change 7.13 %            Scores of 19 and below usually indicate a poorer quality of life in these areas.  A difference of  2-3 points is a clinically meaningful difference.  A difference of 2-3 points in the total score of the Quality of Life Index has been associated with significant improvement in overall quality of life, self-image, physical symptoms, and general health in studies assessing change in quality of  life.  PHQ-9: Review Flowsheet       10/20/2023 06/13/2023 09/25/2015 05/12/2015  Depression screen PHQ 2/9  Decreased Interest 1 1 3 2   Down, Depressed, Hopeless 1 1 3  0  PHQ - 2 Score 2 2 6 2   Altered sleeping 0 1 3 1   Tired, decreased energy 2 1 3 3   Change in appetite 1 0 0 1  Feeling bad or failure about yourself  1 0 1 3  Trouble concentrating 1 0 0 0  Moving slowly or fidgety/restless 0 0 0 0  Suicidal thoughts 0 0 0 0  PHQ-9 Score 7 4 13 10   Difficult doing work/chores Not difficult at all Somewhat difficult Somewhat difficult Not difficult at all   Interpretation of Total Score  Total Score Depression Severity:  1-4 = Minimal depression, 5-9 = Mild depression, 10-14 = Moderate depression, 15-19 = Moderately severe depression, 20-27 = Severe depression   Psychosocial Evaluation and Intervention:  Psychosocial Evaluation - 06/08/23 1437       Psychosocial Evaluation & Interventions   Interventions Encouraged to exercise with the program and follow exercise prescription;Stress management education;Relaxation education    Comments 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 He said this was a wake up call and has been making big lifestyle changes. He has his CDC, so he is on hold from working until he starts the program. He then will have to do a physical and undergo a stress test to be able to do his job fully. He states this has been a stressor being off of work since he supports himself. His family and girlfriend have been helpful in motivating him, but he is ready to get back to work and his normal routine. He has made a lot of diet changes and he has cut his smoking from 2 packs a day to about 5 cigarettes. He knows he needs to quit and is trying to. He has a history of anxiety and depression which he states is stable at  this time. His biggest concern in getting back to work at full capacity and learning how to live a heart healthy lifestyle    Expected Outcomes  Short: attend cardiac rehab for education and exercise. Long; Develop and maintain positive self care habits    Continue Psychosocial Services  Follow up required by staff             Psychosocial Re-Evaluation:  Psychosocial Re-Evaluation     Row Name 07/20/23 1747 08/11/23 1744 09/19/23 1720 10/12/23 1732       Psychosocial Re-Evaluation   Current issues with Current Stress Concerns Current Anxiety/Panic;Current Depression;Current Sleep Concerns Current Anxiety/Panic;Current Depression;Current Sleep Concerns Current Anxiety/Panic;Current Depression    Comments Patient reports that he has no changes in his current stress, sleep, or mental health. He does have a support system with his girlfriend and sister who he can relay on for support. Reita Cliche reports that he has been dealing with increased anxiety/panic and depression related to his heart and newly discovered blockage. It has been affecting his sleep as he wakes up in a panic and is having fears of death due to this diagnosis. I provided Reita Cliche with our mental health resources in the area as he is seeking out treatment for these psychosocial issues. Reita Cliche states his anxiety is down alot and able to wrap his mind arounds having a heart event. He is ready to exercise at home and is looking for equipment to help his health and his mood. Reita Cliche reports that he is now sleeping really good. He gets 8-9 hours of sleep a night. He reports no new changes or concerns in his stress and mental health. He states he has normal work stresses but is able to [employer] with daily life.    Expected Outcomes Short: continue to attend cardiac rehab to gain mental health benefits from exericse. Long: maintain good mental health habits. Short: Continue to attend cardiac rehab to gain mental health benefits from exercise and find counseling regarding his depression and anxiety. Long: Continue treatment related to depression and anxiety. Short: Continue to attend [facility]  regularly for regular exercise and social engagement. Long: Continue to improve symptoms and manage a positive mental state. Short: Continue to attend [facility] for mental health benefits of exercise. Long: Continue to improve symptoms and manage a positive mental state.    Interventions Encouraged to attend Cardiac Rehabilitation for the exercise Encouraged to attend Cardiac Rehabilitation for the exercise;Therapist referral  provided mental health resources Encouraged to attend Cardiac Rehabilitation for the exercise Encouraged to attend Cardiac Rehabilitation for the exercise    Continue Psychosocial Services  Follow up required by staff Follow up required by staff Follow up required by staff Follow up required by staff             Psychosocial Discharge (Final Psychosocial Re-Evaluation):  Psychosocial Re-Evaluation - 10/12/23 1732       Psychosocial Re-Evaluation   Current issues with Current Anxiety/Panic;Current Depression    Comments Reita Cliche reports that he is now sleeping really good. He gets 8-9 hours of sleep a night. He reports no new changes or concerns in his stress and mental health. He states he has normal work stresses but is able to [employer] with daily life.    Expected Outcomes Short: Continue to attend [facility] for mental health benefits of exercise. Long: Continue to improve symptoms and manage a positive mental state.    Interventions Encouraged to attend Cardiac Rehabilitation for the exercise    Continue Psychosocial Services  Follow up required by staff             Vocational Rehabilitation: Provide vocational rehab assistance to qualifying candidates.   Vocational Rehab Evaluation & Intervention:  Vocational Rehab - 06/13/23 1534       Initial Vocational Rehab Evaluation & Intervention   Assessment shows need for Vocational Rehabilitation No             Education: Education Goals: Education classes will be provided on a variety of topics geared  toward better understanding of heart health and risk factor modification. Participant will state understanding/return demonstration of topics presented as noted by education test scores.  Learning Barriers/Preferences:  Learning Barriers/Preferences - 06/08/23 1421       Learning Barriers/Preferences   Learning Barriers None    Learning Preferences None             General Cardiac Education Topics:  AED/CPR: - Group verbal and written instruction with the use of models to demonstrate the basic use of the AED with the basic ABC's of resuscitation.   Anatomy and Cardiac Procedures: - Group verbal and visual presentation and models provide information about basic cardiac anatomy and function. Reviews the testing methods done to diagnose heart disease and the outcomes of the test results. Describes the treatment choices: Medical Management, Angioplasty, or Coronary Bypass Surgery for treating various heart conditions including Myocardial Infarction, Angina, Valve Disease, and Cardiac Arrhythmias.  Written material given at graduation. Flowsheet Row Cardiac Rehab from 06/13/2023 in [facility] Cardiac and Pulmonary Rehab  Education need identified 06/13/23       Medication Safety: - Group verbal and visual instruction to review commonly prescribed medications for heart and lung disease. Reviews the medication, class of the drug, and side effects. Includes the steps to properly store meds and maintain the prescription regimen.  Written material given at graduation.   Intimacy: - Group verbal instruction through game format to discuss how heart and lung disease can affect sexual intimacy. Written material given at graduation..   Know Your Numbers and Heart Failure: - Group verbal and visual instruction to discuss disease risk factors for cardiac and pulmonary disease and treatment options.  Reviews associated critical values for Overweight/Obesity, Hypertension, Cholesterol, and Diabetes.   Discusses basics of heart failure: signs/symptoms and treatments.  Introduces Heart Failure Zone chart for action plan for heart failure.  Written material given at graduation. Flowsheet Row Cardiac Rehab from 06/13/2023 in [facility] Cardiac and Pulmonary Rehab  Education need identified 06/13/23       Infection Prevention: - Provides verbal and written material to individual with discussion of infection control including proper hand washing and proper equipment cleaning during exercise session. Flowsheet Row Cardiac Rehab from 06/13/2023 in [facility] Cardiac and Pulmonary Rehab  Date 06/13/23  Educator [facility]  Instruction Review Code 1- Verbalizes Understanding       Falls Prevention: - Provides verbal and written material to individual with discussion of falls prevention and safety. Flowsheet Row Cardiac Rehab from 06/13/2023 in [facility] Cardiac and Pulmonary Rehab  Date 06/13/23  Educator [facility]  Instruction Review Code 1- Verbalizes Understanding       Other: -Provides group and verbal instruction on various topics (see comments)   Knowledge Questionnaire Score:  Knowledge Questionnaire Score - 10/20/23 1719       Knowledge Questionnaire Score   Post Score 24/26             Core Components/Risk Factors/Patient Goals at Admission:  Personal Goals and Risk  Factors at Admission - 06/13/23 1526       Core Components/Risk Factors/Patient Goals on Admission    Weight Management Yes    Intervention Weight Management: Develop a combined nutrition and exercise program designed to reach desired caloric intake, while maintaining appropriate intake of nutrient and fiber, sodium and fats, and appropriate energy expenditure required for the weight goal.;Weight Management: Provide education and appropriate resources to help participant work on and attain dietary goals.;Weight Management/Obesity: Establish reasonable short term and long term weight goals.    Admit Weight 179 lb 3.2 oz (81.3 kg)     Goal Weight: Short Term 179 lb (81.2 kg)    Goal Weight: Long Term 179 lb (81.2 kg)    Expected Outcomes Short Term: Continue to assess and modify interventions until short term weight is achieved;Long Term: Adherence to nutrition and physical activity/exercise program aimed toward attainment of established weight goal;Weight Maintenance: Understanding of the daily nutrition guidelines, which includes 25-35% calories from fat, 7% or less cal from saturated fats, less than 200mg  cholesterol, less than 1.5gm of sodium, & 5 or more servings of fruits and vegetables daily;Understanding recommendations for meals to include 15-35% energy as protein, 25-35% energy from fat, 35-60% energy from carbohydrates, less than 200mg  of dietary cholesterol, 20-35 gm of total fiber daily;Understanding of distribution of calorie intake throughout the day with the consumption of 4-5 meals/snacks    Tobacco Cessation Yes    Number of packs per day 1/2    Intervention Assist the participant in steps to quit. Provide individualized education and counseling about committing to Tobacco Cessation, relapse prevention, and pharmacological support that can be provided by physician.;Education officer, environmental, assist with locating and accessing local/national Quit Smoking programs, and support quit date choice.    Expected Outcomes Short Term: Will demonstrate readiness to quit, by selecting a quit date.;Short Term: Will quit all tobacco product use, adhering to prevention of relapse plan.;Long Term: Complete abstinence from all tobacco products for at least 12 months from quit date.    Intervention Provide education and support for participant on nutrition & aerobic/resistive exercise along with prescribed medications to achieve LDL 70mg , HDL >40mg .    Expected Outcomes Short Term: Participant states understanding of desired cholesterol values and is compliant with medications prescribed. Participant is following exercise  prescription and nutrition guidelines.;Long Term: Cholesterol controlled with medications as prescribed, with individualized exercise RX and with personalized nutrition plan. Value goals: LDL < 70mg , HDL > 40 mg.             Education:Diabetes - Individual verbal and written instruction to review signs/symptoms of diabetes, desired ranges of glucose level fasting, after meals and with exercise. Acknowledge that pre and post exercise glucose checks will be done for 3 sessions at entry of program.   Core Components/Risk Factors/Patient Goals Review:   Goals and Risk Factor Review     Row Name 06/13/23 1526 07/20/23 1743 08/22/23 1738 09/19/23 1712 10/12/23 1729     Core Components/Risk Factors/Patient Goals Review   Personal Goals Review Tobacco Cessation Tobacco Cessation Tobacco Cessation;Lipids Tobacco Cessation Lipids;Tobacco Cessation   Review Reita Cliche is a current tobacco user. Intervention for tobacco cessation was provided at the initial medical review. He was asked about readiness to quit and reported he has cut back to a half pack a day and is considering the steps he needs to take to quit smoking . Patient was advised and educated about tobacco cessation using combination therapy, tobacco cessation classes, quit line,  and quit smoking apps. Patient demonstrated understanding of this material. Staff will continue to provide encouragement and follow up with the patient throughout the program. Reita Cliche reports that he is still smoking about 1/2 pack a day. He has in the past used the patch and been able to cut back more. He is looking possibly using the patch again to aid in trying to cut pack. Other smoking cessation options were discussed with patient. He is also planning to buy fake cigarettes to help with the hand to mouth habit of smoking. Reita Cliche reports that he continues to take all cholesterol meds as prescribed. He follows up with his doctor regularly for required lab work. Reita Cliche has cut  back to 8 cigarettes a day. He started by stopping smoking indoors, then his vehicle, and now he feels ready to quit. He did get the patch and plans to start using it tomorrow quit all together. Reita Cliche has cut back on his smoking. Today he has got down to 7 cigarettes in the last 24 hours. He is trying some candies and patches this week. He is working on quitting all together and wants to as soon as he can. Reita Cliche states that he takes all his cholesterol meds and follows up with his doctor to [facility] his lipid levels. He reports no concerns with this. Smoking cessation continues to be a struggle. He is back up to about 11-12 cigarettes a day. He recently talked to his doctor about using gum and lozenges and plans to start using them. He is also planning to use the Quit line for help with smoking cessation.   Expected Outcomes -- Short: start using the patch and fake cigarettes to help cut back on smoking. Long: become tobacco free. Short: start  using the patch and follow though with quit date, which is tomorrow 08/23/2023. Long: continue to control cholesterol and become tobacco free long term. Short: reduce smoking even further. Long: quit tobacco all together. Short: call the quit line. Long: quit tobacco use            Core Components/Risk Factors/Patient Goals at Discharge (Final Review):   Goals and Risk Factor Review - 10/12/23 1729       Core Components/Risk Factors/Patient Goals Review   Personal Goals Review Lipids;Tobacco Cessation    Review Reita Cliche states that he takes all his cholesterol meds and follows up with his doctor to [facility] his lipid levels. He reports no concerns with this. Smoking cessation continues to be a struggle. He is back up to about 11-12 cigarettes a day. He recently talked to his doctor about using gum and lozenges and plans to start using them. He is also planning to use the Quit line for help with smoking cessation.    Expected Outcomes Short: call the quit line. Long:  quit tobacco use             ITP Comments:  ITP Comments     Row Name 06/08/23 1429 06/13/23 1523 06/15/23 1723 06/29/23 1146 07/27/23 1006   ITP Comments Initial phone call completed. Diagnosis can be found in [facility] 10/12. EP Orientation scheduled for Monday 11/4 1:30.   Kailon is a current tobacco user. Intervention for tobacco cessation was provided at the initial medical review. He was asked about readiness to quit and reported that he is ready to start trying. He has reduced his smoking from 2 packs to 5 cigs a day . Patient was advised and educated about tobacco cessation using combination therapy, tobacco  cessation classes, quit line, and quit smoking apps. Patient demonstrated understanding of this material. Staff will continue to provide encouragement and follow up with the patient throughout the program. Completed and gym orientation. Initial ITP created and sent for review to Dr. Bethann Punches, Medical Director. Reita Cliche is a current tobacco user. Intervention for tobacco cessation was provided at the initial medical review. He was asked about readiness to quit and reported he has cut back to a half pack a day and is considering the steps he needs to take to quit smoking . Patient was advised and educated about tobacco cessation using combination therapy, tobacco cessation classes, quit line, and quit smoking apps. Patient demonstrated understanding of this material. Staff will continue to provide encouragement and follow up with the patient throughout the program. First full day of exercise!  Patient was oriented to gym and equipment including functions, settings, policies, and procedures.  Patient's individual exercise prescription and treatment plan were reviewed.  All starting workloads were established based on the results of the 6 minute walk test done at initial orientation visit.  The plan for exercise progression was also introduced and progression will be customized based on patient's  performance and goals. 30 Day review completed. Medical Director ITP review done, changes made as directed, and signed approval by Medical Director.    new to program 30 Day review completed. Medical Director ITP review done, changes made as directed, and signed approval by Medical Director.    Row Name 08/24/23 1313 09/21/23 1129 10/19/23 1029 10/27/23 1726     ITP Comments 30 Day review completed. Medical Director ITP review done, changes made as directed, and signed approval by Medical Director. 30 Day review completed. Medical Director ITP review done, changes made as directed, and signed approval by Medical Director. 30 Day review completed. Medical Director ITP review done, changes made as directed, and signed approval by Medical Director. Coen graduated today from  rehab with 35 sessions completed.  Details of the patient's exercise prescription and what He needs to do in order to continue the prescription and progress were discussed with patient.  Patient was given a copy of prescription and goals.  Patient verbalized understanding. Domnique plans to continue to exercise by walking and using stationary bike.             Comments: Discharge ITP

## 2023-11-26 LAB — COLOGUARD: COLOGUARD: POSITIVE — AB
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
## 2024-02-01 ENCOUNTER — Ambulatory Visit: Attending: Physical Medicine & Rehabilitation | Admitting: Physical Therapy

## 2024-02-01 ENCOUNTER — Encounter: Payer: Self-pay | Admitting: Physical Therapy

## 2024-02-01 DIAGNOSIS — M542 Cervicalgia: Secondary | ICD-10-CM | POA: Insufficient documentation

## 2024-02-01 DIAGNOSIS — M792 Neuralgia and neuritis, unspecified: Secondary | ICD-10-CM | POA: Insufficient documentation

## 2024-02-01 DIAGNOSIS — M6281 Muscle weakness (generalized): Secondary | ICD-10-CM | POA: Insufficient documentation

## 2024-02-07 ENCOUNTER — Ambulatory Visit: Admitting: Physical Therapy

## 2024-02-09 ENCOUNTER — Ambulatory Visit: Admitting: Physical Therapy

## 2024-02-15 ENCOUNTER — Ambulatory Visit: Admitting: Physical Therapy

## 2024-02-15 NOTE — Therapy (Deleted)
 OUTPATIENT PHYSICAL THERAPY TREATMENT   Patient Name: Joshua Leblanc. MRN: [id number] DOB:[birth year], 48 y.o., male Today's Date: 02/15/2024  END OF SESSION:    Past Medical History:  Diagnosis Date   Anxiety    Depression    Headache    Neck pain    Past Surgical History:  Procedure Laterality Date   CORONARY/GRAFT ACUTE MI REVASCULARIZATION N/A 05/21/2023   Procedure: Coronary/Graft Acute MI Revascularization;  Surgeon: Ammon Blunt, MD;  Location: [facility] INV[facility]IVE [facility];  Service: Cardiovascular;  Laterality: N/A;   LEFT HEART CATH AND CORONARY ANGIOGRAPHY N/A 05/21/2023   Procedure: LEFT HEART CATH AND CORONARY ANGIOGRAPHY;  Surgeon: Ammon Blunt, MD;  Location: [facility] INV[facility]IVE CV [facility];  Service: Cardiovascular;  Laterality: N/A;   MELANOMA EXCISION     ORIF CARPAL BONE FRACTURE     TONSILLECTOMY AND ADENOIDECTOMY     Patient Active Problem List   Diagnosis Date Noted   Tobacco abuse 06/01/2023   Hypokalemia 05/22/2023   Acute ST elevation myocardial infarction (STEMI) involving left anterior descending (LAD) coronary artery (HCC) 05/21/2023   STEMI (ST elevation myocardial infarction) (HCC) 05/21/2023   Tobacco abuse counseling 05/21/2023   Ischemic cardiomyopathy 05/21/2023   Herpes simplex virus infection 10/10/2018   Anxiety, generalized 09/25/2015    PCP: [facility]  REFERRING PROVIDER: Janith Drivers, DO  REFERRING DIAG: axial neck pain and disc protrusion at C6/7  THERAPY DIAG:  No diagnosis found.  Rationale for Evaluation and Treatment: Rehabilitation  ONSET DATE: January 2025  SUBJECTIVE:                                                                                                                                                                                                         PERTINENT HISTORY:  Patient is a 47 y.o. male who presents to outpatient physical therapy with a referral from Dr. Drivers Janith, DO  from [facility] with diagnosis of axial neck pain and disc protrusion at C6/7 and request for physical therapy evaluation and treatment using MDT/McKenzie approach. This patient's chief complaints consist of burning over his neck and upper back/shoulders, tingling in the hands starting when he was returning to prior level of function this January following a heart attack in October 2024, leading to the following functional deficits: difficulty with usual activities including personal care, lifting, reading, concentration, driving, sleeping, leisure activities, swimming, playing with kids, working, looking up, looking left. Relevant past medical history and comorbidities include the following: he has Anxiety, generalized; Herpes simplex virus infection; Active colon cancer (dx 3  weeks prior to PT eval and awaiting biopsy and staging at that time), Acute ST elevation myocardial infarction (STEMI) involving left anterior descending (LAD) coronary artery [facility]); STEMI (ST elevation myocardial infarction) (HCC); Tobacco abuse counseling; Ischemic cardiomyopathy; Hypokalemia; and Tobacco abuse on their problem list. he  has a past medical history of Anxiety, Depression, Headache, and Neck pain. he  has a past surgical history that includes Melanoma excision; ORIF carpal bone fracture; Tonsillectomy and adenoidectomy; Coronary/Graft Acute MI Revascularization (N/A, 05/21/2023); and LEFT HEART CATH AND CORONARY ANGIOGRAPHY (N/A, 05/21/2023). Patient denies hx of stroke, seizures, lung problems, diabetes, unexplained changes in bowel or bladder problems, osteoporosis, and spinal surgery. MI injured the bottom right of part of his heart. He has been building up his strength and endurance since.   SUBJECTIVE STATEMENT: ***   PAIN:  NPRS (0-10): Current: 3/10  Best: 0/10, Worst: 8/10. Present Symptoms: burning over the back of his neck, spreading to the posterior and anterior left shoulder and down to about T4;  does get intermittent B hand paresthesia, R > L (worse when neck pain is worse); gets infrequent headaches.    PRECAUTIONS: None  WEIGHT BEARING RESTRICTIONS: No  FALLS:  Has patient fallen in last 6 months? Did not ask  PATIENT GOALS: to feel better  OBJECTIVE  There were no vitals filed for this visit.  SELF-REPORTED FUNCTION Patient Specific Functional Scale (PSFS)  ***: *** ***: *** ***: *** Average: ***  Movement Loss Movement Loss Symptoms  Protrusion nil No symptoms  Flexion nil pulling  Retraction mod Increased right sided pain  Extension min Reproduced left symptoms  Lateral flexion R mod B neck pain at base of neck  Lateral flexion L mod B neck pain at base of neck  Rotation R nil Slight bilateral neck pain  Rotation L  min Increased left neck pain/symptoms    Repeated Test Movements Test Movement Symptom During Symptom After Mechanical Response Key Functional Test  Retraction in sitting  no effect no effect    Rep Retraction in Sitting AROM 2x10 decreases better No further effect after 2nd set increased ROM No further effect after 2nd set   Rep Retraction in sitting with self overpressure (OP) increases No worse Maybe a little worse   Rep Retraction in Sitting AROM 1x10 decreases no better Same as before     TREATMENT     Therapeutic exercise: therapeutic exercises that incorporate ONE parameter at one or more areas of the body to centralize symptoms, develop strength and endurance, range of motion, and flexibility required for successful completion of functional activities. Seated repeated cervical retraction with towel roll supporting low back:  3x10  Education on seated posture with trial of lumbar roll  Education on diagnosis, prognosis, POC, anatomy and physiology of current condition.   Education on HEP including handout   Pt required multimodal cuing for proper technique and to facilitate improved neuromuscular control, strength, range of  motion, and functional ability resulting in improved performance and form.   PATIENT EDUCATION:  Education details: Exercise purpose/form. Self management techniques. Education on diagnosis, prognosis, POC, anatomy and physiology of current condition. Education on HEP including handout. Person educated: Patient Education method: Explanation, Demonstration, Verbal cues, and Handouts Education comprehension: verbalized understanding, returned demonstration, and needs further education  HOME EXERCISE PROGRAM: Access Code: [facility] URL: [email or web address] Date: 02/01/2024 Prepared by: Camie Cleverly  Exercises - Seated Posture with Lumbar Roll  - Seated Cervical Retraction  - 15 reps - 1 second hold -  every 2-3 hours while awake frequency  ASSESSMENT:  CLINICAL IMPRESSION: ***  From Initial PT evaluation on 02/01/2024: Patient is a 48 y.o. male referred to outpatient physical therapy with a medical diagnosis of axial neck pain and disc protrusion at C6/7 and request for physical therapy evaluation and treatment using MDT/McKenzie approach who presents with signs and symptoms consistent with chronic neck pain with possible radiculopathy to the left UE. 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 He has decreased sensation to light touch and more weakness on the left UE than the right but it is not following a specific myotome or dermatome. Patient presents with significant pain, paresthesia, sensation, muscle performance (strength/power/endurance), ROM, joint stiffness, and activity tolerance impairments that are limiting ability to complete his usual activities including personal care, lifting, reading, concentration, driving, sleeping, leisure activities, swimming, playing with kids, working, looking up, looking left without difficulty. Patient will benefit from skilled physical therapy intervention to address  current body structure impairments and activity limitations to improve function and work towards goals set in current POC in order to return to prior level of function or maximal functional improvement.   Provisional MDT Classification: Derangement Directional Preference: extension/retraction      OBJECTIVE IMPAIRMENTS: decreased activity tolerance, decreased endurance, decreased knowledge of condition, decreased mobility, decreased ROM, decreased strength, hypomobility, increased muscle spasms, impaired UE functional use, and pain.   ACTIVITY LIMITATIONS: lifting, sleeping, hygiene/grooming, and  difficulty with usual activities including personal care, lifting, reading, concentration, driving, sleeping, leisure activities, swimming, playing with kids, working, looking up, looking left  PARTICIPATION LIMITATIONS: interpersonal relationship, driving, community activity, and occupation  PERSONAL FACTORS: Past/current experiences, Time since onset of injury/illness/exacerbation, and 3+ comorbidities:  Anxiety, generalized; Herpes simplex virus infection; Active colon cancer (dx 3 weeks prior to PT eval and awaiting biopsy and staging at that time), Acute ST elevation myocardial infarction (STEMI) involving left anterior descending (LAD) coronary artery [facility]); STEMI (ST elevation myocardial infarction) (HCC); Tobacco abuse counseling; Ischemic cardiomyopathy; Hypokalemia; and Tobacco abuse on their problem list. he  has a past medical history of Anxiety, Depression, Headache, and Neck pain. he  has a past surgical history that includes Melanoma excision; ORIF carpal bone fracture; Tonsillectomy and adenoidectomy; Coronary/Graft Acute MI Revascularization (05/21/2023); and LEFT HEART CATH AND CORONARY ANGIOGRAPHY (05/21/2023).  are also affecting patient's functional outcome.   REHAB POTENTIAL: Good  CLINICAL DECISION MAKING: Stable/uncomplicated  EVALUATION COMPLEXITY: Low   GOALS: Goals  reviewed with patient? No  SHORT TERM GOALS: Target date: 02/15/2024  Patient will be independent with initial home exercise program for self-management of symptoms. Baseline: Initial HEP provided at IE (02/01/24); Goal status: In progress   LONG TERM GOALS: Target date: 04/25/2024  Patient will be independent with a long-term home exercise program for self-management of symptoms.  Baseline: Initial HEP to be provided at visit 2 as appropriate (02/01/24); Goal status: In progress  2.  Patient will demonstrate improved Neck Disability Index (NDI) to equal or less than 10% to demonstrate improvement in overall condition and self-reported functional ability.  Baseline: 46% (02/01/24); Goal status: In progress  3.  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  Baseline: limited and painful (02/01/24); Goal status: In progress  4.  Patient will improve L UE MMT to equal or greater than right UE strength or at least 4+/5 to improve is ability to  perform self care and plaly with his children.  Baseline: as low as 4/5 (02/01/24); Goal status: In progress  5.  Patient will demonstrate improvement in Patient Specific Functional Scale (PSFS) of equal or greater than 8/10 points to reflect clinically significant improvement in patient's most valued functional activities. Baseline: to be measured at visit 2 as appropriate (02/01/24); Goal status: In progress  6.  Patient will report NPRS equal or less than 3/10 during functional activities during the last 2 weeks to improve their abilitly to complete community, work and/or recreational activities with less limitation. Baseline: 8/10 (02/01/24); Goal status: In progress    PLAN:  PT FREQUENCY: 1-2x/week  PT DURATION: 8-12 weeks  PLANNED INTERVENTIONS: 97164- PT Re-evaluation, 97750- Physical Performance Testing,  97110-Therapeutic exercises, 97530- Therapeutic activity, [id number]- Neuromuscular re-education, 97535- Self Care, 02859- Manual therapy, [id number]- Electrical stimulation (unattended), 97012- Traction (mechanical), 20560 (1-2 muscles), 20561 (3+ muscles)- Dry Needling, Joint mobilization, Spinal mobilization, Cryotherapy, and Moist heat  PLAN FOR NEXT SESSION: Updated HEP as appropriate, continue to assess and attempt to confirm MDT classification and add progressions of force or change direction of force as needed. Progress through stages of reduction of derangement, maintenance of reduction, recovery of function, and prophylaxis.   PRINCIPLES OF MANAGEMENT Education: HEP, avoiding protrusion, seated posture with towel roll Exercise Type: cervical retraction AROM  Frequency: 1x15  or until plateau every 2-3 hours while awake, use lumbar roll whenever sitting.     Camie SAUNDERS. Juli, PT, DPT, Cert. MDT 02/15/24, 11:01 AM  [facility] [address] [place], [place] [address] P: [phone] I F: [phone]

## 2024-02-20 ENCOUNTER — Ambulatory Visit: Attending: Physical Medicine & Rehabilitation

## 2024-02-20 DIAGNOSIS — M6281 Muscle weakness (generalized): Secondary | ICD-10-CM | POA: Insufficient documentation

## 2024-02-20 DIAGNOSIS — M792 Neuralgia and neuritis, unspecified: Secondary | ICD-10-CM | POA: Insufficient documentation

## 2024-02-20 DIAGNOSIS — M542 Cervicalgia: Secondary | ICD-10-CM | POA: Insufficient documentation

## 2024-02-20 NOTE — Therapy (Signed)
 OUTPATIENT PHYSICAL THERAPY TREATMENT   Patient Name: Joshua Leblanc. MRN: [id number] DOB:Aug 12, 1975, 48 y.o., male Today's Date: 02/20/2024  END OF SESSION:  PT End of Session - 02/20/24 1802     Visit Number 2    Number of Visits 17    Date for PT Re-Evaluation 04/25/24    Authorization Type [employer] reporting period from 02/01/2024    Authorization Time Period VL 20 per cal yr    Authorization - Visit Number 1    Authorization - Number of Visits 20    Progress Note Due on Visit 10    PT Start Time 1800    PT Stop Time 1830    PT Time Calculation (min) 30 min    Activity Tolerance Patient tolerated treatment well;No increased pain    Behavior During Therapy WFL for tasks assessed/performed           Past Medical History:  Diagnosis Date   Anxiety    Depression    Headache    Neck pain    Past Surgical History:  Procedure Laterality Date   CORONARY/GRAFT ACUTE MI REVASCULARIZATION N/A 05/21/2023   Procedure: Coronary/Graft Acute MI Revascularization;  Surgeon: Ammon Blunt, MD;  Location: [facility];  Service: Cardiovascular;  Laterality: N/A;   LEFT HEART CATH AND CORONARY ANGIOGRAPHY N/A 05/21/2023   Procedure: LEFT HEART CATH AND CORONARY ANGIOGRAPHY;  Surgeon: Ammon Blunt, MD;  Location: [facility];  Service: Cardiovascular;  Laterality: N/A;   MELANOMA EXCISION     ORIF CARPAL BONE FRACTURE     TONSILLECTOMY AND ADENOIDECTOMY     Patient Active Problem List   Diagnosis Date Noted   Tobacco abuse 06/01/2023   Hypokalemia 05/22/2023   Acute ST elevation myocardial infarction (STEMI) involving left anterior descending (LAD) coronary artery (HCC) 05/21/2023   STEMI (ST elevation myocardial infarction) (HCC) 05/21/2023   Tobacco abuse counseling 05/21/2023   Ischemic cardiomyopathy 05/21/2023   Herpes simplex virus infection 10/10/2018   Anxiety, generalized 09/25/2015    PCP: [facility]  REFERRING PROVIDER: Janith Drivers, DO  REFERRING DIAG: axial neck pain and disc protrusion at C6/7  THERAPY DIAG:  Cervicalgia  Neuralgia and neuritis  Muscle weakness (generalized)  Rationale for Evaluation and Treatment: Rehabilitation  ONSET DATE: January 2025  SUBJECTIVE:                                                                                                                                                                                                         PERTINENT  HISTORY:  Patient is a 48 y.o. male who presents to outpatient physical therapy with a referral from Dr. Franky Ambrosia, DO from [facility] with diagnosis of axial neck pain and disc protrusion at C6/7 and request for physical therapy evaluation and treatment using MDT/McKenzie approach. This patient's chief complaints consist of burning over his neck and upper back/shoulders, tingling in the hands starting when he was returning to prior level of function this January following a heart attack in October 2024, leading to the following functional deficits: difficulty with usual activities including personal care, lifting, reading, concentration, driving, sleeping, leisure activities, swimming, playing with kids, working, looking up, looking left. Relevant past medical history and comorbidities include the following: he has Anxiety, generalized; Herpes simplex virus infection; Active colon cancer (dx 3 weeks prior to PT eval and awaiting biopsy and staging at that time), Acute ST elevation myocardial infarction (STEMI) involving left anterior descending (LAD) coronary artery [facility] STEMI (ST elevation myocardial infarction) (HCC); Tobacco abuse counseling; Ischemic cardiomyopathy; Hypokalemia; and Tobacco abuse on their problem list. he  has a past medical history of Anxiety, Depression, Headache, and Neck pain. he  has a past surgical history that includes Melanoma excision; ORIF carpal bone fracture;  Tonsillectomy and adenoidectomy; Coronary/Graft Acute MI Revascularization (N/A, 05/21/2023); and LEFT HEART CATH AND CORONARY ANGIOGRAPHY (N/A, 05/21/2023). Patient denies hx of stroke, seizures, lung problems, diabetes, unexplained changes in bowel or bladder problems, osteoporosis, and spinal surgery. MI injured the bottom right of part of his heart. He has been building up his strength and endurance since.   SUBJECTIVE STATEMENT: Pt has been ~90% compliant with recommendations for extension based HEP, but reports no considerable changes to symptoms.  PAIN:  NPRS (0-10): None on arrival, intermittent burning in left upper traps area still, unpredictable.   PRECAUTIONS: None  WEIGHT BEARING RESTRICTIONS: No  FALLS:  Has patient fallen in last 6 months? Did not ask  PATIENT GOALS: to feel better  OBJECTIVE  There were no vitals filed for this visit.  Movement Loss Movement Loss Symptoms  Protrusion nil No symptoms  Flexion nil pulling  Retraction mod Increased right sided pain  Extension min Reproduced left symptoms  Lateral flexion R mod B neck pain at base of neck  Lateral flexion L mod B neck pain at base of neck  Rotation R nil Slight bilateral neck pain  Rotation L  min Increased left neck pain/symptoms    Repeated Test Movements Test Movement Symptom During Symptom After Mechanical Response Key Functional Test  Retraction in sitting  no effect no effect    Rep Retraction in Sitting AROM 2x10 decreases better No further effect after 2nd set increased ROM No further effect after 2nd set   Rep Retraction in sitting with self overpressure (OP) increases No worse Maybe a little worse   Rep Retraction in Sitting AROM 1x10 decreases no better Same as before     TODAY'S TREATMENT 02/20/24  -supine on double pillow, slight flexion cervical retraction to neutral 10x5secH   -supine cervical traction on towel roll in neutral to avoid pinch 90sec -supine on double pillow,  slight flexion cervical retraction to neutral 10x5secH   -supine cervical traction on towel roll in neutral to avoid pinch 90sec -supine on double pillow, slight flexion cervical retraction to neutral 10x5secH    -manual traction 2x30sec in slight extension  -quadruped cervical extension + cervical A/ROM 1x15   -longitudinal half roller pec major T stretch 3x60sec  -thoracic towel roll stretch  T6 c sledbar overhead 2x20   -repeat spine extension over chair back x15 for HEP add on   -seated horizontal row 20lb AW 1x15, then repeat with black band for HEP add on x15   PATIENT EDUCATION:  Education details: Exercise purpose/form. Self management techniques. Education on diagnosis, prognosis, POC, anatomy and physiology of current condition. Education on HEP including handout. Person educated: Patient Education method: Explanation, Demonstration, Verbal cues, and Handouts Education comprehension: verbalized understanding, returned demonstration, and needs further education  HOME EXERCISE PROGRAM: Access Code: [facility] URL: [email or web address] Date: 02/20/2024 Prepared by: Peggye Linear  Exercises - Seated Posture with Lumbar Roll  - Seated Cervical Retraction  - 15 reps - 1 second hold - every 2-3 hours while awake frequency - Thoracic Extension on Towel or 1/2 Foam Roll   - 2 x daily - 7 x weekly - 2 sets - 20 reps - Seated Thoracic Extension Arms Overhead  - 2 x daily - 7 x weekly - 2 sets - 20 reps - Standing Row with Anchored Resistance  - 1 x daily - 7 x weekly - 3 sets - 10 reps  ASSESSMENT:  CLINICAL IMPRESSION: Pt returns to clinic on visit 2 after recent hiatus finding appointment times that worked better. Reviewed HEP from visit 1. No signficant updates made at this time. Increased level of MT extension this date without any notable effect, also began to incorporate interventions to improve thoracic extension given the laws of regional interdependence.  Patient will benefit from skilled physical therapy intervention to reduce deficits and impairments identified in evaluation, in order to reduce pain, improve quality of life, and maximize activity tolerance for ADL, IADL, and leisure/fitness. Physical therapy will help pt achieve long and short term goals of care.    OBJECTIVE IMPAIRMENTS: decreased activity tolerance, decreased endurance, decreased knowledge of condition, decreased mobility, decreased ROM, decreased strength, hypomobility, increased muscle spasms, impaired UE functional use, and pain.   ACTIVITY LIMITATIONS: lifting, sleeping, hygiene/grooming, and  difficulty with usual activities including personal care, lifting, reading, concentration, driving, sleeping, leisure activities, swimming, playing with kids, working, looking up, looking left  PARTICIPATION LIMITATIONS: interpersonal relationship, driving, community activity, and occupation  PERSONAL FACTORS: Past/current experiences, Time since onset of injury/illness/exacerbation, and 3+ comorbidities:  Anxiety, generalized; Herpes simplex virus infection; Active colon cancer (dx 3 weeks prior to PT eval and awaiting biopsy and staging at that time), Acute ST elevation myocardial infarction (STEMI) involving left anterior descending (LAD) coronary artery [facility]); STEMI (ST elevation myocardial infarction) (HCC); Tobacco abuse counseling; Ischemic cardiomyopathy; Hypokalemia; and Tobacco abuse on their problem list. he  has a past medical history of Anxiety, Depression, Headache, and Neck pain. he  has a past surgical history that includes Melanoma excision; ORIF carpal bone fracture; Tonsillectomy and adenoidectomy; Coronary/Graft Acute MI Revascularization (05/21/2023); and LEFT HEART CATH AND CORONARY ANGIOGRAPHY (05/21/2023).  are also affecting patient's functional outcome.   REHAB POTENTIAL: Good CLINICAL DECISION MAKING: Stable/uncomplicated EVALUATION COMPLEXITY:  Low  GOALS: Goals reviewed with patient? No  SHORT TERM GOALS: Target date: 02/15/2024  Patient will be independent with initial home exercise program for self-management of symptoms. Baseline: Initial HEP provided at IE (02/01/24); Goal status: In progress  LONG TERM GOALS: Target date: 04/25/2024  Patient will be independent with a long-term home exercise program for self-management of symptoms.  Baseline: Initial HEP to be provided at visit 2 as appropriate (02/01/24); Goal status: In progress  2.  Patient will demonstrate improved Neck Disability Index (NDI) to  equal or less than 10% to demonstrate improvement in overall condition and self-reported functional ability.  Baseline: 46% (02/01/24); Goal status: In progress  3.  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  Baseline: limited and painful (02/01/24); Goal status: In progress  4.  Patient will improve L UE MMT to equal or greater than right UE strength or at least 4+/5 to improve is ability to perform self care and plaly with his children.  Baseline: as low as 4/5 (02/01/24); Goal status: In progress  5.  Patient will demonstrate improvement in Patient Specific Functional Scale (PSFS) of equal or greater than 8/10 points to reflect clinically significant improvement in patient's most valued functional activities. Baseline: to be measured at visit 2 as appropriate (02/01/24); Goal status: In progress  6.  Patient will report NPRS equal or less than 3/10 during functional activities during the last 2 weeks to improve their abilitly to complete community, work and/or recreational activities with less limitation. Baseline: 8/10 (02/01/24); Goal status: In progress  PLAN:  PT FREQUENCY: 1-2x/week PT DURATION: 8-12 weeks PLANNED INTERVENTIONS: 97164- PT Re-evaluation, 97750- Physical Performance  Testing, 97110-Therapeutic exercises, 97530- Therapeutic activity, [id number]- Neuromuscular re-education, 97535- Self Care, [id number]- Manual therapy, [id number]- Electrical stimulation (unattended), 97012- Traction (mechanical), 20560 (1-2 muscles), 20561 (3+ muscles)- Dry Needling, Joint mobilization, Spinal mobilization, Cryotherapy, and Moist heat  PLAN FOR NEXT SESSION: Updated HEP as appropriate, continue to assess and attempt to confirm MDT classification and add progressions of force or change direction of force as needed. Progress through stages of reduction of derangement, maintenance of reduction, recovery of function, and prophylaxis.   PRINCIPLES OF MANAGEMENT Education: HEP, avoiding protrusion, seated posture with towel roll Exercise Type: cervical retraction AROM  Frequency: 1x15  or until plateau every 2-3 hours while awake, use lumbar roll whenever sitting.     6:05 PM, 02/20/24 Peggye JAYSON Linear, PT, DPT Physical Therapist - [place] [phone] (Office)

## 2024-02-22 ENCOUNTER — Ambulatory Visit

## 2024-02-22 ENCOUNTER — Ambulatory Visit: Admitting: Physical Therapy

## 2024-02-22 DIAGNOSIS — M542 Cervicalgia: Secondary | ICD-10-CM | POA: Diagnosis not present

## 2024-02-22 DIAGNOSIS — M792 Neuralgia and neuritis, unspecified: Secondary | ICD-10-CM

## 2024-02-22 DIAGNOSIS — M6281 Muscle weakness (generalized): Secondary | ICD-10-CM

## 2024-02-22 NOTE — Therapy (Signed)
 OUTPATIENT PHYSICAL THERAPY TREATMENT   Patient Name: Joshua Leblanc. MRN: [id number] DOB:22-Aug-1975, 48 y.o., male Today's Date: 02/22/2024  END OF SESSION:  PT End of Session - 02/22/24 1736     Visit Number 3    Number of Visits 17    Date for PT Re-Evaluation 04/25/24    Authorization Type [employer] reporting period from 02/01/2024    Authorization Time Period VL 20 per cal yr    Progress Note Due on Visit 10    PT Start Time 1730    PT Stop Time 1810    PT Time Calculation (min) 40 min    Activity Tolerance Patient tolerated treatment well;No increased pain    Behavior During Therapy WFL for tasks assessed/performed           Past Medical History:  Diagnosis Date   Anxiety    Depression    Headache    Neck pain    Past Surgical History:  Procedure Laterality Date   CORONARY/GRAFT ACUTE MI REVASCULARIZATION N/A 05/21/2023   Procedure: Coronary/Graft Acute MI Revascularization;  Surgeon: Ammon Blunt, MD;  Location: [facility] INV[facility]IVE [facility];  Service: Cardiovascular;  Laterality: N/A;   LEFT HEART CATH AND CORONARY ANGIOGRAPHY N/A 05/21/2023   Procedure: LEFT HEART CATH AND CORONARY ANGIOGRAPHY;  Surgeon: Ammon Blunt, MD;  Location: [facility] INV[facility]IVE CV [facility];  Service: Cardiovascular;  Laterality: N/A;   MELANOMA EXCISION     ORIF CARPAL BONE FRACTURE     TONSILLECTOMY AND ADENOIDECTOMY     Patient Active Problem List   Diagnosis Date Noted   Tobacco abuse 06/01/2023   Hypokalemia 05/22/2023   Acute ST elevation myocardial infarction (STEMI) involving left anterior descending (LAD) coronary artery (HCC) 05/21/2023   STEMI (ST elevation myocardial infarction) (HCC) 05/21/2023   Tobacco abuse counseling 05/21/2023   Ischemic cardiomyopathy 05/21/2023   Herpes simplex virus infection 10/10/2018   Anxiety, generalized 09/25/2015    PCP: [facility]  REFERRING PROVIDER: Janith Drivers, DO  REFERRING DIAG: axial neck pain and  disc protrusion at C6/7  THERAPY DIAG:  Cervicalgia  Neuralgia and neuritis  Muscle weakness (generalized)  Rationale for Evaluation and Treatment: Rehabilitation  ONSET DATE: January 2025  SUBJECTIVE:                                                                                                                                                                                                         PERTINENT HISTORY:  Patient is a 48 y.o. male who presents to outpatient physical therapy with a referral  from Dr. Franky Ambrosia, DO from [facility] with diagnosis of axial neck pain and disc protrusion at C6/7 and request for physical therapy evaluation and treatment using MDT/McKenzie approach. This patient's chief complaints consist of burning over his neck and upper back/shoulders, tingling in the hands starting when he was returning to prior level of function this January following a heart attack in October 2024, leading to the following functional deficits: difficulty with usual activities including personal care, lifting, reading, concentration, driving, sleeping, leisure activities, swimming, playing with kids, working, looking up, looking left. Relevant past medical history and comorbidities include the following: he has Anxiety, generalized; Herpes simplex virus infection; Active colon cancer (dx 3 weeks prior to PT eval and awaiting biopsy and staging at that time), Acute ST elevation myocardial infarction (STEMI) involving left anterior descending (LAD) coronary artery [facility]); STEMI (ST elevation myocardial infarction) (HCC); Tobacco abuse counseling; Ischemic cardiomyopathy; Hypokalemia; and Tobacco abuse on their problem list. he  has a past medical history of Anxiety, Depression, Headache, and Neck pain. he  has a past surgical history that includes Melanoma excision; ORIF carpal bone fracture; Tonsillectomy and adenoidectomy; Coronary/Graft Acute MI Revascularization (N/A,  05/21/2023); and LEFT HEART CATH AND CORONARY ANGIOGRAPHY (N/A, 05/21/2023). Patient denies hx of stroke, seizures, lung problems, diabetes, unexplained changes in bowel or bladder problems, osteoporosis, and spinal surgery. MI injured the bottom right of part of his heart. He has been building up his strength and endurance since.   SUBJECTIVE STATEMENT: Pt remains compliant with recommendations for extension based HEP. Also reports successful implementation of HEP additions for thoracic mobility. Pt reports he felts great waking up Tuesday morning (follow PT session) and has had no burning in his shoulder today, only some soreness in that area. He typically has burning there after his workday. T spine was sore after last session but reasonably so.   PAIN:  NPRS (0-10): No pain just soreness.  PRECAUTIONS: None WEIGHT BEARING RESTRICTIONS: No FALLS:  Has patient fallen in last 6 months? Did not ask PATIENT GOALS: to feel better  OBJECTIVE  There were no vitals filed for this visit.  Movement Loss Movement Loss Symptoms  Protrusion nil No symptoms  Flexion nil pulling  Retraction mod Increased right sided pain  Extension min Reproduced left symptoms  Lateral flexion R mod B neck pain at base of neck  Lateral flexion L mod B neck pain at base of neck  Rotation R nil Slight bilateral neck pain  Rotation L  min Increased left neck pain/symptoms    Repeated Test Movements Test Movement Symptom During Symptom After Mechanical Response Key Functional Test  Retraction in sitting  no effect no effect    Rep Retraction in Sitting AROM 2x10 decreases better No further effect after 2nd set increased ROM No further effect after 2nd set   Rep Retraction in sitting with self overpressure (OP) increases No worse Maybe a little worse   Rep Retraction in Sitting AROM 1x10 decreases no better Same as before     TODAY'S TREATMENT 02/22/24  -supine on double pillow, slight flexion cervical  retraction to neutral 12x5secH  (increased from 10 reps last session)  -manual cervical traction in extension x 30sec  -supine on double pillow, slight flexion cervical retraction to neutral,  12x5secH   *added in concurrent BUE elbow extension + scapular retraction  -manual cervical traction in extension x 30sec  -supine on double pillow, slight flexion cervical retraction to neutral with concurrent BUE elbow extension +  scapular retraction  12x5secH  -manual cervical traction in extension x 30sec  -quadruped cervical retraction then cervical extension A/ROM 1x12  -longitudinal half roller S2-C2: pec major goal post stretch with PVC 3x30sec  -thoracic towel roll stretch T6 c sledbar overhead 1x20 -seated OMEGA horizontal row machine 20lb 1x15 -thoracic towel roll stretch T6 c sledbar overhead 1x20 -standing OMEGA cable row 20lb 1x15  -seated repeated thoracic extension over chair back with silver physio ball in Y x15  -standing BUE BTB GHJ ER + scap retraction x12  -seated repeated thoracic extension over chair back with silver physio ball in Y x15  -standing overhead press with 22.5lb bar x12    PATIENT EDUCATION:  Education details: Exercise purpose/form. Self management techniques. Education on diagnosis, prognosis, POC, anatomy and physiology of current condition. Education on HEP including handout. Person educated: Patient Education method: Explanation, Demonstration, Verbal cues, and Handouts Education comprehension: verbalized understanding, returned demonstration, and needs further education  HOME EXERCISE PROGRAM: Access Code: [facility] URL: [email or web address] Date: 02/20/2024 Prepared by: Peggye Linear  Exercises - Seated Posture with Lumbar Roll  - Seated Cervical Retraction  - 15 reps - 1 second hold - every 2-3 hours while awake frequency - Thoracic Extension on Towel or 1/2 Foam Roll   - 2 x daily - 7 x weekly - 2 sets - 20 reps - Seated Thoracic  Extension Arms Overhead  - 2 x daily - 7 x weekly - 2 sets - 20 reps - Standing Row with Anchored Resistance  - 1 x daily - 7 x weekly - 3 sets - 10 reps  ASSESSMENT:  CLINICAL IMPRESSION: Very favorable response to advance in program thus far. Pt's symptoms have changed in a significant way. Increased volume of thoracic extension today and expanded on posterior chain loading in variation. Pt does very well with tactile and verbal cues. Patient will benefit from skilled physical therapy intervention to reduce deficits and impairments identified in evaluation, in order to reduce pain, improve quality of life, and maximize activity tolerance for ADL, IADL, and leisure/fitness. Physical therapy will help pt achieve long and short term goals of care.    OBJECTIVE IMPAIRMENTS: decreased activity tolerance, decreased endurance, decreased knowledge of condition, decreased mobility, decreased ROM, decreased strength, hypomobility, increased muscle spasms, impaired UE functional use, and pain.   ACTIVITY LIMITATIONS: lifting, sleeping, hygiene/grooming, and  difficulty with usual activities including personal care, lifting, reading, concentration, driving, sleeping, leisure activities, swimming, playing with kids, working, looking up, looking left  PARTICIPATION LIMITATIONS: interpersonal relationship, driving, community activity, and occupation  PERSONAL FACTORS: Past/current experiences, Time since onset of injury/illness/exacerbation, and 3+ comorbidities:  Anxiety, generalized; Herpes simplex virus infection; Active colon cancer (dx 3 weeks prior to PT eval and awaiting biopsy and staging at that time), Acute ST elevation myocardial infarction (STEMI) involving left anterior descending (LAD) coronary artery [facility]); STEMI (ST elevation myocardial infarction) (HCC); Tobacco abuse counseling; Ischemic cardiomyopathy; Hypokalemia; and Tobacco abuse on their problem list. he  has a past medical history of  Anxiety, Depression, Headache, and Neck pain. he  has a past surgical history that includes Melanoma excision; ORIF carpal bone fracture; Tonsillectomy and adenoidectomy; Coronary/Graft Acute MI Revascularization (05/21/2023); and LEFT HEART CATH AND CORONARY ANGIOGRAPHY (05/21/2023).  are also affecting patient's functional outcome.   REHAB POTENTIAL: Good CLINICAL DECISION MAKING: Stable/uncomplicated EVALUATION COMPLEXITY: Low  GOALS: Goals reviewed with patient? No  SHORT TERM GOALS: Target date: 02/15/2024  Patient will be independent with initial home exercise program for  self-management of symptoms. Baseline: Initial HEP provided at IE (02/01/24); Goal status: In progress  LONG TERM GOALS: Target date: 04/25/2024  Patient will be independent with a long-term home exercise program for self-management of symptoms.  Baseline: Initial HEP to be provided at visit 2 as appropriate (02/01/24); Goal status: In progress  2.  Patient will demonstrate improved Neck Disability Index (NDI) to equal or less than 10% to demonstrate improvement in overall condition and self-reported functional ability.  Baseline: 46% (02/01/24); Goal status: In progress  3.  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  Baseline: limited and painful (02/01/24); Goal status: In progress  4.  Patient will improve L UE MMT to equal or greater than right UE strength or at least 4+/5 to improve is ability to perform self care and plaly with his children.  Baseline: as low as 4/5 (02/01/24); Goal status: In progress  5.  Patient will demonstrate improvement in Patient Specific Functional Scale (PSFS) of equal or greater than 8/10 points to reflect clinically significant improvement in patient's most valued functional activities. Baseline: to be measured at visit 2 as appropriate  (02/01/24); Goal status: In progress  6.  Patient will report NPRS equal or less than 3/10 during functional activities during the last 2 weeks to improve their abilitly to complete community, work and/or recreational activities with less limitation. Baseline: 8/10 (02/01/24); Goal status: In progress  PLAN:  PT FREQUENCY: 1-2x/week PT DURATION: 8-12 weeks PLANNED INTERVENTIONS: 97164- PT Re-evaluation, 97750- Physical Performance Testing, 97110-Therapeutic exercises, 97530- Therapeutic activity, [id number]- Neuromuscular re-education, 97535- Self Care, [id number]- Manual therapy, [id number]- Electrical stimulation (unattended), 97012- Traction (mechanical), 20560 (1-2 muscles), 20561 (3+ muscles)- Dry Needling, Joint mobilization, Spinal mobilization, Cryotherapy, and Moist heat  PLAN FOR NEXT SESSION: Updated HEP as appropriate, continue to assess and attempt to confirm MDT classification and add progressions of force or change direction of force as needed. Progress through stages of reduction of derangement, maintenance of reduction, recovery of function, and prophylaxis.   PRINCIPLES OF MANAGEMENT Education: HEP, avoiding protrusion, seated posture with towel roll Exercise Type: cervical retraction AROM  Frequency: 1x15  or until plateau every 2-3 hours while awake, use lumbar roll whenever sitting.     5:37 PM, 02/22/24 Peggye JAYSON Linear, PT, DPT Physical Therapist - [place] [phone] (Office)

## 2024-02-27 ENCOUNTER — Ambulatory Visit: Admitting: Physical Therapy

## 2024-02-29 ENCOUNTER — Ambulatory Visit: Admitting: Physical Therapy

## 2024-03-05 ENCOUNTER — Ambulatory Visit: Admitting: Physical Therapy

## 2024-03-05 NOTE — Therapy (Unsigned)
 OUTPATIENT PHYSICAL THERAPY TREATMENT   Patient Name: Joshua Leblanc. MRN: [id number] DOB:21-Feb-1976, 48 y.o., male Today's Date: 03/05/2024  END OF SESSION:     Past Medical History:  Diagnosis Date   Anxiety    Depression    Headache    Neck pain    Past Surgical History:  Procedure Laterality Date   CORONARY/GRAFT ACUTE MI REVASCULARIZATION N/A 05/21/2023   Procedure: Coronary/Graft Acute MI Revascularization;  Surgeon: Ammon Blunt, MD;  Location: [facility] INVASIVE CV LAB;  Service: Cardiovascular;  Laterality: N/A;   LEFT HEART CATH AND CORONARY ANGIOGRAPHY N/A 05/21/2023   Procedure: LEFT HEART CATH AND CORONARY ANGIOGRAPHY;  Surgeon: Ammon Blunt, MD;  Location: [facility] INVASIVE CV LAB;  Service: Cardiovascular;  Laterality: N/A;   MELANOMA EXCISION     ORIF CARPAL BONE FRACTURE     TONSILLECTOMY AND ADENOIDECTOMY     Patient Active Problem List   Diagnosis Date Noted   Tobacco abuse 06/01/2023   Hypokalemia 05/22/2023   Acute ST elevation myocardial infarction (STEMI) involving left anterior descending (LAD) coronary artery (HCC) 05/21/2023   STEMI (ST elevation myocardial infarction) (HCC) 05/21/2023   Tobacco abuse counseling 05/21/2023   Ischemic cardiomyopathy 05/21/2023   Herpes simplex virus infection 10/10/2018   Anxiety, generalized 09/25/2015    PCP: [facility]  REFERRING PROVIDER: Janith Drivers, DO  REFERRING DIAG: axial neck pain and disc protrusion at C6/7  THERAPY DIAG:  No diagnosis found.  Rationale for Evaluation and Treatment: Rehabilitation  ONSET DATE: January 2025  SUBJECTIVE:                                                                                                                                                                                                         PERTINENT HISTORY:  Patient is a 48 y.o. male who presents to outpatient physical therapy with a referral from Dr. Drivers Janith, DO  from [facility] with diagnosis of axial neck pain and disc protrusion at C6/7 and request for physical therapy evaluation and treatment using MDT/McKenzie approach. This patient's chief complaints consist of burning over his neck and upper back/shoulders, tingling in the hands starting when he was returning to prior level of function this January following a heart attack in October 2024, leading to the following functional deficits: difficulty with usual activities including personal care, lifting, reading, concentration, driving, sleeping, leisure activities, swimming, playing with kids, working, looking up, looking left. Relevant past medical history and comorbidities include the following: he has Anxiety, generalized; Herpes simplex virus infection; Active colon cancer (dx  3 weeks prior to PT eval and awaiting biopsy and staging at that time), Acute ST elevation myocardial infarction (STEMI) involving left anterior descending (LAD) coronary artery [facility]); STEMI (ST elevation myocardial infarction) (HCC); Tobacco abuse counseling; Ischemic cardiomyopathy; Hypokalemia; and Tobacco abuse on their problem list. he  has a past medical history of Anxiety, Depression, Headache, and Neck pain. he  has a past surgical history that includes Melanoma excision; ORIF carpal bone fracture; Tonsillectomy and adenoidectomy; Coronary/Graft Acute MI Revascularization (N/A, 05/21/2023); and LEFT HEART CATH AND CORONARY ANGIOGRAPHY (N/A, 05/21/2023). Patient denies hx of stroke, seizures, lung problems, diabetes, unexplained changes in bowel or bladder problems, osteoporosis, and spinal surgery. MI injured the bottom right of part of his heart. He has been building up his strength and endurance since.   SUBJECTIVE STATEMENT: Pt remains compliant with recommendations for extension based HEP. Also reports successful implementation of HEP additions for thoracic mobility. Pt reports he felts great waking up Tuesday morning  (follow PT session) and has had no burning in his shoulder today, only some soreness in that area. He typically has burning there after his workday. T spine was sore after last session but reasonably so.   PAIN:  NPRS (0-10): No pain just soreness.  PRECAUTIONS: None WEIGHT BEARING RESTRICTIONS: No FALLS:  Has patient fallen in last 6 months? Did not ask PATIENT GOALS: to feel better  OBJECTIVE   Movement Loss Movement Loss Symptoms  Protrusion nil No symptoms  Flexion nil pulling  Retraction mod Increased right sided pain  Extension min Reproduced left symptoms  Lateral flexion R mod B neck pain at base of neck  Lateral flexion L mod B neck pain at base of neck  Rotation R nil Slight bilateral neck pain  Rotation L  min Increased left neck pain/symptoms    Repeated Test Movements Test Movement Symptom During Symptom After Mechanical Response Key Functional Test  Retraction in sitting  no effect no effect    Rep Retraction in Sitting AROM 2x10 decreases better No further effect after 2nd set increased ROM No further effect after 2nd set   Rep Retraction in sitting with self overpressure (OP) increases No worse Maybe a little worse   Rep Retraction in Sitting AROM 1x10 decreases no better Same as before     TODAY'S TREATMENT 03/05/24  -supine on double pillow, slight flexion cervical retraction to neutral 12x5secH  (increased from 10 reps last session)  -manual cervical traction in extension x 30sec  -supine on double pillow, slight flexion cervical retraction to neutral,  12x5secH   *added in concurrent BUE elbow extension + scapular retraction  -manual cervical traction in extension x 30sec  -supine on double pillow, slight flexion cervical retraction to neutral with concurrent BUE elbow extension + scapular retraction  12x5secH  -manual cervical traction in extension x 30sec  -quadruped cervical retraction then cervical extension A/ROM 1x12  -longitudinal half  roller S2-C2: pec major goal post stretch with PVC 3x30sec  -thoracic towel roll stretch T6 c sledbar overhead 1x20 -seated OMEGA horizontal row machine 20lb 1x15 -thoracic towel roll stretch T6 c sledbar overhead 1x20 -standing OMEGA cable row 20lb 1x15  -seated repeated thoracic extension over chair back with silver physio ball in Y x15  -standing BUE BTB GHJ ER + scap retraction x12  -seated repeated thoracic extension over chair back with silver physio ball in Y x15  -standing overhead press with 22.5lb bar x12    PATIENT EDUCATION:  Education details: Exercise purpose/form. Self  management techniques. Education on diagnosis, prognosis, POC, anatomy and physiology of current condition. Education on HEP including handout. Person educated: Patient Education method: Explanation, Demonstration, Verbal cues, and Handouts Education comprehension: verbalized understanding, returned demonstration, and needs further education  HOME EXERCISE PROGRAM: Access Code: [facility] URL: [email or web address] Date: 02/20/2024 Prepared by: Peggye Linear  Exercises - Seated Posture with Lumbar Roll  - Seated Cervical Retraction  - 15 reps - 1 second hold - every 2-3 hours while awake frequency - Thoracic Extension on Towel or 1/2 Foam Roll   - 2 x daily - 7 x weekly - 2 sets - 20 reps - Seated Thoracic Extension Arms Overhead  - 2 x daily - 7 x weekly - 2 sets - 20 reps - Standing Row with Anchored Resistance  - 1 x daily - 7 x weekly - 3 sets - 10 reps  ASSESSMENT:  CLINICAL IMPRESSION: Very favorable response to advance in program thus far. Pt's symptoms have changed in a significant way. Increased volume of thoracic extension today and expanded on posterior chain loading in variation. Pt does very well with tactile and verbal cues. Patient will benefit from skilled physical therapy intervention to reduce deficits and impairments identified in evaluation, in order to reduce pain,  improve quality of life, and maximize activity tolerance for ADL, IADL, and leisure/fitness. Physical therapy will help pt achieve long and short term goals of care.    OBJECTIVE IMPAIRMENTS: decreased activity tolerance, decreased endurance, decreased knowledge of condition, decreased mobility, decreased ROM, decreased strength, hypomobility, increased muscle spasms, impaired UE functional use, and pain.   ACTIVITY LIMITATIONS: lifting, sleeping, hygiene/grooming, and  difficulty with usual activities including personal care, lifting, reading, concentration, driving, sleeping, leisure activities, swimming, playing with kids, working, looking up, looking left  PARTICIPATION LIMITATIONS: interpersonal relationship, driving, community activity, and occupation  PERSONAL FACTORS: Past/current experiences, Time since onset of injury/illness/exacerbation, and 3+ comorbidities:  Anxiety, generalized; Herpes simplex virus infection; Active colon cancer (dx 3 weeks prior to PT eval and awaiting biopsy and staging at that time), Acute ST elevation myocardial infarction (STEMI) involving left anterior descending (LAD) coronary artery [facility]); STEMI (ST elevation myocardial infarction) (HCC); Tobacco abuse counseling; Ischemic cardiomyopathy; Hypokalemia; and Tobacco abuse on their problem list. he  has a past medical history of Anxiety, Depression, Headache, and Neck pain. he  has a past surgical history that includes Melanoma excision; ORIF carpal bone fracture; Tonsillectomy and adenoidectomy; Coronary/Graft Acute MI Revascularization (05/21/2023); and LEFT HEART CATH AND CORONARY ANGIOGRAPHY (05/21/2023).  are also affecting patient's functional outcome.   REHAB POTENTIAL: Good CLINICAL DECISION MAKING: Stable/uncomplicated EVALUATION COMPLEXITY: Low  GOALS: Goals reviewed with patient? No  SHORT TERM GOALS: Target date: 02/15/2024  Patient will be independent with initial home exercise program for  self-management of symptoms. Baseline: Initial HEP provided at IE (02/01/24); Goal status: In progress  LONG TERM GOALS: Target date: 04/25/2024  Patient will be independent with a long-term home exercise program for self-management of symptoms.  Baseline: Initial HEP to be provided at visit 2 as appropriate (02/01/24); Goal status: In progress  2.  Patient will demonstrate improved Neck Disability Index (NDI) to equal or less than 10% to demonstrate improvement in overall condition and self-reported functional ability.  Baseline: 46% (02/01/24); Goal status: In progress  3.  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  Baseline: limited and painful (02/01/24); Goal status: In  progress  4.  Patient will improve L UE MMT to equal or greater than right UE strength or at least 4+/5 to improve is ability to perform self care and plaly with his children.  Baseline: as low as 4/5 (02/01/24); Goal status: In progress  5.  Patient will demonstrate improvement in Patient Specific Functional Scale (PSFS) of equal or greater than 8/10 points to reflect clinically significant improvement in patient's most valued functional activities. Baseline: to be measured at visit 2 as appropriate (02/01/24); Goal status: In progress  6.  Patient will report NPRS equal or less than 3/10 during functional activities during the last 2 weeks to improve their abilitly to complete community, work and/or recreational activities with less limitation. Baseline: 8/10 (02/01/24); Goal status: In progress  PLAN:  PT FREQUENCY: 1-2x/week PT DURATION: 8-12 weeks PLANNED INTERVENTIONS: 97164- PT Re-evaluation, 97750- Physical Performance Testing, 97110-Therapeutic exercises, 97530- Therapeutic activity, [id number]- Neuromuscular re-education, 97535- Self Care, 02859- Manual therapy, [id number]- Electrical  stimulation (unattended), 97012- Traction (mechanical), 20560 (1-2 muscles), 20561 (3+ muscles)- Dry Needling, Joint mobilization, Spinal mobilization, Cryotherapy, and Moist heat  PLAN FOR NEXT SESSION: Updated HEP as appropriate, continue to assess and attempt to confirm MDT classification and add progressions of force or change direction of force as needed. Progress through stages of reduction of derangement, maintenance of reduction, recovery of function, and prophylaxis.   PRINCIPLES OF MANAGEMENT Education: HEP, avoiding protrusion, seated posture with towel roll Exercise Type: cervical retraction AROM  Frequency: 1x15  or until plateau every 2-3 hours while awake, use lumbar roll whenever sitting.     Camie SAUNDERS. Juli, PT, DPT, Cert. MDT 03/05/24, 6:24 PM  [facility] [address] [place], [place] [address] P: [phone] I F: [phone]

## 2024-03-06 NOTE — Therapy (Signed)
 [facility] [address] [place], [address] Phone: [phone]   Fax:  [phone]  Patient Details  Name: Joshua Leblanc. MRN: [id number] Date of Birth: 1975-11-15 Referring Provider:  Janith Drivers, DO  Encounter Date: 03/05/2024  Patient arrived but had to leave early due to work related issue and PT was late giving only 10 min treatment time. Decided to cancel and reschedule. Patient's chart was opened but no treatment provided.   Camie SAUNDERS. Juli, PT, DPT, Cert. MDT 03/06/24, 6:09 PM  [facility] [address] [place], [place] [address] P: [phone] I F: [phone]

## 2024-03-07 ENCOUNTER — Ambulatory Visit: Admitting: Physical Therapy

## 2024-03-07 ENCOUNTER — Encounter: Payer: Self-pay | Admitting: Physical Therapy

## 2024-03-07 DIAGNOSIS — M6281 Muscle weakness (generalized): Secondary | ICD-10-CM

## 2024-03-07 DIAGNOSIS — M542 Cervicalgia: Secondary | ICD-10-CM

## 2024-03-07 DIAGNOSIS — M792 Neuralgia and neuritis, unspecified: Secondary | ICD-10-CM

## 2024-03-07 NOTE — Therapy (Signed)
 OUTPATIENT PHYSICAL THERAPY TREATMENT   Patient Name: Joshua Leblanc. MRN: [id number] DOB:[birth year], 48 y.o., male Today's Date: 03/07/2024  END OF SESSION:  PT End of Session - 03/07/24 2010     Visit Number 4    Number of Visits 17    Date for PT Re-Evaluation 04/25/24    Authorization Type [employer] reporting period from 02/01/2024    Authorization Time Period VL 20 per cal yr    Authorization - Visit Number 4    Authorization - Number of Visits 20    Progress Note Due on Visit 10    PT Start Time 1823    PT Stop Time 1907    PT Time Calculation (min) 44 min    Activity Tolerance Patient tolerated treatment well;No increased pain    Behavior During Therapy WFL for tasks assessed/performed            Past Medical History:  Diagnosis Date   Anxiety    Depression    Headache    Neck pain    Past Surgical History:  Procedure Laterality Date   CORONARY/GRAFT ACUTE MI REVASCULARIZATION N/A 05/21/2023   Procedure: Coronary/Graft Acute MI Revascularization;  Surgeon: Ammon Blunt, MD;  Location: [facility];  Service: Cardiovascular;  Laterality: N/A;   LEFT HEART CATH AND CORONARY ANGIOGRAPHY N/A 05/21/2023   Procedure: LEFT HEART CATH AND CORONARY ANGIOGRAPHY;  Surgeon: Ammon Blunt, MD;  Location: [facility];  Service: Cardiovascular;  Laterality: N/A;   MELANOMA EXCISION     ORIF CARPAL BONE FRACTURE     TONSILLECTOMY AND ADENOIDECTOMY     Patient Active Problem List   Diagnosis Date Noted   Tobacco abuse 06/01/2023   Hypokalemia 05/22/2023   Acute ST elevation myocardial infarction (STEMI) involving left anterior descending (LAD) coronary artery (HCC) 05/21/2023   STEMI (ST elevation myocardial infarction) (HCC) 05/21/2023   Tobacco abuse counseling 05/21/2023   Ischemic cardiomyopathy 05/21/2023   Herpes simplex virus infection 10/10/2018   Anxiety, generalized 09/25/2015    PCP: [facility]  REFERRING PROVIDER: Janith Drivers, DO  REFERRING DIAG: axial neck pain and disc protrusion at C6/7  THERAPY DIAG:  Cervicalgia  Neuralgia and neuritis  Muscle weakness (generalized)  Rationale for Evaluation and Treatment: Rehabilitation  ONSET DATE: January 2025  SUBJECTIVE:  PERTINENT HISTORY:  Patient is a 48 y.o. male who presents to outpatient physical therapy with a referral from Dr. Franky Ambrosia, DO from [facility] with diagnosis of axial neck pain and disc protrusion at C6/7 and request for physical therapy evaluation and treatment using MDT/McKenzie approach. This patient's chief complaints consist of burning over his neck and upper back/shoulders, tingling in the hands starting when he was returning to prior level of function this January following a heart attack in October 2024, leading to the following functional deficits: difficulty with usual activities including personal care, lifting, reading, concentration, driving, sleeping, leisure activities, swimming, playing with kids, working, looking up, looking left. Relevant past medical history and comorbidities include the following: he has Anxiety, generalized; Herpes simplex virus infection; Active colon cancer (dx 3 weeks prior to PT eval and awaiting biopsy and staging at that time), Acute ST elevation myocardial infarction (STEMI) involving left anterior descending (LAD) coronary artery [facility]); STEMI (ST elevation myocardial infarction) (HCC); Tobacco abuse counseling; Ischemic cardiomyopathy; Hypokalemia; and Tobacco abuse on their problem list. he  has a past medical history of Anxiety, Depression, Headache, and Neck pain. he  has a past surgical history that includes Melanoma excision; ORIF carpal bone fracture;  Tonsillectomy and adenoidectomy; Coronary/Graft Acute MI Revascularization (N/A, 05/21/2023); and LEFT HEART CATH AND CORONARY ANGIOGRAPHY (N/A, 05/21/2023). Patient denies hx of stroke, seizures, lung problems, diabetes, unexplained changes in bowel or bladder problems, osteoporosis, and spinal surgery. MI injured the bottom right of part of his heart. He has been building up his strength and endurance since.   SUBJECTIVE STATEMENT: Pt remains compliant with recommendations for extension based HEP. Also reports successful implementation of HEP additions for thoracic mobility. Pt reports he felts great waking up Tuesday morning (follow PT session) and has had no burning in his shoulder today, only some soreness in that area. He typically has burning there after his workday. T spine was sore after last session but reasonably so.   PAIN:  NPRS (0-10): 6-7/10 burning in the left posterior shoulder over.   PRECAUTIONS: None WEIGHT BEARING RESTRICTIONS: No FALLS:  Has patient fallen in last 6 months? Did not ask PATIENT GOALS: to feel better  OBJECTIVE  Movement Loss (from 02/01/24); Movement Loss Symptoms  Protrusion nil No symptoms  Flexion nil pulling  Retraction mod Increased right sided pain  Extension min Reproduced left symptoms  Lateral flexion R mod B neck pain at base of neck  Lateral flexion L mod B neck pain at base of neck  Rotation R nil Slight bilateral neck pain  Rotation L  min Increased left neck pain/symptoms    Repeated Motions Testing Test Movement Symptom During Symptom After Mechanical Response Key Functional Test  Supine Clinician Retraction with distraction. 1x6  no effect no effect    Rep Retraction extension and rotation with distraction in laying with Clinician 2x6 decreases Abolished burning in L UT during first set.     Seated repeated retraction with extension and rotation, 1x10 No effect Symptoms stay abolished, pt notes he has a knot at the L UT that he  has been trying to work out.     Thoracic extension 1x10 increases Burning coming back, but resolved before next exercise    Seated repeated retraction with extension and rotation, 1x10 No effect Symptoms abolished     TREATMENT Manual therapy: to reduce pain and tissue tension, improve range of motion, neuromodulation, in order to promote improved ability to complete functional activities. See clinician  procedures under repeated motions testing above  Therapeutic exercise: therapeutic exercises that incorporate ONE parameter at one or more areas of the body to centralize symptoms, develop strength and endurance, range of motion, and flexibility required for successful completion of functional activities.  Repeated motions (see non-clinician exercises under repeated motions testing above)  Education on HEP including handout   Neuromuscular Re-education: a technique or exercise performed with the goal of improving the level of communication between the body and the brain, such as for balance, motor control, muscle activation patterns, coordination, desensitization, quality of muscle contraction, proprioception, and/or kinesthetic sense needed for successful and safe completion of functional activities.   Seated B shoulder ER with scapular retraction and posterior tilt to improve mid/lower trap activation/endurance and inhibit UT 1x10 with 5 second holds with blueTB  Prone scapular retraction with slight hand lift to improve mid/lower trap activation/endurance and inhibit UT Palms down 1x20 with 5 second holds Palms up 1x20 with 5 second holds Fatiguing and shaking noted  Mild cuing to retract scapuale and keep shoulders away from ears  Prone lower trap and sarratus activation/endurance and inhibit UT with hands over head in diamond shape, moving into B shoulder ER with elbows on the mat 1x20 with 5 second holds Good ability to keep UT off  Pt required multimodal cuing for proper technique  and to facilitate improved neuromuscular control, strength, range of motion, and functional ability resulting in improved performance and form.    PATIENT EDUCATION:  Education details: Exercise purpose/form. Self management techniques. Education on HEP including handout. Person educated: Patient Education method: Explanation, Demonstration, Verbal cues, and Handouts Education comprehension: verbalized understanding, returned demonstration, and needs further education  HOME EXERCISE PROGRAM: Access Code: [facility] URL: [email or web address] Date: 02/20/2024 Prepared by: Camie Cleverly  Program Notes [email or web address]  Exercises - Seated Posture with Lumbar Roll  - Seated Cervical Retraction and Extension  - 4 x daily - 2 sets - 10 reps - no longer than a second or two hold - Prone Scapular Slide with Shoulder Extension  - 1 x daily - 2 sets - 20 reps - 5 seconds hold  HOME EXERCISE PROGRAM [[id number]]  Prone shoulder ER for lower traps -  Repeat 20 Repetitions, Hold 5 Seconds, Complete 2 Sets, Perform 1 Times a Day  ASSESSMENT:  CLINICAL IMPRESSION: Returned to MDT assessment and MDT based treatment with abolishment of symptoms using clinician procedure repeated retraction and extension with rotation and distraction. Patient was able to maintain improvements with seated patient version. He had slight return of symptoms with repeated thoracic extension so his HEP was updated to include the seated retraction extension and rotation exercise as a core reductive procedure. Also started working on lower periscapular muscle motor control and endurance with patient showing significant fatigue by end  of each set. Patient reported feeling much better by end of session. Suggest monitoring right UT tightness and checking UE reflexes next session to monitor improvement.  Patient also to bring pictures of his work station to help with ergonomic education. Patient  would benefit from continued management of limiting condition by skilled physical therapist to address remaining impairments and functional limitations to work towards stated goals and return to PLOF or maximal functional independence.     OBJECTIVE IMPAIRMENTS: decreased activity tolerance, decreased endurance, decreased knowledge of condition, decreased mobility, decreased ROM, decreased strength, hypomobility, increased muscle spasms, impaired UE functional use, and pain.   ACTIVITY LIMITATIONS: lifting, sleeping, hygiene/grooming, and  difficulty with usual activities including personal  care, lifting, reading, concentration, driving, sleeping, leisure activities, swimming, playing with kids, working, looking up, looking left  PARTICIPATION LIMITATIONS: interpersonal relationship, driving, community activity, and occupation  PERSONAL FACTORS: Past/current experiences, Time since onset of injury/illness/exacerbation, and 3+ comorbidities:  Anxiety, generalized; Herpes simplex virus infection; Active colon cancer (dx 3 weeks prior to PT eval and awaiting biopsy and staging at that time), Acute ST elevation myocardial infarction (STEMI) involving left anterior descending (LAD) coronary artery [facility]); STEMI (ST elevation myocardial infarction) (HCC); Tobacco abuse counseling; Ischemic cardiomyopathy; Hypokalemia; and Tobacco abuse on their problem list. he  has a past medical history of Anxiety, Depression, Headache, and Neck pain. he  has a past surgical history that includes Melanoma excision; ORIF carpal bone fracture; Tonsillectomy and adenoidectomy; Coronary/Graft Acute MI Revascularization (05/21/2023); and LEFT HEART CATH AND CORONARY ANGIOGRAPHY (05/21/2023).  are also affecting patient's functional outcome.   REHAB POTENTIAL: Good CLINICAL DECISION MAKING: Stable/uncomplicated EVALUATION COMPLEXITY: Low  GOALS: Goals reviewed with patient? No  SHORT TERM GOALS: Target date:  02/15/2024  Patient will be independent with initial home exercise program for self-management of symptoms. Baseline: Initial HEP provided at IE (02/01/24); Goal status: In progress  LONG TERM GOALS: Target date: 04/25/2024  Patient will be independent with a long-term home exercise program for self-management of symptoms.  Baseline: Initial HEP to be provided at visit 2 as appropriate (02/01/24); Goal status: In progress  2.  Patient will demonstrate improved Neck Disability Index (NDI) to equal or less than 10% to demonstrate improvement in overall condition and self-reported functional ability.  Baseline: 46% (02/01/24); Goal status: In progress  3.  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  Baseline: limited and painful (02/01/24); Goal status: In progress  4.  Patient will improve L UE MMT to equal or greater than right UE strength or at least 4+/5 to improve is ability to perform self care and plaly with his children.  Baseline: as low as 4/5 (02/01/24); Goal status: In progress  5.  Patient will demonstrate improvement in Patient Specific Functional Scale (PSFS) of equal or greater than 8/10 points to reflect clinically significant improvement in patient's most valued functional activities. Baseline: to be measured at visit 2 as appropriate (02/01/24); Goal status: In progress  6.  Patient will report NPRS equal or less than 3/10 during functional activities during the last 2 weeks to improve their abilitly to complete community, work and/or recreational activities with less limitation. Baseline: 8/10 (02/01/24); Goal status: In progress  PLAN:  PT FREQUENCY: 1-2x/week PT DURATION: 8-12 weeks PLANNED INTERVENTIONS: 97164- PT Re-evaluation, 97750- Physical Performance Testing, 97110-Therapeutic exercises, 97530- Therapeutic activity, [id number]  Neuromuscular re-education, 97535- Self Care, 02859- Manual therapy, [id number]- Electrical stimulation (unattended), 97012- Traction (mechanical), 20560 (1-2 muscles), 20561 (3+ muscles)- Dry Needling, Joint mobilization, Spinal mobilization, Cryotherapy, and Moist heat  PLAN FOR NEXT SESSION: Updated HEP as appropriate, continue to assess and attempt to confirm MDT classification and add progressions of force or change direction of force as needed. Progress through stages of reduction of derangement, maintenance of reduction, recovery of function, and prophylaxis.   PRINCIPLES OF MANAGEMENT Education: HEP, avoiding protrusion, seated posture with towel roll Exercise Type: cervical retraction AROM  Frequency: 1x15  or until plateau every 2-3 hours while awake, use lumbar roll whenever sitting.     Camie SAUNDERS. Juli, PT, DPT, Cert. MDT 03/07/24, 8:24 PM  [place]  [facility] [address] [place], [place] [address] P: [phone] I F: [phone]

## 2024-03-12 ENCOUNTER — Ambulatory Visit: Attending: Physical Medicine & Rehabilitation | Admitting: Physical Therapy

## 2024-03-12 ENCOUNTER — Encounter: Payer: Self-pay | Admitting: Physical Therapy

## 2024-03-12 DIAGNOSIS — M542 Cervicalgia: Secondary | ICD-10-CM | POA: Insufficient documentation

## 2024-03-12 DIAGNOSIS — M6281 Muscle weakness (generalized): Secondary | ICD-10-CM | POA: Diagnosis present

## 2024-03-12 DIAGNOSIS — M792 Neuralgia and neuritis, unspecified: Secondary | ICD-10-CM | POA: Diagnosis present

## 2024-03-12 NOTE — Therapy (Signed)
 OUTPATIENT PHYSICAL THERAPY TREATMENT   Patient Name: Joshua Leblanc. MRN: [id number] DOB:Aug 26, 1975, 48 y.o., male Today's Date: 03/12/2024  END OF SESSION:  PT End of Session - 03/12/24 1837     Visit Number 5    Number of Visits 17    Date for PT Re-Evaluation 04/25/24    Authorization Type [employer] reporting period from 02/01/2024    Authorization Time Period VL 20 per cal yr    Authorization - Visit Number 5    Authorization - Number of Visits 20    Progress Note Due on Visit 10    PT Start Time 1822    PT Stop Time 1907    PT Time Calculation (min) 45 min    Activity Tolerance Patient tolerated treatment well;No increased pain    Behavior During Therapy WFL for tasks assessed/performed             Past Medical History:  Diagnosis Date   Anxiety    Depression    Headache    Neck pain    Past Surgical History:  Procedure Laterality Date   CORONARY/GRAFT ACUTE MI REVASCULARIZATION N/A 05/21/2023   Procedure: Coronary/Graft Acute MI Revascularization;  Surgeon: Ammon Blunt, MD;  Location: [facility];  Service: Cardiovascular;  Laterality: N/A;   LEFT HEART CATH AND CORONARY ANGIOGRAPHY N/A 05/21/2023   Procedure: LEFT HEART CATH AND CORONARY ANGIOGRAPHY;  Surgeon: Ammon Blunt, MD;  Location: [facility];  Service: Cardiovascular;  Laterality: N/A;   MELANOMA EXCISION     ORIF CARPAL BONE FRACTURE     TONSILLECTOMY AND ADENOIDECTOMY     Patient Active Problem List   Diagnosis Date Noted   Tobacco abuse 06/01/2023   Hypokalemia 05/22/2023   Acute ST elevation myocardial infarction (STEMI) involving left anterior descending (LAD) coronary artery (HCC) 05/21/2023   STEMI (ST elevation myocardial infarction) (HCC) 05/21/2023   Tobacco abuse counseling 05/21/2023   Ischemic cardiomyopathy 05/21/2023   Herpes simplex virus infection 10/10/2018   Anxiety, generalized 09/25/2015    PCP: [facility]  REFERRING PROVIDER: Janith Drivers, DO  REFERRING DIAG: axial neck pain and disc protrusion at C6/7  THERAPY DIAG:  Cervicalgia  Neuralgia and neuritis  Muscle weakness (generalized)  Rationale for Evaluation and Treatment: Rehabilitation  ONSET DATE: January 2025  SUBJECTIVE:  PERTINENT HISTORY:  Patient is a 48 y.o. male who presents to outpatient physical therapy with a referral from Dr. Franky Ambrosia, DO from [facility] with diagnosis of axial neck pain and disc protrusion at C6/7 and request for physical therapy evaluation and treatment using MDT/McKenzie approach. This patient's chief complaints consist of burning over his neck and upper back/shoulders, tingling in the hands starting when he was returning to prior level of function this January following a heart attack in October 2024, leading to the following functional deficits: difficulty with usual activities including personal care, lifting, reading, concentration, driving, sleeping, leisure activities, swimming, playing with kids, working, looking up, looking left. Relevant past medical history and comorbidities include the following: he has Anxiety, generalized; Herpes simplex virus infection; Active colon cancer (dx 3 weeks prior to PT eval and awaiting biopsy and staging at that time), Acute ST elevation myocardial infarction (STEMI) involving left anterior descending (LAD) coronary artery [facility]); STEMI (ST elevation myocardial infarction) (HCC); Tobacco abuse counseling; Ischemic cardiomyopathy; Hypokalemia; and Tobacco abuse on their problem list. he  has a past medical history of Anxiety, Depression, Headache, and Neck pain. he  has a past surgical history that includes Melanoma excision; ORIF carpal bone fracture;  Tonsillectomy and adenoidectomy; Coronary/Graft Acute MI Revascularization (N/A, 05/21/2023); and LEFT HEART CATH AND CORONARY ANGIOGRAPHY (N/A, 05/21/2023). Patient denies hx of stroke, seizures, lung problems, diabetes, unexplained changes in bowel or bladder problems, osteoporosis, and spinal surgery. MI injured the bottom right of part of his heart. He has been building up his strength and endurance since.   SUBJECTIVE STATEMENT: Patient states his low back hurt bad on Thursday and Friday after the stretching in the clinic. The burning came back but he is not sure when it did after last PT session. He states it comes and goes. He mostly notices it when riding his bike, prolonged sitting like when he drove to [place] , and after a while of working where he is reaching overhead. He did not do any HEP since last PT session because his low back was so sore.   PAIN:  NPRS (0-10): 0/10 burning in the left posterior shoulder over.   PRECAUTIONS: None WEIGHT BEARING RESTRICTIONS: No FALLS:  Has patient fallen in last 6 months? Did not ask PATIENT GOALS: to feel better  OBJECTIVE  ULNT:  B median nerve equal, slight restriction bilaterally L (positive) ulnar nerve clearly irritated compared to R (normal), sensitive to head position Radial nerve: slightly less ROM on L (positive) compared to right (end range symptoms)   TREATMENT  Self-Care/Home Management Training: to educate patient in self care including ADL training, meal preparation, compensatory training, safety procedures/instructions, use of assistive technology devices or adaptive equipment.  Education on ergonomic set up of motorcycle including going out to the bike and observing pt siting on it in the parking lot. Reccommended adjusting handle bars forwards and using lumbar roll to help get center of gravity of shoulders behind thoracic fulcrum to reduce protraction of scapulae and over activation of UT.    Neuromuscular Re-education:  a technique or exercise performed with the goal of improving the level of communication between the body and the brain, such as for balance, motor control, muscle activation patterns, coordination, desensitization, quality of muscle contraction, proprioception, and/or kinesthetic sense needed for successful and safe completion of functional activities.   Circuit:  Prone scapular retraction with slight hand lift to improve mid/lower trap activation/endurance and inhibit UT Palms down 2x20 with 5 second holds  Palms up 2x20 with 5 second holds Fatiguing and shaking noted  Mild cuing to retract scapuale and keep shoulders away from ears  Prone lower trap and sarratus activation/endurance and inhibit UT with hands over head in diamond shape, moving into B shoulder ER with elbows on the mat 2x20 with 5 second holds Good ability to keep UT off  ULNT exam (see above)  Review of prone W scapular retraction after pt showed neural tension near ulnar nerve tension test. Pateint able to do 5 good reps with minimal compensation and slight improvement with cuing. Verbally asked him to substitute this in the HEP over the prone with hands over head exercise.   Pt required multimodal cuing for proper technique and to facilitate improved neuromuscular control, strength, range of motion, and functional ability resulting in improved performance and form.    PATIENT EDUCATION:  Education details: Exercise purpose/form. Self management techniques. Education on HEP including handout. Person educated: Patient Education method: Explanation, Demonstration, Verbal cues, and Handouts Education comprehension: verbalized understanding, returned demonstration, and needs further education  HOME EXERCISE PROGRAM: Access Code: [facility] URL: [email or web address] Date: 02/20/2024 Prepared by: Camie Cleverly  Program Notes [email or web address]  Exercises - Seated Posture with  Lumbar Roll  - Seated Cervical Retraction and Extension  - 4 x daily - 2 sets - 10 reps - no longer than a second or two hold - Prone Scapular Slide with Shoulder Extension  - 1 x daily - 2 sets - 20 reps - 5 seconds hold  HOME EXERCISE PROGRAM [[id number]]  Prone shoulder ER for lower traps -  Repeat 20 Repetitions, Hold 5 Seconds, Complete 2 Sets, Perform 1 Times a Day  ASSESSMENT:  CLINICAL IMPRESSION: Did not repeat repeated motions procedures today as patient was so sore after these last session that he did not do any HEP. He also continues to report intermittent pain, worst with riding his bike, prolonged sitting, and prolonged working overhead. He continues to demonstrate protracted scapulae and upper back rounding, but has ROM to move out of this. He also has limited motor control and endurance in intrascapular muscles, and likely would have decreased symptoms with improved scapular stability for overhead work. Continued with intrascapular muscle training and encouraged patient to continue this at home with modifications due to L ulnar nerve tension (educated to avoid it). Patient would benefit from continued management of limiting condition by skilled physical therapist to address remaining impairments and functional limitations to work towards stated goals and return to PLOF or maximal functional independence.     OBJECTIVE IMPAIRMENTS: decreased activity tolerance, decreased endurance, decreased knowledge of condition, decreased mobility, decreased ROM, decreased strength, hypomobility, increased muscle spasms, impaired UE functional use, and pain.   ACTIVITY LIMITATIONS: lifting, sleeping, hygiene/grooming, and  difficulty with usual activities including personal care, lifting, reading, concentration, driving, sleeping, leisure activities, swimming, playing with kids, working, looking up, looking left  PARTICIPATION LIMITATIONS: interpersonal relationship, driving, community activity, and  occupation  PERSONAL FACTORS: Past/current experiences, Time since onset of injury/illness/exacerbation, and 3+ comorbidities:  Anxiety, generalized; Herpes simplex virus infection; Active colon cancer (dx 3 weeks prior to PT eval and awaiting biopsy and staging at that time), Acute ST elevation myocardial infarction (STEMI) involving left anterior descending (LAD) coronary artery [facility]); STEMI (ST elevation myocardial infarction) (HCC); Tobacco abuse counseling; Ischemic cardiomyopathy; Hypokalemia; and Tobacco abuse on their problem list. he  has a past medical history of Anxiety, Depression, Headache, and Neck pain. he  has a past surgical history that  includes Melanoma excision; ORIF carpal bone fracture; Tonsillectomy and adenoidectomy; Coronary/Graft Acute MI Revascularization (05/21/2023); and LEFT HEART CATH AND CORONARY ANGIOGRAPHY (05/21/2023).  are also affecting patient's functional outcome.   REHAB POTENTIAL: Good CLINICAL DECISION MAKING: Stable/uncomplicated EVALUATION COMPLEXITY: Low  GOALS: Goals reviewed with patient? No  SHORT TERM GOALS: Target date: 02/15/2024  Patient will be independent with initial home exercise program for self-management of symptoms. Baseline: Initial HEP provided at IE (02/01/24); Goal status: In progress  LONG TERM GOALS: Target date: 04/25/2024  Patient will be independent with a long-term home exercise program for self-management of symptoms.  Baseline: Initial HEP to be provided at visit 2 as appropriate (02/01/24); Goal status: In progress  2.  Patient will demonstrate improved Neck Disability Index (NDI) to equal or less than 10% to demonstrate improvement in overall condition and self-reported functional ability.  Baseline: 46% (02/01/24); Goal status: In progress  3.  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   Baseline: limited and painful (02/01/24); Goal status: In progress  4.  Patient will improve L UE MMT to equal or greater than right UE strength or at least 4+/5 to improve is ability to perform self care and plaly with his children.  Baseline: as low as 4/5 (02/01/24); Goal status: In progress  5.  Patient will demonstrate improvement in Patient Specific Functional Scale (PSFS) of equal or greater than 8/10 points to reflect clinically significant improvement in patient's most valued functional activities. Baseline: to be measured at visit 2 as appropriate (02/01/24); Goal status: In progress  6.  Patient will report NPRS equal or less than 3/10 during functional activities during the last 2 weeks to improve their abilitly to complete community, work and/or recreational activities with less limitation. Baseline: 8/10 (02/01/24); Goal status: In progress  PLAN:  PT FREQUENCY: 1-2x/week PT DURATION: 8-12 weeks PLANNED INTERVENTIONS: 97164- PT Re-evaluation, 97750- Physical Performance Testing, 97110-Therapeutic exercises, 97530- Therapeutic activity, [id number]- Neuromuscular re-education, 97535- Self Care, 02859- Manual therapy, [id number]- Electrical stimulation (unattended), 97012- Traction (mechanical), 20560 (1-2 muscles), 20561 (3+ muscles)- Dry Needling, Joint mobilization, Spinal mobilization, Cryotherapy, and Moist heat  PLAN FOR NEXT SESSION: Updated HEP as appropriate, continue to assess and attempt to confirm MDT classification and add progressions of force or change direction of force as needed. Progress through stages of reduction of derangement, maintenance of reduction, recovery of function, and prophylaxis.   PRINCIPLES OF MANAGEMENT Education: HEP, avoiding protrusion, seated posture with towel roll Exercise Type: cervical retraction AROM  Frequency: 1x15  or until plateau every 2-3 hours while awake, use lumbar roll whenever sitting.     Camie SAUNDERS. Juli, PT, DPT, Cert.  MDT 03/12/24, 7:42 PM  [facility] [address] [place], [place] [address] P: [phone] I F: [phone]

## 2024-03-14 ENCOUNTER — Ambulatory Visit: Admitting: Physical Therapy

## 2024-03-19 ENCOUNTER — Ambulatory Visit: Admitting: Physical Therapy

## 2024-03-21 ENCOUNTER — Telehealth: Payer: Self-pay | Admitting: Physical Therapy

## 2024-03-21 ENCOUNTER — Ambulatory Visit: Admitting: Physical Therapy

## 2024-03-21 NOTE — Telephone Encounter (Addendum)
 Spoke with patient notifying patient of missed PT visit scheduled at 4:45pm today. He states he thought it was at 6:15pm. Offered 6:15pm since it was available but he declined, stating he was about to call and cancel because he does not feel like coming today.   Let patient know that with any no-show I am required to review our cancellation policy that after 2 no-shows we remove future visits from the schedule, they are responsible to calling in to reschedule, and they will only be able to schedule one appointment at a time. He acknowledged this. Also confirmed next scheduled PT visit on 04/05/24 at 6:15pm.   Camie SAUNDERS. Juli, PT, DPT 03/21/24, 5:20 PM  [facility] [address] [place], [place] [address] P: [phone] I F: [phone]

## 2024-04-05 ENCOUNTER — Ambulatory Visit: Admitting: Physical Therapy
